# Patient Record
Sex: Female | Born: 1935 | Race: White | Hispanic: No | Smoking: Never smoker
Health system: Southern US, Community
[De-identification: ages and names within clinical notes are randomized; demographics above are authoritative.]

## PROBLEM LIST (undated history)

## (undated) DIAGNOSIS — I517 Cardiomegaly: Secondary | ICD-10-CM

## (undated) DIAGNOSIS — Z8719 Personal history of other diseases of the digestive system: Secondary | ICD-10-CM

## (undated) DIAGNOSIS — K649 Unspecified hemorrhoids: Secondary | ICD-10-CM

## (undated) DIAGNOSIS — C189 Malignant neoplasm of colon, unspecified: Secondary | ICD-10-CM

## (undated) DIAGNOSIS — I5189 Other ill-defined heart diseases: Secondary | ICD-10-CM

## (undated) DIAGNOSIS — Z87442 Personal history of urinary calculi: Secondary | ICD-10-CM

## (undated) DIAGNOSIS — I639 Cerebral infarction, unspecified: Secondary | ICD-10-CM

## (undated) DIAGNOSIS — R112 Nausea with vomiting, unspecified: Secondary | ICD-10-CM

## (undated) DIAGNOSIS — M810 Age-related osteoporosis without current pathological fracture: Secondary | ICD-10-CM

## (undated) DIAGNOSIS — E119 Type 2 diabetes mellitus without complications: Secondary | ICD-10-CM

## (undated) DIAGNOSIS — I2510 Atherosclerotic heart disease of native coronary artery without angina pectoris: Secondary | ICD-10-CM

## (undated) DIAGNOSIS — C801 Malignant (primary) neoplasm, unspecified: Secondary | ICD-10-CM

## (undated) DIAGNOSIS — Z9889 Other specified postprocedural states: Secondary | ICD-10-CM

## (undated) DIAGNOSIS — Z7901 Long term (current) use of anticoagulants: Secondary | ICD-10-CM

## (undated) DIAGNOSIS — T8859XA Other complications of anesthesia, initial encounter: Secondary | ICD-10-CM

## (undated) DIAGNOSIS — G459 Transient cerebral ischemic attack, unspecified: Secondary | ICD-10-CM

## (undated) DIAGNOSIS — I219 Acute myocardial infarction, unspecified: Secondary | ICD-10-CM

## (undated) DIAGNOSIS — I38 Endocarditis, valve unspecified: Secondary | ICD-10-CM

## (undated) DIAGNOSIS — Z8619 Personal history of other infectious and parasitic diseases: Secondary | ICD-10-CM

## (undated) DIAGNOSIS — E039 Hypothyroidism, unspecified: Secondary | ICD-10-CM

## (undated) DIAGNOSIS — E785 Hyperlipidemia, unspecified: Secondary | ICD-10-CM

## (undated) DIAGNOSIS — I10 Essential (primary) hypertension: Secondary | ICD-10-CM

## (undated) DIAGNOSIS — K219 Gastro-esophageal reflux disease without esophagitis: Secondary | ICD-10-CM

## (undated) DIAGNOSIS — T4145XA Adverse effect of unspecified anesthetic, initial encounter: Secondary | ICD-10-CM

## (undated) HISTORY — PX: CATARACT EXTRACTION, BILATERAL: SHX1313

## (undated) HISTORY — PX: BREAST BIOPSY: SHX20

## (undated) HISTORY — PX: CORONARY ANGIOPLASTY WITH STENT PLACEMENT: SHX49

## (undated) HISTORY — PX: APPENDECTOMY: SHX54

## (undated) HISTORY — PX: COLECTOMY: SHX59

---

## 1976-09-15 HISTORY — PX: AUGMENTATION MAMMAPLASTY: SUR837

## 2003-09-16 DIAGNOSIS — C189 Malignant neoplasm of colon, unspecified: Secondary | ICD-10-CM

## 2003-09-16 DIAGNOSIS — C801 Malignant (primary) neoplasm, unspecified: Secondary | ICD-10-CM

## 2003-09-16 HISTORY — DX: Malignant neoplasm of colon, unspecified: C18.9

## 2003-09-16 HISTORY — DX: Malignant (primary) neoplasm, unspecified: C80.1

## 2004-08-05 ENCOUNTER — Ambulatory Visit: Payer: Self-pay | Admitting: Oncology

## 2004-08-15 ENCOUNTER — Ambulatory Visit: Payer: Self-pay | Admitting: Oncology

## 2004-08-20 ENCOUNTER — Ambulatory Visit: Payer: Self-pay | Admitting: Internal Medicine

## 2004-09-15 ENCOUNTER — Ambulatory Visit: Payer: Self-pay | Admitting: Oncology

## 2005-02-03 ENCOUNTER — Ambulatory Visit: Payer: Self-pay | Admitting: Oncology

## 2005-02-13 ENCOUNTER — Ambulatory Visit: Payer: Self-pay | Admitting: Oncology

## 2005-03-15 ENCOUNTER — Ambulatory Visit: Payer: Self-pay | Admitting: Oncology

## 2005-04-09 ENCOUNTER — Ambulatory Visit: Payer: Self-pay | Admitting: Gastroenterology

## 2005-05-07 ENCOUNTER — Ambulatory Visit: Payer: Self-pay | Admitting: Oncology

## 2005-08-13 ENCOUNTER — Ambulatory Visit: Payer: Self-pay | Admitting: Oncology

## 2005-08-15 ENCOUNTER — Ambulatory Visit: Payer: Self-pay | Admitting: Oncology

## 2005-08-25 ENCOUNTER — Ambulatory Visit: Payer: Self-pay | Admitting: Internal Medicine

## 2005-08-26 ENCOUNTER — Emergency Department: Payer: Self-pay | Admitting: Emergency Medicine

## 2005-08-28 ENCOUNTER — Ambulatory Visit: Payer: Self-pay | Admitting: Orthopedic Surgery

## 2006-02-06 ENCOUNTER — Ambulatory Visit: Payer: Self-pay | Admitting: Oncology

## 2006-02-13 ENCOUNTER — Ambulatory Visit: Payer: Self-pay | Admitting: Oncology

## 2006-02-17 ENCOUNTER — Ambulatory Visit: Payer: Self-pay | Admitting: Orthopedic Surgery

## 2006-02-17 ENCOUNTER — Other Ambulatory Visit: Payer: Self-pay

## 2006-02-27 ENCOUNTER — Ambulatory Visit: Payer: Self-pay | Admitting: Orthopedic Surgery

## 2006-06-22 ENCOUNTER — Inpatient Hospital Stay: Payer: Self-pay | Admitting: Internal Medicine

## 2006-07-02 ENCOUNTER — Encounter: Payer: Self-pay | Admitting: Internal Medicine

## 2006-07-16 ENCOUNTER — Encounter: Payer: Self-pay | Admitting: Internal Medicine

## 2006-07-22 ENCOUNTER — Ambulatory Visit: Payer: Self-pay | Admitting: Unknown Physician Specialty

## 2006-08-05 ENCOUNTER — Ambulatory Visit: Payer: Self-pay | Admitting: Oncology

## 2006-08-15 ENCOUNTER — Ambulatory Visit: Payer: Self-pay | Admitting: Oncology

## 2006-08-26 ENCOUNTER — Ambulatory Visit: Payer: Self-pay | Admitting: Internal Medicine

## 2007-01-22 ENCOUNTER — Ambulatory Visit: Payer: Self-pay | Admitting: Internal Medicine

## 2007-02-05 ENCOUNTER — Ambulatory Visit: Payer: Self-pay | Admitting: Unknown Physician Specialty

## 2007-02-05 ENCOUNTER — Other Ambulatory Visit: Payer: Self-pay

## 2007-02-12 ENCOUNTER — Ambulatory Visit: Payer: Self-pay | Admitting: Unknown Physician Specialty

## 2007-02-14 ENCOUNTER — Ambulatory Visit: Payer: Self-pay | Admitting: Oncology

## 2007-02-16 ENCOUNTER — Ambulatory Visit: Payer: Self-pay | Admitting: Oncology

## 2007-02-25 ENCOUNTER — Encounter: Payer: Self-pay | Admitting: Unknown Physician Specialty

## 2007-03-16 ENCOUNTER — Ambulatory Visit: Payer: Self-pay | Admitting: Oncology

## 2007-03-16 ENCOUNTER — Encounter: Payer: Self-pay | Admitting: Unknown Physician Specialty

## 2007-04-16 ENCOUNTER — Encounter: Payer: Self-pay | Admitting: Unknown Physician Specialty

## 2007-04-25 ENCOUNTER — Observation Stay: Payer: Self-pay | Admitting: Internal Medicine

## 2007-04-25 ENCOUNTER — Other Ambulatory Visit: Payer: Self-pay

## 2007-05-17 ENCOUNTER — Encounter: Payer: Self-pay | Admitting: Unknown Physician Specialty

## 2007-08-16 ENCOUNTER — Ambulatory Visit: Payer: Self-pay | Admitting: Oncology

## 2007-08-18 ENCOUNTER — Ambulatory Visit: Payer: Self-pay | Admitting: Oncology

## 2007-09-16 ENCOUNTER — Ambulatory Visit: Payer: Self-pay | Admitting: Oncology

## 2007-09-27 ENCOUNTER — Ambulatory Visit: Payer: Self-pay | Admitting: Internal Medicine

## 2007-11-01 ENCOUNTER — Ambulatory Visit: Payer: Self-pay | Admitting: Internal Medicine

## 2007-11-10 ENCOUNTER — Ambulatory Visit: Payer: Self-pay | Admitting: Unknown Physician Specialty

## 2008-02-14 ENCOUNTER — Ambulatory Visit: Payer: Self-pay | Admitting: Oncology

## 2008-02-16 ENCOUNTER — Ambulatory Visit: Payer: Self-pay | Admitting: Oncology

## 2008-02-24 ENCOUNTER — Ambulatory Visit: Payer: Self-pay | Admitting: Internal Medicine

## 2008-03-15 ENCOUNTER — Ambulatory Visit: Payer: Self-pay | Admitting: Oncology

## 2008-08-15 ENCOUNTER — Ambulatory Visit: Payer: Self-pay | Admitting: Oncology

## 2008-08-23 ENCOUNTER — Ambulatory Visit: Payer: Self-pay | Admitting: Oncology

## 2008-09-15 ENCOUNTER — Ambulatory Visit: Payer: Self-pay | Admitting: Oncology

## 2008-10-02 ENCOUNTER — Ambulatory Visit: Payer: Self-pay | Admitting: Internal Medicine

## 2008-12-26 ENCOUNTER — Ambulatory Visit: Payer: Self-pay | Admitting: Ophthalmology

## 2009-01-09 ENCOUNTER — Ambulatory Visit: Payer: Self-pay | Admitting: Ophthalmology

## 2009-01-23 ENCOUNTER — Ambulatory Visit: Payer: Self-pay | Admitting: Unknown Physician Specialty

## 2009-02-22 ENCOUNTER — Ambulatory Visit: Payer: Self-pay | Admitting: Internal Medicine

## 2009-03-23 ENCOUNTER — Ambulatory Visit: Payer: Self-pay | Admitting: Ophthalmology

## 2009-08-15 ENCOUNTER — Ambulatory Visit: Payer: Self-pay | Admitting: Oncology

## 2009-09-15 ENCOUNTER — Ambulatory Visit: Payer: Self-pay | Admitting: Oncology

## 2009-10-03 ENCOUNTER — Ambulatory Visit: Payer: Self-pay | Admitting: Internal Medicine

## 2010-01-29 ENCOUNTER — Ambulatory Visit: Payer: Self-pay | Admitting: Unknown Physician Specialty

## 2010-08-23 ENCOUNTER — Ambulatory Visit: Payer: Self-pay | Admitting: Oncology

## 2010-08-26 LAB — CEA: CEA: 1.6 ng/mL (ref 0.0–4.7)

## 2010-09-15 ENCOUNTER — Ambulatory Visit: Payer: Self-pay | Admitting: Oncology

## 2010-10-07 ENCOUNTER — Ambulatory Visit: Payer: Self-pay | Admitting: Internal Medicine

## 2011-04-22 ENCOUNTER — Ambulatory Visit: Payer: Self-pay | Admitting: Unknown Physician Specialty

## 2011-08-25 ENCOUNTER — Ambulatory Visit: Payer: Self-pay | Admitting: Oncology

## 2011-08-26 LAB — CEA: CEA: 1.4 ng/mL (ref 0.0–4.7)

## 2011-09-16 ENCOUNTER — Ambulatory Visit: Payer: Self-pay | Admitting: Oncology

## 2011-11-24 ENCOUNTER — Ambulatory Visit: Payer: Self-pay | Admitting: Internal Medicine

## 2012-07-19 ENCOUNTER — Ambulatory Visit: Payer: Self-pay | Admitting: Unknown Physician Specialty

## 2012-08-24 ENCOUNTER — Ambulatory Visit: Payer: Self-pay | Admitting: Oncology

## 2012-08-24 LAB — CBC CANCER CENTER
Basophil #: 0.1 x10 3/mm (ref 0.0–0.1)
Eosinophil #: 0.2 x10 3/mm (ref 0.0–0.7)
HGB: 16.1 g/dL — ABNORMAL HIGH (ref 12.0–16.0)
Lymphocyte #: 2.3 x10 3/mm (ref 1.0–3.6)
Lymphocyte %: 37.7 %
MCHC: 34.9 g/dL (ref 32.0–36.0)
Monocyte #: 0.4 x10 3/mm (ref 0.2–0.9)
Monocyte %: 7.1 %
Neutrophil #: 3.1 x10 3/mm (ref 1.4–6.5)
Neutrophil %: 51.2 %
Platelet: 154 x10 3/mm (ref 150–440)
RBC: 4.92 10*6/uL (ref 3.80–5.20)
WBC: 6.1 x10 3/mm (ref 3.6–11.0)

## 2012-08-24 LAB — COMPREHENSIVE METABOLIC PANEL
Alkaline Phosphatase: 72 U/L (ref 50–136)
BUN: 15 mg/dL (ref 7–18)
Bilirubin,Total: 0.4 mg/dL (ref 0.2–1.0)
Chloride: 105 mmol/L (ref 98–107)
Creatinine: 0.95 mg/dL (ref 0.60–1.30)
EGFR (African American): >60
EGFR (Non-African Amer.): 57 — ABNORMAL LOW
SGOT(AST): 22 U/L (ref 15–37)
SGPT (ALT): 45 U/L (ref 12–78)
Total Protein: 7.3 g/dL (ref 6.4–8.2)

## 2012-08-25 LAB — CEA: CEA: 1.8 ng/mL (ref 0.0–4.7)

## 2012-09-15 ENCOUNTER — Ambulatory Visit: Payer: Self-pay | Admitting: Oncology

## 2012-11-07 ENCOUNTER — Emergency Department: Payer: Self-pay | Admitting: Emergency Medicine

## 2012-11-07 LAB — URINALYSIS, COMPLETE
Bacteria: NONE SEEN
Bilirubin,UR: NEGATIVE
Glucose,UR: >=500 mg/dL (ref 0–75)
Ketone: NEGATIVE
Leukocyte Esterase: NEGATIVE
Nitrite: NEGATIVE
Specific Gravity: 1.008 (ref 1.003–1.030)
Squamous Epithelial: <1
WBC UR: 1 /HPF (ref 0–5)

## 2012-11-07 LAB — COMPREHENSIVE METABOLIC PANEL
Albumin: 4.5 g/dL (ref 3.4–5.0)
Co2: 24 mmol/L (ref 21–32)
EGFR (African American): 51 — ABNORMAL LOW
EGFR (Non-African Amer.): 44 — ABNORMAL LOW
Glucose: 227 mg/dL — ABNORMAL HIGH (ref 65–99)
Osmolality: 283 (ref 275–301)
SGPT (ALT): 50 U/L (ref 12–78)
Sodium: 137 mmol/L (ref 136–145)
Total Protein: 7.8 g/dL (ref 6.4–8.2)

## 2012-11-07 LAB — CBC WITH DIFFERENTIAL/PLATELET
Basophil %: 0.8 %
Eosinophil #: 0.1 10*3/uL (ref 0.0–0.7)
Eosinophil %: 0.8 %
Lymphocyte %: 16.6 %
MCH: 31.5 pg (ref 26.0–34.0)
MCV: 94 fL (ref 80–100)
Monocyte #: 0.9 x10 3/mm (ref 0.2–0.9)
Monocyte %: 9.6 %
Neutrophil #: 6.5 10*3/uL (ref 1.4–6.5)
RDW: 13.5 % (ref 11.5–14.5)
WBC: 9 10*3/uL (ref 3.6–11.0)

## 2012-11-07 LAB — LIPASE, BLOOD: Lipase: 226 U/L (ref 73–393)

## 2012-11-16 ENCOUNTER — Ambulatory Visit: Payer: Self-pay | Admitting: Urology

## 2012-12-10 ENCOUNTER — Ambulatory Visit: Payer: Self-pay | Admitting: Urology

## 2012-12-14 ENCOUNTER — Ambulatory Visit: Payer: Self-pay | Admitting: Internal Medicine

## 2013-08-24 ENCOUNTER — Ambulatory Visit: Payer: Self-pay | Admitting: Urology

## 2014-01-24 ENCOUNTER — Observation Stay: Payer: Self-pay | Admitting: Internal Medicine

## 2014-01-24 LAB — PROTIME-INR
INR: 1
Prothrombin Time: 12.8 secs (ref 11.5–14.7)

## 2014-01-24 LAB — CBC
HCT: 41.9 % (ref 35.0–47.0)
HGB: 14.5 g/dL (ref 12.0–16.0)
MCH: 32.6 pg (ref 26.0–34.0)
MCHC: 34.6 g/dL (ref 32.0–36.0)
MCV: 94 fL (ref 80–100)
Platelet: 138 10*3/uL — ABNORMAL LOW (ref 150–440)
RBC: 4.44 10*6/uL (ref 3.80–5.20)
RDW: 13.2 % (ref 11.5–14.5)
WBC: 7.6 10*3/uL (ref 3.6–11.0)

## 2014-01-24 LAB — MAGNESIUM: MAGNESIUM: 1.8 mg/dL

## 2014-01-24 LAB — BASIC METABOLIC PANEL
Anion Gap: 9 (ref 7–16)
BUN: 13 mg/dL (ref 7–18)
CALCIUM: 9.8 mg/dL (ref 8.5–10.1)
CO2: 25 mmol/L (ref 21–32)
CREATININE: 1.33 mg/dL — AB (ref 0.60–1.30)
Chloride: 113 mmol/L — ABNORMAL HIGH (ref 98–107)
EGFR (African American): 43 — ABNORMAL LOW
EGFR (Non-African Amer.): 37 — ABNORMAL LOW
Glucose: 147 mg/dL — ABNORMAL HIGH (ref 65–99)
Osmolality: 295 (ref 275–301)
Potassium: 4 mmol/L (ref 3.5–5.1)
SODIUM: 147 mmol/L — AB (ref 136–145)

## 2014-01-24 LAB — APTT: ACTIVATED PTT: 23.6 s (ref 23.6–35.9)

## 2014-01-24 LAB — TROPONIN I: Troponin-I: <0.02 ng/mL

## 2014-01-24 LAB — LIPASE, BLOOD: Lipase: 256 U/L (ref 73–393)

## 2014-01-24 LAB — PRO B NATRIURETIC PEPTIDE: B-Type Natriuretic Peptide: 236 pg/mL (ref 0–450)

## 2014-01-25 LAB — CK TOTAL AND CKMB (NOT AT ARMC)
CK, Total: 124 U/L
CK, Total: 116 U/L
CK-MB: 2.1 ng/mL (ref 0.5–3.6)
CK-MB: 1.6 ng/mL (ref 0.5–3.6)

## 2014-01-25 LAB — LIPID PANEL
Cholesterol: 136 mg/dL (ref 0–200)
HDL Cholesterol: 34 mg/dL — ABNORMAL LOW (ref 40–60)
Ldl Cholesterol, Calc: 58 mg/dL (ref 0–100)
TRIGLYCERIDES: 219 mg/dL — AB (ref 0–200)
VLDL CHOLESTEROL, CALC: 44 mg/dL — AB (ref 5–40)

## 2014-01-25 LAB — TROPONIN I
Troponin-I: <0.02 ng/mL
Troponin-I: <0.02 ng/mL

## 2014-02-04 ENCOUNTER — Inpatient Hospital Stay: Payer: Self-pay | Admitting: Internal Medicine

## 2014-02-04 LAB — URINALYSIS, COMPLETE
BACTERIA: NONE SEEN
BILIRUBIN, UR: NEGATIVE
BLOOD: NEGATIVE
Glucose,UR: NEGATIVE mg/dL (ref 0–75)
Ketone: NEGATIVE
Leukocyte Esterase: NEGATIVE
NITRITE: NEGATIVE
PH: 7 (ref 4.5–8.0)
PROTEIN: NEGATIVE
SPECIFIC GRAVITY: 1.032 (ref 1.003–1.030)
Squamous Epithelial: NONE SEEN
WBC UR: 1 /HPF (ref 0–5)

## 2014-02-04 LAB — CBC WITH DIFFERENTIAL/PLATELET
BASOS ABS: 0.1 10*3/uL (ref 0.0–0.1)
BASOS PCT: 0.7 %
EOS ABS: 0.2 10*3/uL (ref 0.0–0.7)
EOS PCT: 1.7 %
HCT: 47.2 % — AB (ref 35.0–47.0)
HGB: 16 g/dL (ref 12.0–16.0)
LYMPHS ABS: 2.4 10*3/uL (ref 1.0–3.6)
Lymphocyte %: 25.5 %
MCH: 32.5 pg (ref 26.0–34.0)
MCHC: 34 g/dL (ref 32.0–36.0)
MCV: 96 fL (ref 80–100)
MONOS PCT: 8.2 %
Monocyte #: 0.8 x10 3/mm (ref 0.2–0.9)
NEUTROS ABS: 5.9 10*3/uL (ref 1.4–6.5)
NEUTROS PCT: 63.9 %
Platelet: 200 10*3/uL (ref 150–440)
RBC: 4.93 10*6/uL (ref 3.80–5.20)
RDW: 13.7 % (ref 11.5–14.5)
WBC: 9.3 10*3/uL (ref 3.6–11.0)

## 2014-02-04 LAB — BASIC METABOLIC PANEL
Anion Gap: 12 (ref 7–16)
BUN: 13 mg/dL (ref 7–18)
CALCIUM: 10.4 mg/dL — AB (ref 8.5–10.1)
CHLORIDE: 103 mmol/L (ref 98–107)
CO2: 20 mmol/L — AB (ref 21–32)
CREATININE: 0.78 mg/dL (ref 0.60–1.30)
EGFR (African American): >60
GLUCOSE: 101 mg/dL — AB (ref 65–99)
OSMOLALITY: 270 (ref 275–301)
POTASSIUM: 5.1 mmol/L (ref 3.5–5.1)
SODIUM: 135 mmol/L — AB (ref 136–145)

## 2014-02-04 LAB — TROPONIN I: Troponin-I: <0.02 ng/mL

## 2014-02-05 ENCOUNTER — Ambulatory Visit: Payer: Self-pay | Admitting: Orthopaedic Surgery

## 2014-02-05 LAB — CBC WITH DIFFERENTIAL/PLATELET
BASOS PCT: 0.7 %
Basophil #: 0 10*3/uL (ref 0.0–0.1)
EOS ABS: 0.2 10*3/uL (ref 0.0–0.7)
Eosinophil %: 2.5 %
HCT: 37.9 % (ref 35.0–47.0)
HGB: 13 g/dL (ref 12.0–16.0)
LYMPHS ABS: 1.6 10*3/uL (ref 1.0–3.6)
Lymphocyte %: 26.6 %
MCH: 32.3 pg (ref 26.0–34.0)
MCHC: 34.1 g/dL (ref 32.0–36.0)
MCV: 95 fL (ref 80–100)
Monocyte #: 0.5 x10 3/mm (ref 0.2–0.9)
Monocyte %: 8.7 %
Neutrophil #: 3.8 10*3/uL (ref 1.4–6.5)
Neutrophil %: 61.5 %
Platelet: 178 10*3/uL (ref 150–440)
RBC: 4.01 10*6/uL (ref 3.80–5.20)
RDW: 13.7 % (ref 11.5–14.5)
WBC: 6.2 10*3/uL (ref 3.6–11.0)

## 2014-02-05 LAB — HEPATIC FUNCTION PANEL A (ARMC)
Albumin: 3.3 g/dL — ABNORMAL LOW (ref 3.4–5.0)
Alkaline Phosphatase: 65 U/L
BILIRUBIN TOTAL: 0.3 mg/dL (ref 0.2–1.0)
Bilirubin, Direct: 0.1 mg/dL (ref 0.00–0.20)
SGOT(AST): 16 U/L (ref 15–37)
SGPT (ALT): 27 U/L (ref 12–78)
Total Protein: 5.8 g/dL — ABNORMAL LOW (ref 6.4–8.2)

## 2014-02-05 LAB — BASIC METABOLIC PANEL
ANION GAP: 5 — AB (ref 7–16)
BUN: 10 mg/dL (ref 7–18)
CALCIUM: 8.5 mg/dL (ref 8.5–10.1)
Chloride: 106 mmol/L (ref 98–107)
Co2: 26 mmol/L (ref 21–32)
Creatinine: 0.85 mg/dL (ref 0.60–1.30)
EGFR (Non-African Amer.): >60
Glucose: 121 mg/dL — ABNORMAL HIGH (ref 65–99)
OSMOLALITY: 274 (ref 275–301)
POTASSIUM: 4.2 mmol/L (ref 3.5–5.1)
Sodium: 137 mmol/L (ref 136–145)

## 2014-02-07 LAB — BASIC METABOLIC PANEL
ANION GAP: 6 — AB (ref 7–16)
BUN: 3 mg/dL — ABNORMAL LOW (ref 7–18)
CO2: 25 mmol/L (ref 21–32)
CREATININE: 0.86 mg/dL (ref 0.60–1.30)
Calcium, Total: 8.7 mg/dL (ref 8.5–10.1)
Chloride: 105 mmol/L (ref 98–107)
Glucose: 119 mg/dL — ABNORMAL HIGH (ref 65–99)
Osmolality: 270 (ref 275–301)
Potassium: 3.3 mmol/L — ABNORMAL LOW (ref 3.5–5.1)
SODIUM: 136 mmol/L (ref 136–145)

## 2014-02-07 LAB — CBC WITH DIFFERENTIAL/PLATELET
Basophil #: 0.1 10*3/uL (ref 0.0–0.1)
Basophil %: 0.8 %
Eosinophil #: 0.3 10*3/uL (ref 0.0–0.7)
Eosinophil %: 4.2 %
HCT: 42.5 % (ref 35.0–47.0)
HGB: 14.7 g/dL (ref 12.0–16.0)
LYMPHS PCT: 40.1 %
Lymphocyte #: 2.9 10*3/uL (ref 1.0–3.6)
MCH: 32.5 pg (ref 26.0–34.0)
MCHC: 34.6 g/dL (ref 32.0–36.0)
MCV: 94 fL (ref 80–100)
MONOS PCT: 8.2 %
Monocyte #: 0.6 x10 3/mm (ref 0.2–0.9)
NEUTROS PCT: 46.7 %
Neutrophil #: 3.4 10*3/uL (ref 1.4–6.5)
Platelet: 208 10*3/uL (ref 150–440)
RBC: 4.52 10*6/uL (ref 3.80–5.20)
RDW: 13.3 % (ref 11.5–14.5)
WBC: 7.3 10*3/uL (ref 3.6–11.0)

## 2014-02-08 ENCOUNTER — Encounter: Payer: Self-pay | Admitting: Internal Medicine

## 2014-02-08 LAB — BASIC METABOLIC PANEL
Anion Gap: 8 (ref 7–16)
BUN: 8 mg/dL (ref 7–18)
CHLORIDE: 108 mmol/L — AB (ref 98–107)
Calcium, Total: 9 mg/dL (ref 8.5–10.1)
Co2: 21 mmol/L (ref 21–32)
Creatinine: 0.75 mg/dL (ref 0.60–1.30)
EGFR (African American): >60
Glucose: 103 mg/dL — ABNORMAL HIGH (ref 65–99)
Osmolality: 272 (ref 275–301)
Potassium: 3.6 mmol/L (ref 3.5–5.1)
SODIUM: 137 mmol/L (ref 136–145)

## 2014-02-08 LAB — CBC WITH DIFFERENTIAL/PLATELET
BASOS ABS: 0 10*3/uL (ref 0.0–0.1)
Basophil %: 0.5 %
EOS PCT: 0.2 %
Eosinophil #: 0 10*3/uL (ref 0.0–0.7)
HCT: 42.1 % (ref 35.0–47.0)
HGB: 14.6 g/dL (ref 12.0–16.0)
LYMPHS ABS: 2.4 10*3/uL (ref 1.0–3.6)
LYMPHS PCT: 30.6 %
MCH: 32.5 pg (ref 26.0–34.0)
MCHC: 34.7 g/dL (ref 32.0–36.0)
MCV: 94 fL (ref 80–100)
MONOS PCT: 9.1 %
Monocyte #: 0.7 x10 3/mm (ref 0.2–0.9)
NEUTROS ABS: 4.7 10*3/uL (ref 1.4–6.5)
Neutrophil %: 59.6 %
Platelet: 201 10*3/uL (ref 150–440)
RBC: 4.5 10*6/uL (ref 3.80–5.20)
RDW: 13.4 % (ref 11.5–14.5)
WBC: 7.9 10*3/uL (ref 3.6–11.0)

## 2014-02-13 ENCOUNTER — Encounter: Payer: Self-pay | Admitting: Internal Medicine

## 2014-02-21 ENCOUNTER — Other Ambulatory Visit: Payer: Self-pay | Admitting: Pain Medicine

## 2014-02-21 LAB — MAGNESIUM: Magnesium: 1.9 mg/dL

## 2014-02-21 LAB — SEDIMENTATION RATE: Erythrocyte Sed Rate: 8 mm/hr (ref 0–30)

## 2014-02-21 LAB — HEPATIC FUNCTION PANEL A (ARMC)
ALT: 32 U/L (ref 12–78)
Albumin: 3.8 g/dL (ref 3.4–5.0)
Alkaline Phosphatase: 72 U/L
BILIRUBIN DIRECT: 0.1 mg/dL (ref 0.00–0.20)
BILIRUBIN TOTAL: 0.6 mg/dL (ref 0.2–1.0)
SGOT(AST): 31 U/L (ref 15–37)
Total Protein: 6.9 g/dL (ref 6.4–8.2)

## 2014-02-21 LAB — CBC WITH DIFFERENTIAL/PLATELET
Basophil #: 0.1 10*3/uL (ref 0.0–0.1)
Basophil %: 0.6 %
Eosinophil #: 0.1 10*3/uL (ref 0.0–0.7)
Eosinophil %: 0.9 %
HCT: 45.6 % (ref 35.0–47.0)
HGB: 15.1 g/dL (ref 12.0–16.0)
LYMPHS ABS: 2.9 10*3/uL (ref 1.0–3.6)
Lymphocyte %: 26.2 %
MCH: 31.1 pg (ref 26.0–34.0)
MCHC: 33.1 g/dL (ref 32.0–36.0)
MCV: 94 fL (ref 80–100)
Monocyte #: 1 x10 3/mm — ABNORMAL HIGH (ref 0.2–0.9)
Monocyte %: 8.8 %
Neutrophil #: 7.1 10*3/uL — ABNORMAL HIGH (ref 1.4–6.5)
Neutrophil %: 63.5 %
Platelet: 177 10*3/uL (ref 150–440)
RBC: 4.85 10*6/uL (ref 3.80–5.20)
RDW: 13.5 % (ref 11.5–14.5)
WBC: 11.2 10*3/uL — ABNORMAL HIGH (ref 3.6–11.0)

## 2014-02-21 LAB — BASIC METABOLIC PANEL
ANION GAP: 9 (ref 7–16)
BUN: 14 mg/dL (ref 7–18)
CO2: 23 mmol/L (ref 21–32)
Calcium, Total: 9.3 mg/dL (ref 8.5–10.1)
Chloride: 107 mmol/L (ref 98–107)
Creatinine: 0.87 mg/dL (ref 0.60–1.30)
EGFR (Non-African Amer.): >60
GLUCOSE: 117 mg/dL — AB (ref 65–99)
Osmolality: 279 (ref 275–301)
Potassium: 3.8 mmol/L (ref 3.5–5.1)
Sodium: 139 mmol/L (ref 136–145)

## 2014-02-21 LAB — PROTIME-INR
INR: 1
Prothrombin Time: 13.5 secs (ref 11.5–14.7)

## 2014-02-22 DIAGNOSIS — S22060A Wedge compression fracture of T7-T8 vertebra, initial encounter for closed fracture: Secondary | ICD-10-CM | POA: Insufficient documentation

## 2014-02-23 ENCOUNTER — Ambulatory Visit: Payer: Self-pay | Admitting: Cardiology

## 2014-02-23 HISTORY — PX: CORONARY ANGIOPLASTY WITH STENT PLACEMENT: SHX49

## 2014-02-23 LAB — CK-MB: CK-MB: 1.6 ng/mL (ref 0.5–3.6)

## 2014-02-24 LAB — BASIC METABOLIC PANEL
Anion Gap: 5 — ABNORMAL LOW (ref 7–16)
BUN: 11 mg/dL (ref 7–18)
CREATININE: 0.76 mg/dL (ref 0.60–1.30)
Calcium, Total: 8.3 mg/dL — ABNORMAL LOW (ref 8.5–10.1)
Chloride: 110 mmol/L — ABNORMAL HIGH (ref 98–107)
Co2: 24 mmol/L (ref 21–32)
EGFR (African American): >60
EGFR (Non-African Amer.): >60
Glucose: 115 mg/dL — ABNORMAL HIGH (ref 65–99)
Osmolality: 278 (ref 275–301)
Potassium: 4.1 mmol/L (ref 3.5–5.1)
SODIUM: 139 mmol/L (ref 136–145)

## 2014-03-03 DIAGNOSIS — I2510 Atherosclerotic heart disease of native coronary artery without angina pectoris: Secondary | ICD-10-CM | POA: Insufficient documentation

## 2014-03-08 DIAGNOSIS — M549 Dorsalgia, unspecified: Secondary | ICD-10-CM

## 2014-03-08 DIAGNOSIS — IMO0002 Reserved for concepts with insufficient information to code with codable children: Secondary | ICD-10-CM | POA: Insufficient documentation

## 2014-03-08 DIAGNOSIS — G8929 Other chronic pain: Secondary | ICD-10-CM | POA: Insufficient documentation

## 2014-04-01 ENCOUNTER — Emergency Department: Payer: Self-pay | Admitting: Emergency Medicine

## 2014-04-01 LAB — URINALYSIS, COMPLETE
BACTERIA: NONE SEEN
BILIRUBIN, UR: NEGATIVE
Glucose,UR: >=500 mg/dL (ref 0–75)
Leukocyte Esterase: NEGATIVE
Nitrite: NEGATIVE
Ph: 8 (ref 4.5–8.0)
RBC,UR: 182 /HPF (ref 0–5)
SPECIFIC GRAVITY: 1.006 (ref 1.003–1.030)
Squamous Epithelial: <1

## 2014-04-01 LAB — CBC WITH DIFFERENTIAL/PLATELET
BASOS ABS: 0.1 10*3/uL (ref 0.0–0.1)
BASOS PCT: 0.7 %
EOS ABS: 0.1 10*3/uL (ref 0.0–0.7)
Eosinophil %: 0.4 %
HCT: 45.7 % (ref 35.0–47.0)
HGB: 15.3 g/dL (ref 12.0–16.0)
Lymphocyte #: 1.2 10*3/uL (ref 1.0–3.6)
Lymphocyte %: 8.8 %
MCH: 32.3 pg (ref 26.0–34.0)
MCHC: 33.4 g/dL (ref 32.0–36.0)
MCV: 97 fL (ref 80–100)
Monocyte #: 0.6 x10 3/mm (ref 0.2–0.9)
Monocyte %: 4.6 %
NEUTROS ABS: 12.1 10*3/uL — AB (ref 1.4–6.5)
Neutrophil %: 85.5 %
PLATELETS: 185 10*3/uL (ref 150–440)
RBC: 4.72 10*6/uL (ref 3.80–5.20)
RDW: 14.9 % — ABNORMAL HIGH (ref 11.5–14.5)
WBC: 14.1 10*3/uL — ABNORMAL HIGH (ref 3.6–11.0)

## 2014-04-01 LAB — COMPREHENSIVE METABOLIC PANEL
ALBUMIN: 4.4 g/dL (ref 3.4–5.0)
ALK PHOS: 70 U/L
ANION GAP: 13 (ref 7–16)
BUN: 10 mg/dL (ref 7–18)
Bilirubin,Total: 0.6 mg/dL (ref 0.2–1.0)
CO2: 20 mmol/L — AB (ref 21–32)
Calcium, Total: 9.4 mg/dL (ref 8.5–10.1)
Chloride: 103 mmol/L (ref 98–107)
Creatinine: 0.85 mg/dL (ref 0.60–1.30)
EGFR (African American): >60
EGFR (Non-African Amer.): >60
Glucose: 214 mg/dL — ABNORMAL HIGH (ref 65–99)
OSMOLALITY: 277 (ref 275–301)
Potassium: 4.1 mmol/L (ref 3.5–5.1)
SGOT(AST): 31 U/L (ref 15–37)
SGPT (ALT): 30 U/L (ref 12–78)
Sodium: 136 mmol/L (ref 136–145)
Total Protein: 8 g/dL (ref 6.4–8.2)

## 2014-04-01 LAB — LIPASE, BLOOD: LIPASE: 261 U/L (ref 73–393)

## 2014-04-03 ENCOUNTER — Ambulatory Visit: Payer: Self-pay | Admitting: Orthopedic Surgery

## 2014-04-10 ENCOUNTER — Other Ambulatory Visit: Payer: Self-pay | Admitting: Urology

## 2014-04-11 ENCOUNTER — Ambulatory Visit: Payer: Self-pay | Admitting: Orthopedic Surgery

## 2014-04-18 DIAGNOSIS — Z8619 Personal history of other infectious and parasitic diseases: Secondary | ICD-10-CM

## 2014-04-18 HISTORY — DX: Personal history of other infectious and parasitic diseases: Z86.19

## 2014-05-03 ENCOUNTER — Encounter (HOSPITAL_COMMUNITY): Payer: Self-pay | Admitting: Pharmacy Technician

## 2014-05-08 ENCOUNTER — Encounter (HOSPITAL_COMMUNITY): Payer: Self-pay

## 2014-05-08 ENCOUNTER — Encounter (HOSPITAL_COMMUNITY)
Admission: RE | Admit: 2014-05-08 | Discharge: 2014-05-08 | Disposition: A | Discharge status: Home / Self Care | Payer: Medicare Other | Source: Ambulatory Visit | Attending: Urology | Admitting: Urology

## 2014-05-08 DIAGNOSIS — N200 Calculus of kidney: Secondary | ICD-10-CM | POA: Insufficient documentation

## 2014-05-08 DIAGNOSIS — N201 Calculus of ureter: Secondary | ICD-10-CM | POA: Insufficient documentation

## 2014-05-08 DIAGNOSIS — Z01818 Encounter for other preprocedural examination: Secondary | ICD-10-CM | POA: Diagnosis present

## 2014-05-08 HISTORY — DX: Acute myocardial infarction, unspecified: I21.9

## 2014-05-08 HISTORY — DX: Personal history of other infectious and parasitic diseases: Z86.19

## 2014-05-08 HISTORY — DX: Personal history of other diseases of the digestive system: Z87.19

## 2014-05-08 HISTORY — DX: Unspecified hemorrhoids: K64.9

## 2014-05-08 HISTORY — DX: Adverse effect of unspecified anesthetic, initial encounter: T41.45XA

## 2014-05-08 HISTORY — DX: Nausea with vomiting, unspecified: Z98.890

## 2014-05-08 HISTORY — DX: Malignant (primary) neoplasm, unspecified: C80.1

## 2014-05-08 HISTORY — DX: Nausea with vomiting, unspecified: R11.2

## 2014-05-08 HISTORY — DX: Personal history of urinary calculi: Z87.442

## 2014-05-08 HISTORY — DX: Essential (primary) hypertension: I10

## 2014-05-08 HISTORY — DX: Gastro-esophageal reflux disease without esophagitis: K21.9

## 2014-05-08 HISTORY — DX: Other complications of anesthesia, initial encounter: T88.59XA

## 2014-05-08 LAB — BASIC METABOLIC PANEL
ANION GAP: 10 (ref 5–15)
BUN: 9 mg/dL (ref 6–23)
CO2: 26 meq/L (ref 19–32)
Calcium: 9.7 mg/dL (ref 8.4–10.5)
Chloride: 105 mEq/L (ref 96–112)
Creatinine, Ser: 0.78 mg/dL (ref 0.50–1.10)
GFR calc Af Amer: 88 mL/min — ABNORMAL LOW (ref >90)
GFR calc non Af Amer: 76 mL/min — ABNORMAL LOW (ref >90)
GLUCOSE: 93 mg/dL (ref 70–99)
Potassium: 5 mEq/L (ref 3.7–5.3)
SODIUM: 141 meq/L (ref 137–147)

## 2014-05-08 LAB — CBC
HEMATOCRIT: 40.8 % (ref 36.0–46.0)
HEMOGLOBIN: 13.6 g/dL (ref 12.0–15.0)
MCH: 31.6 pg (ref 26.0–34.0)
MCHC: 33.3 g/dL (ref 30.0–36.0)
MCV: 94.9 fL (ref 78.0–100.0)
Platelets: 176 10*3/uL (ref 150–400)
RBC: 4.3 MIL/uL (ref 3.87–5.11)
RDW: 14.3 % (ref 11.5–15.5)
WBC: 6.3 10*3/uL (ref 4.0–10.5)

## 2014-05-08 NOTE — Patient Instructions (Addendum)
20 Rachael Humphrey  05/08/2014   Your procedure is scheduled on:  9-2 -2015  Wednesday  Enter through [facility] Entrance and follow signs to [facility]. Arrive at   1100     AM.  Call this number if you have problems the morning of surgery: [phone]  Or Presurgical Testing [phone].   For Living Will and/or Health Care Power Attorney Forms: please provide copy for your medical record,may bring AM of surgery(Forms should be already notarized -we do not provide this service).(05-08-14 Yes information was provided to [facility] previous visit).      Do not eat food/ or drink: After Midnight.  Exception: may have clear liquids:up to 6 Hours before arrival. Nothing after: 0700 AM.  Clear liquids include soda, tea, black coffee, apple or grape juice, broth.  Take these medicines the morning of surgery with A SIP OF WATER: Cetirizine. Hydrocodone(if needed), Isosorbide.Metoprolol. Nifedipine. Pantoprazole. Use Aspirin and Plavix as per Doctor instructions.    Do not wear jewelry, make-up or nail polish.  Do not wear lotions, powders, or perfumes. You may wear deodorant.  Do not shave 48 hours(2 days) prior to first CHG shower(legs and under arms).(Shaving face and neck okay.)  Do not bring valuables to the hospital.(Hospital is not responsible for lost valuables).  Contacts, dentures or removable bridgework, body piercing, hair pins may not be worn into surgery.  Leave suitcase in the car. After surgery it may be brought to your room.  For patients admitted to the hospital, checkout time is 11:00 AM the day of discharge.(Restricted visitors-Any Persons displaying flu-like symptoms or illness).    Patients discharged the day of surgery will not be allowed to drive home. Must have responsible person with you x 24 hours once discharged.  Name and phone number of your driver: Cianna Kasparian, spouse [phone] cell/ home [phone] h   Special Instructions: CHG(Chlorhedine  4%-"Hibiclens","Betasept","Aplicare") Shower Use Special Wash: see special instructions.(avoid face and genitals)        _________________________    [facility] - Preparing for Surgery Before surgery, you can play an important role.  Because skin is not sterile, your skin needs to be as free of germs as possible.  You can reduce the number of germs on your skin by washing with CHG (chlorahexidine gluconate) soap before surgery.  CHG is an antiseptic cleaner which kills germs and bonds with the skin to continue killing germs even after washing. Please DO NOT use if you have an allergy to CHG or antibacterial soaps.  If your skin becomes reddened/irritated stop using the CHG and inform your nurse when you arrive at Short Stay. Do not shave (including legs and underarms) for at least 48 hours prior to the first CHG shower.  You may shave your face/neck. Please follow these instructions carefully:  1.  Shower with CHG Soap the night before surgery and the  morning of Surgery.  2.  If you choose to wash your hair, wash your hair first as usual with your  normal  shampoo.  3.  After you shampoo, rinse your hair and body thoroughly to remove the  shampoo.                           4.  Use CHG as you would any other liquid soap.  You can apply chg directly  to the skin and wash  Gently with a scrungie or clean washcloth.  5.  Apply the CHG Soap to your body ONLY FROM THE NECK DOWN.   Do not use on face/ open                           Wound or open sores. Avoid contact with eyes, ears mouth and genitals (private parts).                       Wash face,  Genitals (private parts) with your normal soap.             6.  Wash thoroughly, paying special attention to the area where your surgery  will be performed.  7.  Thoroughly rinse your body with warm water from the neck down.  8.  DO NOT shower/wash with your normal soap after using and rinsing off  the CHG Soap.                9.   Pat yourself dry with a clean towel.            10.  Wear clean pajamas.            11.  Place clean sheets on your bed the night of your first shower and do not  sleep with pets. Day of Surgery : Do not apply any lotions/deodorants the morning of surgery.  Please wear clean clothes to the hospital/surgery center.  FAILURE TO FOLLOW THESE INSTRUCTIONS MAY RESULT IN THE CANCELLATION OF YOUR SURGERY PATIENT SIGNATURE_________________________________  NURSE SIGNATURE__________________________________  ________________________________________________________________________

## 2014-05-08 NOTE — Pre-Procedure Instructions (Addendum)
05-08-14  EKG 02-23-14 report with chart, Cardiac Cath report with chart 02-21-14. CXR report with chart 4'15. 05-08-14 States scheduled for Stress test 05-09-14, I am  requesting report to be faxed [phone] once done and completed report. 05-10-14 0930 Stress test result of 05-09-14 with chart.

## 2014-05-16 MED ORDER — GENTAMICIN SULFATE 40 MG/ML IJ SOLN
280.0000 mg | INTRAVENOUS | Status: AC
  Administered 2014-05-17: 280 mg via INTRAVENOUS
  Filled 2014-05-16: qty 7

## 2014-05-17 ENCOUNTER — Encounter (HOSPITAL_COMMUNITY): Payer: Self-pay | Admitting: *Deleted

## 2014-05-17 ENCOUNTER — Ambulatory Visit (HOSPITAL_COMMUNITY)
Admission: RE | Admit: 2014-05-17 | Discharge: 2014-05-17 | Disposition: A | Discharge status: Home / Self Care | Payer: Medicare Other | Source: Ambulatory Visit | Attending: Urology | Admitting: Urology

## 2014-05-17 ENCOUNTER — Encounter (HOSPITAL_COMMUNITY): Payer: Medicare Other | Admitting: Anesthesiology

## 2014-05-17 ENCOUNTER — Ambulatory Visit (HOSPITAL_COMMUNITY): Payer: Medicare Other | Admitting: Anesthesiology

## 2014-05-17 ENCOUNTER — Encounter (HOSPITAL_COMMUNITY)
Admission: RE | Disposition: A | Discharge status: Home / Self Care | Payer: Self-pay | Source: Ambulatory Visit | Attending: Urology

## 2014-05-17 DIAGNOSIS — I10 Essential (primary) hypertension: Secondary | ICD-10-CM | POA: Diagnosis not present

## 2014-05-17 DIAGNOSIS — Z85038 Personal history of other malignant neoplasm of large intestine: Secondary | ICD-10-CM | POA: Insufficient documentation

## 2014-05-17 DIAGNOSIS — I2510 Atherosclerotic heart disease of native coronary artery without angina pectoris: Secondary | ICD-10-CM | POA: Diagnosis not present

## 2014-05-17 DIAGNOSIS — K219 Gastro-esophageal reflux disease without esophagitis: Secondary | ICD-10-CM | POA: Insufficient documentation

## 2014-05-17 DIAGNOSIS — Z79899 Other long term (current) drug therapy: Secondary | ICD-10-CM | POA: Diagnosis not present

## 2014-05-17 DIAGNOSIS — K449 Diaphragmatic hernia without obstruction or gangrene: Secondary | ICD-10-CM | POA: Insufficient documentation

## 2014-05-17 DIAGNOSIS — Z9010 Acquired absence of unspecified breast and nipple: Secondary | ICD-10-CM | POA: Diagnosis not present

## 2014-05-17 DIAGNOSIS — N200 Calculus of kidney: Secondary | ICD-10-CM | POA: Diagnosis not present

## 2014-05-17 DIAGNOSIS — Z9861 Coronary angioplasty status: Secondary | ICD-10-CM | POA: Insufficient documentation

## 2014-05-17 DIAGNOSIS — I252 Old myocardial infarction: Secondary | ICD-10-CM | POA: Insufficient documentation

## 2014-05-17 DIAGNOSIS — N3941 Urge incontinence: Secondary | ICD-10-CM | POA: Insufficient documentation

## 2014-05-17 DIAGNOSIS — N201 Calculus of ureter: Secondary | ICD-10-CM | POA: Insufficient documentation

## 2014-05-17 HISTORY — PX: CYSTOSCOPY WITH RETROGRADE PYELOGRAM, URETEROSCOPY AND STENT PLACEMENT: SHX5789

## 2014-05-17 SURGERY — CYSTOURETEROSCOPY, WITH RETROGRADE PYELOGRAM AND STENT INSERTION
Anesthesia: General | Laterality: Bilateral

## 2014-05-17 MED ORDER — OXYCODONE-ACETAMINOPHEN 2.5-325 MG PO TABS: 1.0000 | ORAL_TABLET | Freq: Four times a day (QID) | ORAL | Status: DC | PRN

## 2014-05-17 MED ORDER — SODIUM CHLORIDE 0.9 % IR SOLN
Status: DC | PRN
  Administered 2014-05-17: 5000 mL

## 2014-05-17 MED ORDER — PROPOFOL 10 MG/ML IV BOLUS
INTRAVENOUS | Status: DC | PRN
  Administered 2014-05-17: 110 mg via INTRAVENOUS

## 2014-05-17 MED ORDER — IOHEXOL 300 MG/ML  SOLN
INTRAMUSCULAR | Status: DC | PRN
  Administered 2014-05-17: 10 mL

## 2014-05-17 MED ORDER — SULFAMETHOXAZOLE-TRIMETHOPRIM 400-80 MG PO TABS: 1.0000 | ORAL_TABLET | Freq: Two times a day (BID) | ORAL | Status: DC

## 2014-05-17 MED ORDER — PROMETHAZINE HCL 25 MG/ML IJ SOLN
6.2500 mg | INTRAMUSCULAR | Status: DC | PRN
Start: 2014-05-17 — End: 2014-05-17

## 2014-05-17 MED ORDER — LACTATED RINGERS IV SOLN
INTRAVENOUS | Status: DC
  Administered 2014-05-17: 1000 mL via INTRAVENOUS

## 2014-05-17 MED ORDER — ONDANSETRON HCL 4 MG/2ML IJ SOLN
INTRAMUSCULAR | Status: AC
  Filled 2014-05-17: qty 2

## 2014-05-17 MED ORDER — FENTANYL CITRATE 0.05 MG/ML IJ SOLN
INTRAMUSCULAR | Status: DC | PRN
  Administered 2014-05-17: 50 ug via INTRAVENOUS
  Administered 2014-05-17 (×2): 25 ug via INTRAVENOUS
  Administered 2014-05-17: 50 ug via INTRAVENOUS
  Administered 2014-05-17: 25 ug via INTRAVENOUS

## 2014-05-17 MED ORDER — EPHEDRINE SULFATE 50 MG/ML IJ SOLN
INTRAMUSCULAR | Status: AC
  Filled 2014-05-17: qty 1

## 2014-05-17 MED ORDER — LIDOCAINE HCL (CARDIAC) 20 MG/ML IV SOLN
INTRAVENOUS | Status: AC
  Filled 2014-05-17: qty 5

## 2014-05-17 MED ORDER — EPHEDRINE SULFATE 50 MG/ML IJ SOLN
INTRAMUSCULAR | Status: DC | PRN
  Administered 2014-05-17 (×3): 5 mg via INTRAVENOUS

## 2014-05-17 MED ORDER — PROPOFOL 10 MG/ML IV BOLUS
INTRAVENOUS | Status: AC
Start: 2014-05-17 — End: 2014-05-17
  Filled 2014-05-17: qty 20

## 2014-05-17 MED ORDER — ONDANSETRON HCL 4 MG/2ML IJ SOLN
INTRAMUSCULAR | Status: DC | PRN
  Administered 2014-05-17: 4 mg via INTRAVENOUS

## 2014-05-17 MED ORDER — HYDROMORPHONE HCL PF 1 MG/ML IJ SOLN: 0.2500 mg | INTRAMUSCULAR | Status: DC | PRN

## 2014-05-17 MED ORDER — LIDOCAINE HCL 1 % IJ SOLN
INTRAMUSCULAR | Status: DC | PRN
  Administered 2014-05-17: 50 mg via INTRADERMAL

## 2014-05-17 MED ORDER — 0.9 % SODIUM CHLORIDE (POUR BTL) OPTIME
TOPICAL | Status: DC | PRN
  Administered 2014-05-17: 1000 mL

## 2014-05-17 MED ORDER — FENTANYL CITRATE 0.05 MG/ML IJ SOLN
INTRAMUSCULAR | Status: AC
  Filled 2014-05-17: qty 5

## 2014-05-17 MED ORDER — DEXAMETHASONE SODIUM PHOSPHATE 4 MG/ML IJ SOLN
INTRAMUSCULAR | Status: DC | PRN
  Administered 2014-05-17: 4 mg via INTRAVENOUS

## 2014-05-17 MED ORDER — LACTATED RINGERS IV SOLN
INTRAVENOUS | Status: DC | PRN
  Administered 2014-05-17: 13:00:00 via INTRAVENOUS

## 2014-05-17 MED ORDER — SENNOSIDES-DOCUSATE SODIUM 8.6-50 MG PO TABS: 1.0000 | ORAL_TABLET | Freq: Two times a day (BID) | ORAL | Status: DC

## 2014-05-17 SURGICAL SUPPLY — 23 items
BAG URINE DRAINAGE (UROLOGICAL SUPPLIES) IMPLANT
BASKET LASER NITINOL 1.9FR (BASKET) IMPLANT
BASKET STNLS GEMINI 4WIRE 3FR (BASKET) IMPLANT
BASKET ZERO TIP NITINOL 2.4FR (BASKET) IMPLANT
CATH INTERMIT  6FR 70CM (CATHETERS) ×3 IMPLANT
CLOTH BEACON ORANGE TIMEOUT ST (SAFETY) ×3 IMPLANT
DRAPE CAMERA CLOSED 9X96 (DRAPES) ×3 IMPLANT
ELECT REM PT RETURN 9FT ADLT (ELECTROSURGICAL)
ELECTRODE REM PT RTRN 9FT ADLT (ELECTROSURGICAL) IMPLANT
EXTRACTOR STONE NITINOL NGAGE (UROLOGICAL SUPPLIES) ×3 IMPLANT
FIBER LASER FLEXIVA 200 (UROLOGICAL SUPPLIES) IMPLANT
FIBER LASER FLEXIVA 365 (UROLOGICAL SUPPLIES) IMPLANT
GLOVE BIOGEL M STRL SZ7.5 (GLOVE) ×3 IMPLANT
GOWN STRL REUS W/TWL LRG LVL3 (GOWN DISPOSABLE) ×3 IMPLANT
GUIDEWIRE ANG ZIPWIRE 038X150 (WIRE) ×6 IMPLANT
GUIDEWIRE STR DUAL SENSOR (WIRE) ×3 IMPLANT
IV NS IRRIG 3000ML ARTHROMATIC (IV SOLUTION) ×3 IMPLANT
PACK CYSTO (CUSTOM PROCEDURE TRAY) ×3 IMPLANT
SHEATH ACCESS URETERAL 24CM (SHEATH) ×3 IMPLANT
STENT POLARIS 5FRX24 (STENTS) ×6 IMPLANT
SYRINGE 10CC LL (SYRINGE) IMPLANT
SYRINGE IRR TOOMEY STRL 70CC (SYRINGE) IMPLANT
TUBE FEEDING 8FR 16IN STR KANG (MISCELLANEOUS) ×3 IMPLANT

## 2014-05-17 NOTE — Brief Op Note (Signed)
05/17/2014  2:15 PM  PATIENT:  Rachael Humphrey  80 y.o. female  PRE-OPERATIVE DIAGNOSIS:  RIGHT URETERAL AND BILATERAL RENAL STONES  POST-OPERATIVE DIAGNOSIS:  RIGHT URETERAL AND BILATERAL RENAL STONES  PROCEDURE:  Procedure(s): CYSTOSCOPY WITH RETROGRADE PYELOGRAM, URETEROSCOPY AND STENT PLACEMENT, BASKET STONE REMOVAL (Bilateral)  SURGEON:  Surgeon(s) and Role:    * Alexis Frock, MD - Primary  PHYSICIAN ASSISTANT:   ASSISTANTS: none   ANESTHESIA:   general  EBL:     BLOOD ADMINISTERED:none  DRAINS: none   LOCAL MEDICATIONS USED:  NONE  SPECIMEN:  Source of Specimen:  bilateral renal stone fragments  DISPOSITION OF SPECIMEN:  [facility] for compositional analysis  COUNTS:  YES  TOURNIQUET:  * No tourniquets in log *  DICTATION: .Other Dictation: Dictation Number [id number]  PLAN OF CARE: Discharge to home after PACU  PATIENT DISPOSITION:  PACU - hemodynamically stable.   Delay start of Pharmacological VTE agent (>24hrs) due to surgical blood loss or risk of bleeding: no

## 2014-05-17 NOTE — Transfer of Care (Signed)
Immediate Anesthesia Transfer of Care Note  Patient: Rachael Humphrey  Procedure(s) Performed: Procedure(s): CYSTOSCOPY WITH RETROGRADE PYELOGRAM, URETEROSCOPY AND STENT PLACEMENT, BASKET STONE REMOVAL (Bilateral)  Patient Location: PACU  Anesthesia Type:General  Level of Consciousness: awake, alert , oriented and patient cooperative  Airway & Oxygen Therapy: Patient Spontanous Breathing and Patient connected to face mask oxygen  Post-op Assessment: Report given to PACU RN, Post -op Vital signs reviewed and stable and Patient moving all extremities  Post vital signs: Reviewed and stable  Complications: No apparent anesthesia complications

## 2014-05-17 NOTE — Anesthesia Preprocedure Evaluation (Addendum)
Anesthesia Evaluation  Patient identified by MRN, date of birth, ID band Patient awake    Reviewed: Allergy & Precautions, H&P , NPO status , Patient's Chart, lab work & pertinent test results, reviewed documented beta blocker date and time   History of Anesthesia Complications (+) PONV and history of anesthetic complications  Airway Mallampati: II TM Distance: >3 FB Neck ROM: Full    Dental no notable dental hx.    Pulmonary neg pulmonary ROS,  breath sounds clear to auscultation  Pulmonary exam normal       Cardiovascular hypertension, Pt. on medications and Pt. on home beta blockers + Past MI Rhythm:Regular Rate:Normal     Neuro/Psych negative neurological ROS  negative psych ROS   GI/Hepatic Neg liver ROS, hiatal hernia, GERD-  ,  Endo/Other  negative endocrine ROS  Renal/GU negative Renal ROS     Musculoskeletal negative musculoskeletal ROS (+)   Abdominal   Peds  Hematology negative hematology ROS (+)   Anesthesia Other Findings   Reproductive/Obstetrics negative OB ROS                          Anesthesia Physical Anesthesia Plan  ASA: III  Anesthesia Plan: General   Post-op Pain Management:    Induction: Intravenous  Airway Management Planned: LMA  Additional Equipment:   Intra-op Plan:   Post-operative Plan: Extubation in OR  Informed Consent: I have reviewed the patients History and Physical, chart, labs and discussed the procedure including the risks, benefits and alternatives for the proposed anesthesia with the patient or authorized representative who has indicated his/her understanding and acceptance.   Dental advisory given  Plan Discussed with: CRNA  Anesthesia Plan Comments:         Anesthesia Quick Evaluation

## 2014-05-17 NOTE — Discharge Instructions (Signed)
1 - You may have urinary urgency (bladder spasms) and bloody urine on / off with stent in place. This is normal.  2 - Call MD or go to ER for fever >102, severe pain / nausea / vomiting not relieved by medications, or acute change in medical status  3 - Remove tethered stents (there are two) on Monday morning at home by pulling on strings, then blue/white plastic tubing, and discarding. Dr. Tresa Moore is in the office on Monday if any issues arise.

## 2014-05-17 NOTE — H&P (Signed)
Rachael Humphrey is an 80 y.o. female.    Chief Complaint: Pre-Op Bilateral Ureteroscopic Stone Manipulation  HPI:   1 - Nephrolithiasis -  Pre 2015 - SWL for stone around 2004 03/2014 - 1mm Rt UVJ stone x 2 with mod hydro and bilateral scattered intrarenal stones (<75mm) by ER CT in [place] on eval colickly right flank pain.  2 - Urge Urinary Incontinence - pt with urinary urgency with occsional leakagehas been given anticholinergics in past.   3 - Left Small Adrenal Adenoma - 20mm left adrenal nodule stable per report by CT 03/2014. No refractory HTN or electrolyte abnormalities.   PMH sig for colon cancer (partial colectomy), bilateral mastectomy, CAD/Stent Plavix (stent placed within 58mos).  Today Rachael Humphrey is seen to proceed with bilateral ureteroscopic stone manipulation. No interval fevers. She has cardiac clearance.   Past Medical History  Diagnosis Date  . Complication of anesthesia   . PONV (postoperative nausea and vomiting)     no recent problems  . Hypertension   . Myocardial infarction     2 months ago, stent placed [facility]  . History of kidney stones     multiple  . GERD (gastroesophageal reflux disease)   . Hemorrhoids     internal-not a problem now.  . H/O hiatal hernia   . Cancer     colon cancer  . History of shingles     04-18-14 3 weeks now -pain only left posterior back , no outbreaks    Past Surgical History  Procedure Laterality Date  . Coronary angioplasty with stent placement      02-23-14 coranary stent placed.  Marland Kitchen Appendectomy    . Colectomy      partial small and large instestine removed  . Cataract extraction, bilateral      No family history on file. Social History:  reports that she has never smoked. She has never used smokeless tobacco. She reports that she does not drink alcohol or use illicit drugs.  Allergies:  Allergies  Allergen Reactions  . Codeine Nausea And Vomiting  . Darvon [Propoxyphene]     Altered mental status   . Demerol  [Meperidine]     Altered mental status   . Tramadol     "makes me crazy"   . Voltaren [Diclofenac Sodium]     "Makes me crazy"  . Zinc     Hurting in lower stomach     No prescriptions prior to admission    No results found for this or any previous visit (from the past 44 hour(s)). No results found.  Review of Systems  Constitutional: Negative.  Negative for fever and chills.  HENT: Negative.   Eyes: Negative.   Respiratory: Negative.   Cardiovascular: Negative.   Gastrointestinal: Positive for nausea and vomiting.  Genitourinary: Positive for flank pain.  Musculoskeletal: Negative.   Skin: Negative.   Neurological: Negative.   Endo/Heme/Allergies: Negative.   Psychiatric/Behavioral: Negative.     There were no vitals taken for this visit. Physical Exam  Constitutional: She appears well-developed and well-nourished.  HENT:  Head: Normocephalic and atraumatic.  Eyes: Pupils are equal, round, and reactive to light.  Neck: Normal range of motion.  Cardiovascular: Normal rate.   Respiratory: Effort normal.  GI: Soft. Bowel sounds are normal.  Genitourinary:  Mild Rt CVAT  Musculoskeletal: Normal range of motion.  Skin: Skin is warm and dry.  Psychiatric: She has a normal mood and affect. Her behavior is normal. Judgment and thought content normal.  Assessment/Plan  1 - Nephrolithiasis - agree ureteroscopy most definitive to resolve her rt ureteral stones. Also discussed pros / cons of concomitant bilateral "clean out" for goal of stone free and she opts for bilateral ureteroscopic stone manipulation. Her kidney funciton is normal and her hydro and stones appear chronic.  We rediscussed ureteroscopic stone manipulation with basketing and laser-lithotripsy in detail.  We rediscussed risks including bleeding, infection, damage to kidney / ureter  bladder, rarely loss of kidney. We rediscussed anesthetic risks and rare but serious surgical complications including DVT,  PE, MI, and mortality. We specifically readdressed that in 5-10% of cases a staged approach is required with stenting followed by re-attempt ureteroscopy if anatomy unfavorable. The patient voiced understanding and wises to proceed.   I can do her stone surgery safely ON plavix if needed as she is <50mo from stent. I informed her of the risks of being off antiplatelt agents and did discuss possiblity of delaying back surgeyr until 6 mos post-stent, but she adamantly refuses. She has aparantly talked to her cardiologist about this.   2 - Urge Urinary Incontinence - improved some on vesicare, low ureteral stone likely also contributing. At her age would consider change to myrbetriq to avoid congnitive / physiologic anticholinergic effects should they become problematic.   3 - Left Small Adrenal Adenoma - small and stable. No h/o refractory HTN, no further work-up indicated.    ,  05/17/2014, 6:14 AM

## 2014-05-17 NOTE — Anesthesia Postprocedure Evaluation (Signed)
Anesthesia Post Note  Patient: Rachael Humphrey  Procedure(s) Performed: Procedure(s) (LRB): CYSTOSCOPY WITH RETROGRADE PYELOGRAM, URETEROSCOPY AND STENT PLACEMENT, BASKET STONE REMOVAL (Bilateral)  Anesthesia type: General  Patient location: PACU  Post pain: Pain level controlled  Post assessment: Post-op Vital signs reviewed  Last Vitals: BP 138/79  Pulse 92  Temp(Src) 36.3 C (Oral)  Resp 14  SpO2 98%  Post vital signs: Reviewed  Level of consciousness: sedated  Complications: No apparent anesthesia complications

## 2014-05-18 ENCOUNTER — Encounter (HOSPITAL_COMMUNITY): Payer: Self-pay | Admitting: Urology

## 2014-05-18 NOTE — Op Note (Signed)
NAMEALLISYN, KUNZ NO.:  [id number]  MEDICAL RECORD NO.:  [id number]  LOCATION:  [facility]                         FACILITY:  [facility]  PHYSICIAN:  Alexis Frock, MD     DATE OF BIRTH:  1929/10/10  DATE OF PROCEDURE: 05/17/2014 DATE OF DISCHARGE:  05/17/2014                              OPERATIVE REPORT   DIAGNOSIS:  Bilateral renal, right ureteral stones.  PROCEDURES: 1. Cystoscopy with bilateral retrograde pyelogram with interpretation. 2. Bilateral ureteroscopy basketing of stones. 3. Insertion of bilateral ureteral stents, 5 x 22, Polaris with tether     to the thigh.  FINDINGS: 1. Unremarkable bilateral retrograde pyelograms. 2. Unremarkable urinary bladder. 3. Bilateral renal stones in all quadrants of the kidneys. 4. Suspect likely interval passage of right ureteral stone. 5. Unremarkable urinary bladder.  SPECIMEN:  Bilateral renal stone fragment for compositional analysis.  INDICATIONS:  Ms. Haefner is a pleasant 80 year old lady with recent history of ischemic heart disease, status post cardiac stent approximately 6 months ago.  Concomitantly, she was found on workup of right flank pain to have two right ureteral stones as well as bilateral renal stones.  Options were discussed with the patient including coming off anticoagulation for shockwave lithotripsy versus continued medical therapy only versus bilateral ureteroscopic stone manipulation with and without anticoagulation.  We both agreed that the safest way to proceed would be bilateral ureteroscopic stone manipulation without discontinuation of her anticoagulation.  Her cardiologist was also consulted in this matter and agreed.  Informed consent was obtained and placed in the medical record.  PROCEDURE IN DETAIL:  The patient being Moon Budde, was verified. Procedure being bilateral ureteroscopic stone manipulation was confirmed.  Procedure was carried out.  Time-out was  performed. Intravenous antibiotics were administered.  General LMA anesthesia was introduced.  The patient was placed into a low lithotomy position, and sterile field was created by prepping and draping the patient's vagina, introitus, and proximal thighs using iodine x3.  Next, cystourethroscopy was performed using a 22-French rigid cystoscope with 12-degree offset lens.  Inspection of the urinary bladder revealed no diverticula, calcifications, or papular lesions.  Ureteral orifices were Singleton in appearance bilaterally.  Attention was next directed to the right side. The right ureteral orifice was cannulated with a 6-French end-hole catheter and right retrograde pyelogram was obtained.  Right retrograde pyelogram demonstrated a single right ureter with single-system right kidney.  No filling defects or narrowing noted.  A 0.038 Glidewire was advanced at the level of the upper pole and set aside as a safety wire.  Next, semi-rigid ureteroscopy was performed the entire length of the right ureter alongside a separate Sensor working wire.  There were no mucosal abnormalities, calcifications seen whatsoever within the right ureter.  The right proximal ureter was a somewhat larger caliber, and this did raise suspicion of probable recently passed ureteral stone.  As the goal of the procedure today was stone free, the semi-rigid ureteroscope was exchanged for the 24-cm 12/14 ureteral access sheath at the level of the proximal ureter using continuous fluoroscopic guidance.  An 8-French feeding tube was placed in urinary bladder for pressure release and flexible digital ureteroscopy was performed of  the right kidney, this revealed multifocal small volume renal stent, mostly papillary tip, that were amenable to simple basketing.  As such, an NGage-type basket was used to grasp these stones sequentially and removed in their entirety, set aside for compositional analysis.  Following these  maneuvers, all stones larger than 1/3-mm had been removed.  There was excellent hemostasis.  There was no evidence of perforation.  The stent was removed under continuous ureteroscopic vision.  No mucosal abnormalities were seen in the right ureter and the safety wire was set aside on the right side.  Attention was then directed to the left.  Then, using cystoscopic approach, the left ureteral orifice was cannulated with a 6-French end-hole catheter and left retrograde pyelogram was obtained.  Left retrograde pyelogram demonstrated a single left ureter with single- system left kidney.  No filling defects or narrowing noted.  A 0.038 Glidewire was advanced at the level of the upper pole and set aside as a safety wire.  Next, semi-rigid ureteroscopy was performed at the entire length of left ureter alongside a separate Sensor working wire.  No mucosal abnormalities or calcifications were seen.  The semi-rigid ureteroscope was then exchanged for the 12/14 24-cm ureteral access sheath at the level of proximal ureter using fluoroscopic guidance. Next, flexible digital ureteroscopy was performed using 8-French digital ureteroscope, which allowed panendoscopic examination of the left kidney.  Multifocal stones were seen similarly to the right side.  These also appeared to be amenable to simple basketing.  As such, an NGage basket was used to grasp these fragments and removed sequentially in their entirety.  Following these maneuvers, all stones larger than 1/3- mm had been removed.  There was excellent hemostasis.  There was no evidence of perforation.  The access sheath was removed under continuous uteroscopic vision and no mucosal abnormalities were found.  Finally, a new 5 x 22 Polaris-type stent was placed over the bilateral remaining safety wires using fluoroscopic guidance.  Good proximal curl in the renal pelvis bilaterally and distal deployment in the urinary bladder. Tether was left in  place to these and fashioned to the inner thigh. Procedure was then terminated.  The patient tolerated the procedure well.  There were no immediate periprocedural complications.  The patient was taken to the postanesthesia care unit in stable condition.          ______________________________ Alexis Frock, MD     TM/MEDQ  D:  05/17/2014  T:  05/18/2014  Job:  [id number]

## 2014-07-12 ENCOUNTER — Ambulatory Visit: Payer: Self-pay | Admitting: Internal Medicine

## 2014-07-25 ENCOUNTER — Ambulatory Visit: Payer: Self-pay | Admitting: Internal Medicine

## 2015-01-06 NOTE — Discharge Summary (Signed)
PATIENT NAME:  Rachael Humphrey, SCEARCE MR#:  [id number] DATE OF BIRTH:  02/29/1930  DATE OF ADMISSION:  02/04/2014 DATE OF DISCHARGE:  02/08/2014  Type Of Discharge: The patient is transferred to a skilled nursing facility.   REASON FOR ADMISSION: Intractable back pain.   HISTORY OF PRESENT ILLNESS: The patient is an 81 year old female with a history of known degenerative disk disease and osteoporosis with previous compression fractures, who presented to the Emergency Room with worsening pain and difficulty ambulating. In the Emergency Room, the patient was given 3 doses of IV morphine with continued pain and was admitted for further evaluation.   PAST MEDICAL HISTORY:  1.  Osteoporosis.  2.  Degenerative disk disease with compression fractures.  3.  Chronic back pain.  4.  History of colon cancer. 5.  Benign hypertension.  6.  Hyperlipidemia.  7.  Osteoarthritis.  8.  Status post kyphoplasty.  9.  Status post appendectomy.   MEDICATIONS ON ADMISSION: Please see admission note.   ALLERGIES: DEMEROL, VOLTAREN AND CODEINE.   SOCIAL HISTORY: Negative for alcohol or tobacco abuse.   FAMILY HISTORY: Positive for coronary artery disease, colon cancer and hypertension.   REVIEW OF SYSTEMS: As per admission note.   PHYSICAL EXAM:  GENERAL: The patient was in no acute distress.  VITAL SIGNS: Stable and she was afebrile.  HEENT: Unremarkable.  NECK: Supple without JVD.  LUNGS: Clear.  CARDIAC: Regular rate and rhythm. Normal S1, S2. No significant murmurs.  ABDOMEN: Soft, nontender, with normoactive bowel sounds. No organomegaly or masses were appreciated.  EXTREMITIES: Without edema.  NEUROLOGIC: Grossly nonfocal.   HOSPITAL COURSE: The patient was admitted with intractable pain due to her back and compression fractures with known degenerative disk disease. She was seen in consultation by orthopedics and physical therapy. MRI of the back revealed an acute T7 compression fracture. She was seen  by the pain clinic for possible kyphoplasty, but this was deferred due to her outstanding cardia work-up for presumed heart disease. She was managed medically with pain medication, physical therapy, and heat. She was ambulating some. Physical therapy recommended skilled nursing placement for rehab. The patient denied chest pain throughout her hospitalization. Her narcotic-induced constipation improved with therapy in the hospital. She is now transferred to the skilled nursing facility for further care and rehabilitation.   DISCHARGE DIAGNOSES:  1.  Acute T7 compression fracture.  2.  Intractable back pain.  3.  Degenerative disk disease.  4.  Constipation.  5.  Hyponatremia.  6.  Osteoporosis.  7.  Benign hypertension. 8.  History of colon cancer.  9.  Hyperlipidemia.   DISCHARGE MEDICATIONS:  1.  Percocet 10/325, 1 p.o. q.4 hours p.r.n. pain.  2.  Lipitor 20 mg p.o. at bedtime.  3.  Tessalon 200 mg p.o. t.i.d.  4.  Zyrtec 10 mg p.o. at bedtime. 5.  Plavix 75 mg p.o. daily. 6.  Flexeril 10 mg p.o. q.8 hours p.r.n. muscle spasm. 7.  Colace 200 mg p.o. b.i.d. 8.  Duragesic 50 mcg topically q. 3 days. 9.  Flonase 2 puffs in each nostril daily. 10. Imdur 30 mg p.o. daily. 11. Ativan 1 mg p.o. q.4 hours p.r.n. anxiety. 12. Lidoderm patch q.12 hours off at bedtime. 13. Losartan 50 mg p.o. daily. 14. Milk of magnesia 30 mL p.o. q.12 hours p.r.n. constipation. 15. Procardia XL 30 mg p.o. daily. 16. Protonix 40 mg p.o. b.i.d. 17. MiraLax 17 grams p.o. b.i.d. 18. Zofran 4 mg p.o. q.4 hours p.r.n. nausea and  vomiting. 19. Prednisone 60 mg p.o. daily, tapering by 5 mg every day until off.  FOLLOWUP PLANS AND APPOINTMENTS: The patient will be followed by the skilled nursing facility resident physician. She is on a 2 gram sodium diet. She will be seen in consultation by physical therapy. She will have a followup appointment with me in the [facility] in 1 weeks' time, sooner if  needed.  ____________________________ Leonie Douglas Doy Hutching, MD jds:aw D: 02/08/2014 07:36:26 ET T: 02/08/2014 07:51:26 ET JOB#: [id number]  cc: Leonie Douglas. Doy Hutching, MD, <Dictator> JEFFREY Lennice Sites MD ELECTRONICALLY SIGNED 02/08/2014 17:52

## 2015-01-06 NOTE — Consult Note (Signed)
PATIENT NAME:  Rachael Humphrey, Rachael Humphrey MR#:  [id number] DATE OF BIRTH:  December 24, 1929  DATE OF CONSULTATION:  01/25/2014  REFERRING PHYSICIAN:   CONSULTING PHYSICIAN:  Isaias Cowman, MD  PRIMARY CARE PHYSICIAN: Dr. Doy Hutching.   CHIEF COMPLAINT: Chest pain.   REASON FOR CONSULTATION: Consultation requested for evaluation of chest pain.   HISTORY OF PRESENT ILLNESS: The patient is an 81 year old female with history of peripheral vascular disease, colon cancer and history of compression fractures. The patient reports that on 01/23/2014, she was working in the yard and later that evening experienced right-sided chest discomfort, which radiated to right shoulder and back. The patient awoke the next day, resumed yard work and then re-experienced discomfort primarily in her mid scapular region. The patient drove herself to [facility] Emergency Room where EKG was nondiagnostic. The patient's initial troponin was negative. The patient was admitted to telemetry where follow-up troponins were negative. The patient reports midsternal discomfort, which radiates to her back, which appears to be positional in nature. There is mild shortness of breath.   PAST MEDICAL HISTORY:  1.  Peripheral vascular disease.  2.  History of compression fracture.  3.  Colon cancer.   SOCIAL HISTORY: The patient is married and resides with her husband, who recently had a stroke.   FAMILY HISTORY: Positive for coronary artery disease.   REVIEW OF SYSTEMS: CONSTITUTIONAL: No fever or chills. EYES: No blurry vision.  EARS: No hearing loss. RESPIRATORY: No shortness of breath. CARDIOVASCULAR: Chest pain as described above. GASTROINTESTINAL: The patient has some mild nausea this morning. GENITOURINARY: No dysuria or hematuria. ENDOCRINE: No polyuria or polydipsia. MUSCULOSKELETAL: No arthralgias or myalgias. INTEGUMENTARY: No rash.  NEUROLOGICAL: No focal muscle weakness or numbness. PSYCHOLOGICAL: No depression or anxiety.   PHYSICAL  EXAMINATION:  VITAL SIGNS: Blood pressure 103/62, pulse 72, respirations 18, temperature 98.2, pulse oximetry 93%.  HEENT: Pupils equal and reactive to light and accommodation.  NECK: Supple without thyromegaly.  LUNGS: Clear.  HEART: Normal JVP. Normal PMI. Regular rate and rhythm. Normal S1, S2. No appreciable gallop, murmur, or rub.  ABDOMEN: Soft and nontender. Pulses were intact bilaterally.  MUSCULOSKELETAL: Normal muscle tone.  NEUROLOGIC: The patient is alert and oriented x 3. Motor and sensory both grossly intact.   IMPRESSION: An 81 year old female, who presents with chest pain with atypical features, nondiagnostic EKG and negative troponin.   RECOMMENDATIONS:  1.  I agree with overall current therapy.  2.  Defer full dose anticoagulation. 3.  I agree with ETT sestamibi study today.  ____________________________ Isaias Cowman, MD ap:aw D: 01/25/2014 07:26:11 ET T: 01/25/2014 07:46:31 ET JOB#: [id number]  cc: Isaias Cowman, MD, <Dictator> Isaias Cowman MD ELECTRONICALLY SIGNED 02/13/2014 15:03

## 2015-01-06 NOTE — Consult Note (Signed)
REASON FOR ADMISSION: Back pain OF PRESENT ILLNESS: Rachael Humphrey is an 81 year old woman with previous compression fractures with chronic back pain.Admitted last night for intractable back pain.  She states that the pain is located midback worse with stanind and forward flexion better with rest and pain medications. She states that she has not had any falls and occisionally uses a back brace to help with the pain.  She states that she is unable to function at home. Her husband cannot take care of her. Pain rated as moderate to severe. no bowel or bladder issues no neurologic issues at this time. no saddle anesthesia MEDICAL HISTORY: Osteoporosis.  Degenerative disk disease with compression fractures.  Chronic back pain.  History of colon cancer. Osteoarthritis.  Benign hypertension.  Status post kyphoplasty.  Status post appendectomy.  ON ADMISSION:  Vitamin D2, 50,000 units p.o. q. week.  Pantoprazole 40 mg p.o. daily.  Zofran 4 mg p.o. q.4 hours p.r.n. nausea, vomiting.  Procardia XL 30 mg p.o. daily.  Losartan 50 mg p.o. daily.  Imdur 30 mg p.o. daily.  Flonase 2 puffs in each nostril daily.  Flexeril 10 mg every 8 hours as needed.  Plavix 75 mg p.o. daily.  Zyrtec 10 mg p.o. daily.  Lipitor 20 mg p.o. at bedtime.  Norco 10/325, 1 p.o. q.4 hours p.r.n.   ALLERGIES: DEMEROL, VOLTAREN, AND CODEINE.  HISTORY: The patient is married. No history of alcohol or tobacco abuse.  HISTORY: Positive for hypertension, coronary artery disease, and colon cancer.  OF SYSTEMS: No fever or change in weight. No blurred or double vision. No glaucoma. NOSE, THROAT: No tinnitus or hearing loss. No nasal discharge or bleeding. No difficulty swallowing. The patient has chronic cough but no wheezing or hemoptysis. No painful respiration. No chest pain or orthopnea. No palpitations. Nausea but no vomiting or diarrhea. No dysuria or hematuria. No incontinence. No polyuria or polydipsia. No heat or cold intolerance. The patient denies  anemia, easy bruising, or bleeding. No swollen glands. The patient denies pain in her neck, shoulders, knees, or hips. No gout. No numbness or migraines. Denies stroke or seizures. The patient denies anxiety, insomnia, or depression.  EXAMINATION:A and Ox3 NADSIGNS: AFVSSNormocephalic, atraumatic. Pupils equal, round, reactive to light and accommodation. Extraocular movements are intact. Sclerae are anicteric. Conjunctivae are clear. Oropharynx is clear. no cervical neck pain with palpationnormal inspirationRegular rate and rythym5/5 C5-T1 with SILT C5-T1; no pain on palation of all upper extremity joints.  5/5 L2-S1 motor and SILT L2-S1 with no pain on palpation of all lower extremity joints. Pt has +2 radial pulse and +2 PT pulseno lesionsShe was cooperative and used good judgment.  AND RADIOLOGICAL DATA:  abdomen:previous T12 compression fx with kyphoplastyacute vs subacute compression fraction T9   T9 acute vs subacute compression fx  Degenerative disk disease.  Constipation.  Chronic compression fractures.  Hyponatremia.  Osteoporosis.  History of colon cancer.   recommend of MRI of thoracic spine upright XR in bracerecommend TLSO brace which Pt has at homePT/OTCan consider Kyphoplasty which would happen no sooner than TuesdayCan dischange and follow up in 1 week if ableCall with questions  Electronic Signatures for Addendum Section: Mary Sella (MD)  (Signed Addendum [phone] 17:40) MRI still not completed at this time; Upright XR demonstrate a T7 old and T9 acute vs sub acute compression fracture with subsquent kyphosis. Will still need MRI of thoracic spine to complete investigation.  Can follow up with ORTHO who would be able to preform a kyphoplasty.  Electronic Signatures: Mary Sella (MD) (Signed on 24-May-15 08:44)  Authored   Last Updated: 24-May-15 17:40 by Mary Sella (MD)

## 2015-01-06 NOTE — Discharge Summary (Signed)
Dates of Admission and Diagnosis:  Date of Admission 23-Feb-2014   Date of Discharge 24-Feb-2014   Admitting Diagnosis coronary artery disease   Final Diagnosis coronary artery disease   Discharge Diagnosis 1 back pain   2 hypertension   3 hyperlipidemia    Chief Complaint/History of Present Illness Pt presented to our office for risk stratific ation prior to back kyphoplasty. Functional study revealed evidence of anterior ischemia. She presented for left cardiac cath which revealed 99% stenosis of proximal lad witih TIMI 2 flow. She underwent pci of lad with bms and was placed in observation post pci.   Allergies:  Demerol: Alt Ment Status  Voltaren: N/V, Other  Codeine: GI Distress  Pertinent Past History:  Pertinent Past History spinal stenosis and compression fx of t7   Hospital Course:  Hospital Course Has done well post pci. No cath site abnormality. Hemodynaically stable. No chest pain. No evidence of ishcmeia.   Condition on Discharge Good   DISCHARGE INSTRUCTIONS HOME MEDS:  Medication Reconciliation: Patient's Home Medications at Discharge:     Medication Instructions  caltrate 600 + d  1 tab(s) orally once a day   centrum silver therapeutic multiple vitamins with minerals oral tablet  1 tab(s) orally once a day (at bedtime)   fluticasone nasal 50 mcg/inh nasal spray  2 spray(s) in each nostril once a day (at bedtime)   fentanyl  50 microgram(s) topically every 3 days   losartan 50 mg oral tablet  1 tab(s) orally once a day   atorvastatin 20 mg oral tablet  1 tab(s) orally once a day (at bedtime)   clopidogrel 75 mg oral tablet  1 tab(s) orally once a day   nifedipine 30 mg oral tablet, extended release  1 tab(s) orally once a day   lidoderm 5% topical film  1 patch topically once a day   pantoprazole 40 mg oral delayed release tablet  1 tab(s) orally 2 times a day   lisinopril 10 mg oral tablet  1 tab(s) orally once a day   actonel 35 mg oral tablet  1  tab(s) orally once a week   aspirin 325 mg oral delayed release tablet  1 tab(s) orally once a day    STOP TAKING THE FOLLOWING MEDICATION(S):    cyclobenzaprine 10 mg oral tablet: 1 tab(s) orally every 8 hours, As needed, muscle spasm benicar 20 mg oral tablet: 1 tab(s) orally once a day  Physician's Instructions:  Home Health? No   Dressing Care Staples are in place to close your incision.  Keep this area clean and dry.  Keep the bandage in place unless it becomes loose or soiled.   Home Oxygen? No   Diet Low Fat, Low Cholesterol   Activity Limitations No exertional activity  No heavy lifting   Return to Work Not Applicable   Time frame for Follow Up Appointment 1-2 weeks     Bartholome Bill A(Admitting Physician): [facility] [address], [place], [place] [address], [place]   [place]ulton Reek D(Family Physician): [facility], [address], [place], [place] [address], [place] [phone]  Electronic Signatures: Teodoro Spray (MD)  (Signed 12-Jun-15 07:53)  Authored: ADMISSION DATE AND DIAGNOSIS, CHIEF COMPLAINT/HPI, Allergies, PERTINENT PAST HISTORY, HOSPITAL COURSE, DISCHARGE INSTRUCTIONS HOME MEDS, PATIENT INSTRUCTIONS, Follow Up Physician   Last Updated: 12-Jun-15 07:53 by Teodoro Spray (MD)

## 2015-01-06 NOTE — H&P (Signed)
PATIENT NAME:  Rachael Humphrey, Rachael Humphrey MR#:  [id number] DATE OF BIRTH:  [birth year]  DATE OF ADMISSION:  02/04/2014  REFERRING PHYSICIAN: Dr. Jacqualine Code.  FAMILY PHYSICIAN: Dr. Doy Hutching.   REASON FOR ADMISSION: Intractable back pain.   HISTORY OF PRESENT ILLNESS: The patient is an 81 year old female with a history of known degenerative disk disease and previous compression fractures with chronic back pain. Presented to the Emergency Room with severe back pain and constipation. Has been using Norco 10/325 every 4 hours with no improvement of her pain. In the Emergency Room, the patient was given 3 doses of IV morphine with continued pain. She is unable to function at home. Her husband cannot take care of her. She is now admitted for further evaluation.   PAST MEDICAL HISTORY: 1.  Osteoporosis.  2.  Degenerative disk disease with compression fractures.  3.  Chronic back pain.  4.  History of colon cancer. 5.  Osteoarthritis.  6.  Benign hypertension.  7.  Status post kyphoplasty.  8.  Status post appendectomy.   MEDICATIONS ON ADMISSION:  1.  Vitamin D2, 50,000 units p.o. q. week.  2.  Pantoprazole 40 mg p.o. daily.  3.  Zofran 4 mg p.o. q.4 hours p.r.n. nausea, vomiting.  4.  Procardia XL 30 mg p.o. daily.  5.  Losartan 50 mg p.o. daily.  6.  Imdur 30 mg p.o. daily.  7.  Flonase 2 puffs in each nostril daily.  8.  Flexeril 10 mg every 8 hours as needed.  9.  Plavix 75 mg p.o. daily.  10.  Zyrtec 10 mg p.o. daily.  11.  Lipitor 20 mg p.o. at bedtime.  12.  Norco 10/325, 1 p.o. q.4 hours p.r.n.   ALLERGIES: DEMEROL, VOLTAREN, AND CODEINE.   SOCIAL HISTORY: The patient is married. No history of alcohol or tobacco abuse.   FAMILY HISTORY: Positive for hypertension, coronary artery disease, and colon cancer.   REVIEW OF SYSTEMS:  CONSTITUTIONAL: No fever or change in weight.  EYES: No blurred or double vision. No glaucoma.  EARS, NOSE, THROAT: No tinnitus or hearing loss. No nasal discharge or  bleeding. No difficulty swallowing.  RESPIRATORY: The patient has chronic cough but no wheezing or hemoptysis. No painful respiration.  CARDIOVASCULAR: No chest pain or orthopnea. No palpitations.  GASTROINTESTINAL: Nausea but no vomiting or diarrhea.  GENITOURINARY: No dysuria or hematuria. No incontinence.  ENDOCRINE: No polyuria or polydipsia. No heat or cold intolerance.  HEMATOLOGIC: The patient denies anemia, easy bruising, or bleeding.  LYMPHATIC: No swollen glands.  MUSCULOSKELETAL: The patient denies pain in her neck, shoulders, knees, or hips. No gout.  NEUROLOGIC: No numbness or migraines. Denies stroke or seizures.  PSYCHOLOGICAL: The patient denies anxiety, insomnia, or depression.   PHYSICAL EXAMINATION: GENERAL: The patient is in no acute distress.  VITAL SIGNS: Remarkable for a blood pressure of 119/80, heart rate 100, respiratory rate 20. She is afebrile. Sats 97% on room air.  HEENT: Normocephalic, atraumatic. Pupils equal, round, reactive to light and accommodation. Extraocular movements are intact. Sclerae are anicteric. Conjunctivae are clear. Oropharynx is clear.  NECK: Supple without JVD. No adenopathy or thyromegaly is noted.  LUNGS: Clear to auscultation and percussion without wheezes or rales. No dullness. Respiratory effort is normal.  CARDIAC: Regular rate and rhythm with a normal S1, S2. No significant rubs, murmurs, or gallops. PMI was nondisplaced. Chest wall was nontender.  ABDOMEN: Soft, nontender, with normoactive bowel sounds. No organomegaly or masses were appreciated. No hernias or bruits  were noted.  EXTREMITIES: Without clubbing, cyanosis, edema. Pulses were 2+ bilaterally.  SKIN: Warm and dry without rash or lesions.  NEUROLOGIC: Cranial nerves II through XII grossly intact. Deep tendon reflexes were symmetric. Motor and sensory exams nonfocal.  PSYCHIATRIC: Revealed a patient who was alert and oriented to person, place, and time. She was cooperative and  used good judgment.   LABORATORY AND RADIOLOGICAL DATA: Troponin was less than 0.02. Glucose 101 with a BUN of 13, creatinine of 0.78, and a sodium of 135. GFR of greater than 60. White count was 9.3 with a hemoglobin of 16.0. Urinalysis was unremarkable. CT of the abdomen was essentially unremarkable, except for constipation and previous kyphoplasty.   ASSESSMENT: 1.  Intractable back pain.  2.  Degenerative disk disease.  3.  Constipation.  4.  Chronic compression fractures.  5.  Hyponatremia.  6.  Osteoporosis.  7.  History of colon cancer.   PLAN: The patient will be admitted to the floor with IV Dilaudid and p.r.n. Zofran. Will begin a Duragesic patch as well as a Lidoderm patch. Will use Relistor to see if this helps with her constipation. Will also begin MiraLax, Colace, and Dulcolax suppositories. Will consult physical therapy and orthopedics. Will also consult the social worker, as the patient will probably require rehab placement. Clear liquid diet for now. Continue her outpatient medications. Further treatment and evaluation will depend upon the patient's progress.   TOTAL TIME SPENT ON THIS PATIENT: 50 minutes.    ____________________________ Leonie Douglas Doy Hutching, MD jds:jcm D: 02/04/2014 16:04:49 ET T: 02/04/2014 17:50:07 ET JOB#: [id number]  cc: Leonie Douglas. Doy Hutching, MD, <Dictator>  Lennice Sites MD ELECTRONICALLY SIGNED 02/04/2014 18:58

## 2015-01-06 NOTE — H&P (Signed)
PATIENT NAME:  Rachael Humphrey, Rachael Humphrey MR#:  [id number] DATE OF BIRTH:  05/28/1930  DATE OF ADMISSION:  01/25/2014  REFERRING PHYSICIAN ASSISTANT:  Sam Bullard.   PRIMARY CARE PHYSICIAN:  Dr. Fulton Reek at [facility].   CHIEF COMPLAINT:  Chest pain.   HISTORY OF PRESENT ILLNESS:  An 81 year old Caucasian female with past medical history of colon cancer as well as osteoporosis, peripheral vascular disease, presenting with chest pain.  She describes acute onset of right-sided chest pain with radiation to the back, dull in quality, 6 to 7 out of 10 in intensity.  No worsening or relieving factors.  Complaining of associated dyspnea on exertion.  Dyspnea on exertional actually lasting for about three days in total.  She also denotes having a cough which has been nonproductive for approximately one month in total duration, though slowly improving.  She currently has no complaints; however EKG on initial work-up in the Emergency Department revealed trigeminy.  She denies any palpitations or further chest pain.   REVIEW OF SYSTEMS:  CONSTITUTIONAL:  Denies fevers, chills, fatigue, weakness.  EYES:  Denies blurred vision, double vision, eye pain.  EARS, NOSE, THROAT:  Denies tinnitus, ear pain, hearing loss.  RESPIRATORY:  Denies cough, wheeze, shortness of breath, other than described above with dyspnea on exertion.  CARDIOVASCULAR:  Positive for chest pain as described above.  Denies any palpitations or edema.  GASTROINTESTINAL:  Denies nausea, vomiting, diarrhea, abdominal pain.  GENITOURINARY:  Denies dysuria, hematuria.  ENDOCRINE:  Denies nocturia or thyroid problems.  HEMATOLOGIC AND LYMPHATIC:  Denies easy bruising, bleeding.  SKIN:  Denies rashes or lesions.  MUSCULOSKELETAL:  Denies pain in neck, back, shoulders, knees, hips or arthritic symptoms.  NEUROLOGIC:  Denies paralysis, paresthesias.  PSYCHIATRIC:  Denies anxiety or depressive symptoms.  Otherwise, full review of systems performed  by me is negative.   PAST MEDICAL HISTORY:  Colon cancer, osteoporosis, history of compression fracture as well as peripheral vascular disease.   SOCIAL HISTORY:  Denies any alcohol, tobacco or drug usage.  Fully independent for activities of daily living.   FAMILY HISTORY:  Positive for coronary artery disease.   ALLERGIES:  CODEINE, DEMEROL AND VOLTAREN.   HOME MEDICATIONS:  Include losartan 50 mg by mouth daily, sertraline 10 mg by mouth daily, atorvastatin 20 mg by mouth at bedtime, Plavix 75 mg by mouth daily, Vancenase 100 mg 2 capsules by mouth 3 times daily, Nifediac 30 mg by mouth daily, Flonase 0.05 mg nasal spray one spray to each nares daily, pantoprazole 40 mg by mouth daily, calcium 600 plus D 1 tablet twice daily, Centrum Silver multivitamin 1 tab by mouth at bedtime, vitamin D2 50,000 international units by mouth daily.   PHYSICAL EXAMINATION: VITAL SIGNS:  Temperature 98.3, heart rate 78, respirations 20, blood pressure 111/78, saturating 96% on room air.  Weight 61.2 kg, BMI 25.5.  GENERAL:  Well-nourished, well-developed, Caucasian female, currently in no acute distress.  HEAD:  Normocephalic, atraumatic.  EYES:  Pupils equal, round, reactive to light.  Extraocular muscles intact.  No scleral icterus.  MOUTH:  Moist mucous membranes.  Dentition intact.  No abscess noted.  EAR, NOSE, THROAT:  Throat clear without exudates.  No external lesions.  NECK:  Supple.  No thyromegaly.  No nodules.  No JVD.  PULMONARY:  Clear to auscultation bilaterally without wheezes, rales or rhonchi.  No use of accessory muscles.  Good respiratory effort.  CHEST:  Nontender to palpation.  CARDIOVASCULAR:  S1, S2, regular rate  and rhythm.  No murmurs, rubs, or gallops.  No edema.  Pedal pulse 2+ bilaterally.  GASTROINTESTINAL:  Soft, nontender, nondistended.  No masses.  Positive bowel sounds.  No hepatosplenomegaly.  MUSCULOSKELETAL:  No swelling, clubbing, or edema.  Range of motion full in all  extremities.  NEUROLOGIC:  Cranial nerves II through XII intact.  No gross focal neurological deficits.  Sensation intact.  Reflexes intact.  SKIN:  No ulcerations, lesions, rash, cyanosis.  Skin warm, dry.  Turgor intact.  PSYCHIATRIC:  Mood and affect within normal limits.  The patient awake, alert, oriented x 3.  Insight and judgment intact.   LABORATORY DATA:  EKG performed.  Sinus tachycardia, heart rate of 100 with PVCs in trigeminy pattern.  No ST or T wave abnormalities.  Chest x-ray was performed revealing no active cardiopulmonary process.  CT chest performed revealing no evidence of PE or bony abnormalities. Sodium 147, potassium 4, chloride 113, bicarb 25, BUN 13, creatinine 1.33, glucose 147, magnesium of 1.8, troponin less than 0.02.  WBC 7.6, hemoglobin 14.5, platelets of 138.   ASSESSMENT AND PLAN:  An 81 year old Caucasian female with history of colon cancer, osteoporosis, peripheral vascular disease, presenting with chest pain.  1.  Chest pain.  Place on telemetry under observational status.  She has been given aspirin.  Continue aspirin and statin therapy.  Trend cardiac enzymes x 3.  Possible musculoskeletal etiology versus atypical pain.  She is currently nontender to palpation making musculoskeletal somewhat less likely given her long history of cough, it does seem as possible source.  We will provide pain control as required.  2.  Acute kidney injury.  Gentle intravenous fluid hydration.  3.  Hypomagnesemia in an patient with PVCs and trigeminy.  We will replace magnesium to goal of 2.  4.  Peripheral vascular disease.  Continue Plavix and statin therapy.  5.  Venous thromboembolism prophylaxis with heparin subQ.  6.  CODE STATUS:  THE PATIENT IS FULL CODE.   TIME SPENT:  45 minutes.    ____________________________ Aaron Mose. Hower, MD dkh:ea D: 01/24/2014 23:49:05 ET T: 01/25/2014 00:21:52 ET JOB#: [id number]  cc: Aaron Mose. Hower, MD, <Dictator> DAVID Woodfin Ganja  MD ELECTRONICALLY SIGNED 02/01/2014 11:32

## 2015-01-06 NOTE — Consult Note (Signed)
PATIENT NAME:  Rachael Humphrey, CUEN MR#:  [id number] DATE OF BIRTH:  06-16-1930  DATE OF CONSULTATION:  02/07/2014  REFERRING PHYSICIAN:  Leonie Douglas. Doy Hutching, MD CONSULTING PHYSICIAN:   A. Dossie Arbour, MD  NOTE: This is the case of an 81 year old white female patient with a history significant for taking Plavix, which she stopped approximately 5 days ago. She was admitted to the hospital because of thoracic pain. X-rays and an MRI of the thoracic spine have confirmed that the patient has multiple compression fractures of the thoracic spine. However, only the T7 seems to be new. The patient has a prior history of having had vertebral compression fractures treated by Dr. Mauri Pole with kyphoplasty. The T7 compression fracture seems to have progressed when compared to a CT scan done on 01/24/2014. The T9 and T12 compression fractures are thought to be chronic. The T12 has evidence of a prior kyphoplasty. We will document the patient's acute fracture and consider doing a kyphoplasty. However, of concern is the patient's remark that she was apparently found to have some coronary artery disease, and she is pending to be evaluated by Dr. Saralyn Pilar. However, upon speaking to Dr. Doy Hutching,  it would seem that a cardiac cath has been put on hold due to the patient's inability to stay still. During today's evaluation in the clinic, she seems to be stable and doing rather well. She has not really moved significantly. She indicates that she is able to tolerate oxycodone, except for the fact that it does give her some constipation. This can been managed by giving the patient MiraLax on a daily basis and also Dulcolax at bedtime. We can follow up with the patient in the outpatient department and plan on doing the kyphoplasty as an outpatient as well. However, we would prefer to have the patient off the blood thinners for the surgical procedure and hopefully cleared by cardiology. For now, an alternative would be to put the patient  on steroids and some opioid medications until the above can be accomplished.   ____________________________ Kathlen Brunswick. Dossie Arbour, MD fan:jcm D: 02/07/2014 13:04:17 ET T: 02/07/2014 13:26:37 ET JOB#: [id number]  cc:  A. Dossie Arbour, MD, <Dictator> Gaspar Cola MD ELECTRONICALLY SIGNED 02/08/2014 20:12

## 2015-01-06 NOTE — Op Note (Signed)
PATIENT NAME:  Rachael Humphrey, Rachael Humphrey MR#:  [id number] DATE OF BIRTH:  11/19/1929  DATE OF PROCEDURE:  04/11/2014  PREOPERATIVE DIAGNOSIS: T7 compression fracture, severe.  POSTOPERATIVE DIAGNOSIS:  T7 compression fracture, severe.   PROCEDURE: T7 kyphoplasty.   ANESTHESIA: Monitored anesthesia care.   SURGEON: Hessie Knows, M.D.   DESCRIPTION OF PROCEDURE: The patient was brought to the operating room and after adequate sedation was obtained, the patient was placed prone. C-arm was brought in and adequate visualization of the T7 vertebral body was obtained with significant compression present. After patient identification and timeout procedures were completed, 10 mL of 1% Xylocaine were infiltrated in the subcutaneous tissue on the right side after prepping with alcohol. The back was then prepped and draped in the usual sterile manner and repeat timeout procedure completed.   A spinal needle was used to give local anesthetic down to lateral aspect of the pedicle over the rib, with this being done under fluoroscopic guidance with a mixture of 1% Xylocaine and Sensorcaine 0.5% with epinephrine. A small stab incision was made and a trocar used to advance down to the pedicle and advanced in the vertebral body, care being taken on both AP and lateral projections to make sure there was no penetration or violation of the spinal canal. A biopsy was attempted to be obtained, but because of the very narrow space no biopsy was obtained. Drilling was then carried out and a balloon inserted. It did have high pressure and it appeared that the fracture had partially healed with this high pressure. Cement was then injected through this right-sided stick with partial fill of the vertebral body. There is also some extravasation to the T7-T8 intervertebral disk. After adequate fill had been obtained and no posterior extravasation into the spinal canal, the trocar was removed with permanent C-arm views obtained.   Skin  closure with Dermabond was then carried out. A Band-Aid applied.   The patient was sent to the recovery room in stable condition.   ESTIMATED BLOOD LOSS: Minimal.   COMPLICATIONS: None.   SPECIMEN: None.   ____________________________ Laurene Footman, MD mjm:jh D: 04/11/2014 18:27:00 ET T: 04/11/2014 19:19:32 ET JOB#: [id number]  cc: Laurene Footman, MD, <Dictator> Laurene Footman MD ELECTRONICALLY SIGNED 04/12/2014 8:20

## 2015-02-27 ENCOUNTER — Other Ambulatory Visit: Payer: Self-pay | Admitting: Internal Medicine

## 2015-02-27 DIAGNOSIS — Z8673 Personal history of transient ischemic attack (TIA), and cerebral infarction without residual deficits: Secondary | ICD-10-CM

## 2015-02-27 DIAGNOSIS — R928 Other abnormal and inconclusive findings on diagnostic imaging of breast: Secondary | ICD-10-CM

## 2015-03-02 ENCOUNTER — Ambulatory Visit: Payer: Medicare Other

## 2015-03-09 ENCOUNTER — Ambulatory Visit: Payer: Self-pay

## 2015-03-09 ENCOUNTER — Ambulatory Visit
Admission: RE | Admit: 2015-03-09 | Discharge: 2015-03-09 | Disposition: A | Discharge status: Home / Self Care | Payer: Medicare Other | Source: Ambulatory Visit | Attending: Internal Medicine | Admitting: Internal Medicine

## 2015-03-09 ENCOUNTER — Other Ambulatory Visit: Payer: Self-pay | Admitting: Internal Medicine

## 2015-03-09 DIAGNOSIS — N63 Unspecified lump in breast: Secondary | ICD-10-CM | POA: Insufficient documentation

## 2015-03-09 DIAGNOSIS — R928 Other abnormal and inconclusive findings on diagnostic imaging of breast: Secondary | ICD-10-CM

## 2015-03-09 DIAGNOSIS — Z9882 Breast implant status: Secondary | ICD-10-CM | POA: Diagnosis not present

## 2015-03-09 DIAGNOSIS — N6001 Solitary cyst of right breast: Secondary | ICD-10-CM | POA: Diagnosis not present

## 2015-07-13 ENCOUNTER — Other Ambulatory Visit: Payer: Self-pay | Admitting: Internal Medicine

## 2015-07-13 DIAGNOSIS — Z1231 Encounter for screening mammogram for malignant neoplasm of breast: Secondary | ICD-10-CM

## 2015-07-20 ENCOUNTER — Ambulatory Visit
Admission: RE | Admit: 2015-07-20 | Discharge: 2015-07-20 | Disposition: A | Discharge status: Home / Self Care | Payer: Medicare Other | Source: Ambulatory Visit | Attending: Internal Medicine | Admitting: Internal Medicine

## 2015-07-20 ENCOUNTER — Other Ambulatory Visit: Payer: Self-pay | Admitting: Internal Medicine

## 2015-07-20 DIAGNOSIS — Z1231 Encounter for screening mammogram for malignant neoplasm of breast: Secondary | ICD-10-CM | POA: Insufficient documentation

## 2015-08-29 ENCOUNTER — Encounter: Payer: Self-pay | Admitting: *Deleted

## 2015-09-18 ENCOUNTER — Ambulatory Visit (INDEPENDENT_AMBULATORY_CARE_PROVIDER_SITE_OTHER): Payer: Medicare Other | Admitting: General Surgery

## 2015-09-18 ENCOUNTER — Encounter: Payer: Self-pay | Admitting: General Surgery

## 2015-09-18 VITALS — BP 148/90 | HR 60 | Resp 12 | Ht 61.0 in | Wt 133.0 lb

## 2015-09-18 DIAGNOSIS — T8543XA Leakage of breast prosthesis and implant, initial encounter: Secondary | ICD-10-CM | POA: Insufficient documentation

## 2015-09-18 DIAGNOSIS — T8549XA Other mechanical complication of breast prosthesis and implant, initial encounter: Secondary | ICD-10-CM | POA: Diagnosis not present

## 2015-09-18 NOTE — Progress Notes (Signed)
Patient ID: Rachael Humphrey, female   DOB: [birth year], 82 y.o.   MRN: [id number]  Chief Complaint  Patient presents with  . Other    breast evaluation    HPI Rachael Humphrey is a 82 y.o. female.  who presents for a breast evaluation. The most recent mammogram was done on 07-20-15.  Patient does perform regular self breast checks and gets regular mammograms done.  She states her breast has always been sensitive. She denies any breast issues, she states the mammogram really hurt. She states her breast look the same to her and "can't tell that the right breast is ruptured". Denies breast injury or trauma. She is found mammograms to be increasing more discomforting in recent years.   The patient underwent bilateral subcutaneous mastectomies in late 1970s with placement of silicone implants. She had multiple biopsies in both breasts over the years as well as significant breast discomfort. She initially been reluctant to have the surgery, but since then she's been very pleased and except for some mild breast discomfort especially with manipulation she's been entirely asymptomatic.  She is here today with her husband of 7 years.  I personally reviewed the patient's history.   HPI  Past Medical History  Diagnosis Date  . Complication of anesthesia   . PONV (postoperative nausea and vomiting)     no recent problems  . Hypertension   . Myocardial infarction ([place])     2 months ago, stent placed [facility]  . History of kidney stones     multiple  . GERD (gastroesophageal reflux disease)   . Hemorrhoids     internal-not a problem now.  . H/O hiatal hernia   . History of shingles     04-18-14 3 weeks now -pain only left posterior back , no outbreaks  . Cancer [facility]) 2005    colon cancer    Past Surgical History  Procedure Laterality Date  . Coronary angioplasty with stent placement      02-23-14 coranary stent placed.  Marland Kitchen Appendectomy    . Colectomy      partial small and large instestine removed   . Cataract extraction, bilateral    . Cystoscopy with retrograde pyelogram, ureteroscopy and stent placement Bilateral 05/17/2014    Procedure: CYSTOSCOPY WITH RETROGRADE PYELOGRAM, URETEROSCOPY AND STENT PLACEMENT, BASKET STONE REMOVAL;  Surgeon: Alexis Frock, MD;  Location: [facility];  Service: Urology;  Laterality: Bilateral;  . Breast biopsy Right     over 30 years ago negative  . Breast biopsy Left     negative over 30 years ago  . Augmentation mammaplasty Bilateral 1978    Family History  Problem Relation Age of Onset  . Breast cancer Sister 2  . Stroke Mother     Social History Social History  Substance Use Topics  . Smoking status: Never Smoker   . Smokeless tobacco: Never Used  . Alcohol Use: No    Allergies  Allergen Reactions  . Codeine Nausea And Vomiting  . Darvon [Propoxyphene]     Altered mental status   . Demerol [Meperidine]     Altered mental status   . Tramadol     "makes me crazy"   . Voltaren [Diclofenac Sodium]     "Makes me crazy"  . Zinc     Hurting in lower stomach     Current Outpatient Prescriptions  Medication Sig Dispense Refill  . aspirin 325 MG tablet Take 325 mg by mouth daily.    . Calcium  Carb-Cholecalciferol (CALCIUM 600 + D PO) Take 1 tablet by mouth daily.    . cetirizine (ZYRTEC) 10 MG tablet Take 10 mg by mouth daily.    . clopidogrel (PLAVIX) 75 MG tablet Take 75 mg by mouth every morning.    . docusate sodium (COLACE) 100 MG capsule Take 100 mg by mouth daily.    . fluticasone (FLONASE) 50 MCG/ACT nasal spray Place 1 spray into both nostrils at bedtime as needed for allergies or rhinitis.    Marland Kitchen lidocaine (LIDODERM) 5 % Place 1 patch onto the skin daily as needed (Pain). Remove & Discard patch within 12 hours or as directed by MD    . losartan (COZAAR) 50 MG tablet Take 50 mg by mouth every morning.    . metoprolol succinate (TOPROL-XL) 25 MG 24 hr tablet Take 25 mg by mouth every morning.    . Multiple Vitamin (MULTIVITAMIN  WITH MINERALS) TABS tablet Take 1 tablet by mouth daily.    . naphazoline-pheniramine (EYE ALLERGY RELIEF) 0.025-0.3 % ophthalmic solution Place 1 drop into both eyes 4 (four) times daily as needed for irritation.    Marland Kitchen NIFEdipine (PROCARDIA-XL/ADALAT-CC/NIFEDICAL-XL) 30 MG 24 hr tablet Take 30 mg by mouth every morning.    . pantoprazole (PROTONIX) 40 MG tablet Take 40 mg by mouth daily.    Marland Kitchen atorvastatin (LIPITOR) 20 MG tablet Take 20 mg by mouth daily. Reported on 09/18/2015     No current facility-administered medications for this visit.    Review of Systems Review of Systems  Constitutional: Negative.   Respiratory: Negative.   Cardiovascular: Negative.     Blood pressure 148/90, pulse 60, resp. rate 12, height 5\' 1"  (1.549 m), weight 133 lb (60.328 kg).  Physical Exam Physical Exam  Constitutional: She is oriented to person, place, and time. She appears well-developed and well-nourished.  HENT:  Mouth/Throat: Oropharynx is clear and moist.  Eyes: Conjunctivae are normal. No scleral icterus.  Neck: Neck supple.  Cardiovascular: Normal rate, regular rhythm and normal heart sounds.   Pulmonary/Chest: Effort normal and breath sounds normal. Right breast exhibits no inverted nipple, no mass, no nipple discharge, no skin change and no tenderness. Left breast exhibits no inverted nipple, no mass, no nipple discharge, no skin change and no tenderness.  bilateral implants present.  Lymphadenopathy:    She has no cervical adenopathy.    She has no axillary adenopathy.  Neurological: She is alert and oriented to person, place, and time.  Skin: Skin is warm and dry.  Psychiatric: Her behavior is normal.    Data Reviewed 2015 and 2016 mammogram studies were reviewed. New area of leak identified in the upper-outer quadrant of the right breast noted on her 07/20/2015 screening exam. Entirely fatty replaced residual breast parenchyma. BIRAD-1.   Assessment    Radiologic evidence of  silicon leak without clinical symptoms.    Plan    At this time, I see no absolute indication for implant removal. Possibility of silicon migrations the lymph nodes is present but will likely be asymptomatic. If she were to develop discomfort or appreciate a significant loss in volume implant removal with or without immediate reimplantation could be considered.  Pros and cons of annual mammographic screening in light of her age, past medical history and family history was discussed. Likely little benefit at this time but increased risk for ongoing or worsening rupture of the implant capsule on the right.  My vote would be against further mammographic imaging, but she will discuss  this with Dr. Doy Hutching at her yearly physical.    Discussed options. Follow up as needed. The patient is aware to call back for any questions or concerns.   PCP:  Fulton Reek D This information has been scribed by Karie Fetch [place].   Robert Bellow 09/18/2015, 9:56 PM

## 2015-09-18 NOTE — Patient Instructions (Signed)
The patient is aware to call back for any questions or concerns.  

## 2016-06-25 ENCOUNTER — Other Ambulatory Visit: Payer: Self-pay | Admitting: Internal Medicine

## 2016-06-25 DIAGNOSIS — Z1231 Encounter for screening mammogram for malignant neoplasm of breast: Secondary | ICD-10-CM

## 2016-07-17 ENCOUNTER — Ambulatory Visit
Admission: RE | Admit: 2016-07-17 | Discharge: 2016-07-17 | Disposition: A | Discharge status: Home / Self Care | Payer: Medicare Other | Source: Ambulatory Visit | Attending: Internal Medicine | Admitting: Internal Medicine

## 2016-07-17 DIAGNOSIS — Y838 Other surgical procedures as the cause of abnormal reaction of the patient, or of later complication, without mention of misadventure at the time of the procedure: Secondary | ICD-10-CM | POA: Diagnosis not present

## 2016-07-17 DIAGNOSIS — Z1231 Encounter for screening mammogram for malignant neoplasm of breast: Secondary | ICD-10-CM | POA: Diagnosis present

## 2016-07-17 DIAGNOSIS — T85898A Other specified complication of other internal prosthetic devices, implants and grafts, initial encounter: Secondary | ICD-10-CM | POA: Diagnosis not present

## 2016-07-22 ENCOUNTER — Other Ambulatory Visit: Payer: Self-pay | Admitting: Internal Medicine

## 2016-07-22 DIAGNOSIS — N6489 Other specified disorders of breast: Secondary | ICD-10-CM

## 2016-08-14 ENCOUNTER — Ambulatory Visit
Admission: RE | Admit: 2016-08-14 | Discharge: 2016-08-14 | Disposition: A | Discharge status: Home / Self Care | Payer: Medicare Other | Source: Ambulatory Visit | Attending: Internal Medicine | Admitting: Internal Medicine

## 2016-08-14 DIAGNOSIS — N6489 Other specified disorders of breast: Secondary | ICD-10-CM

## 2016-08-14 DIAGNOSIS — R928 Other abnormal and inconclusive findings on diagnostic imaging of breast: Secondary | ICD-10-CM | POA: Diagnosis not present

## 2016-08-18 ENCOUNTER — Other Ambulatory Visit: Payer: Self-pay | Admitting: Internal Medicine

## 2016-08-18 DIAGNOSIS — N63 Unspecified lump in unspecified breast: Secondary | ICD-10-CM

## 2016-10-06 ENCOUNTER — Ambulatory Visit (INDEPENDENT_AMBULATORY_CARE_PROVIDER_SITE_OTHER): Payer: Medicare Other | Admitting: Podiatry

## 2016-10-06 ENCOUNTER — Ambulatory Visit (INDEPENDENT_AMBULATORY_CARE_PROVIDER_SITE_OTHER): Payer: Medicare Other

## 2016-10-06 ENCOUNTER — Encounter: Payer: Self-pay | Admitting: Podiatry

## 2016-10-06 DIAGNOSIS — M129 Arthropathy, unspecified: Secondary | ICD-10-CM | POA: Diagnosis not present

## 2016-10-06 DIAGNOSIS — M792 Neuralgia and neuritis, unspecified: Secondary | ICD-10-CM | POA: Diagnosis not present

## 2016-10-06 DIAGNOSIS — Z8673 Personal history of transient ischemic attack (TIA), and cerebral infarction without residual deficits: Secondary | ICD-10-CM | POA: Insufficient documentation

## 2016-10-06 DIAGNOSIS — K449 Diaphragmatic hernia without obstruction or gangrene: Secondary | ICD-10-CM | POA: Insufficient documentation

## 2016-10-06 DIAGNOSIS — I10 Essential (primary) hypertension: Secondary | ICD-10-CM | POA: Insufficient documentation

## 2016-10-06 DIAGNOSIS — E119 Type 2 diabetes mellitus without complications: Secondary | ICD-10-CM | POA: Insufficient documentation

## 2016-10-06 DIAGNOSIS — E039 Hypothyroidism, unspecified: Secondary | ICD-10-CM | POA: Insufficient documentation

## 2016-10-06 DIAGNOSIS — N200 Calculus of kidney: Secondary | ICD-10-CM | POA: Insufficient documentation

## 2016-10-06 DIAGNOSIS — S42309A Unspecified fracture of shaft of humerus, unspecified arm, initial encounter for closed fracture: Secondary | ICD-10-CM | POA: Insufficient documentation

## 2016-10-06 DIAGNOSIS — M810 Age-related osteoporosis without current pathological fracture: Secondary | ICD-10-CM | POA: Insufficient documentation

## 2016-10-06 DIAGNOSIS — R590 Localized enlarged lymph nodes: Secondary | ICD-10-CM | POA: Insufficient documentation

## 2016-10-06 DIAGNOSIS — E785 Hyperlipidemia, unspecified: Secondary | ICD-10-CM | POA: Insufficient documentation

## 2016-10-06 DIAGNOSIS — Z8742 Personal history of other diseases of the female genital tract: Secondary | ICD-10-CM | POA: Insufficient documentation

## 2016-10-06 DIAGNOSIS — M79671 Pain in right foot: Secondary | ICD-10-CM | POA: Diagnosis not present

## 2016-10-06 DIAGNOSIS — D126 Benign neoplasm of colon, unspecified: Secondary | ICD-10-CM | POA: Insufficient documentation

## 2016-10-06 DIAGNOSIS — K219 Gastro-esophageal reflux disease without esophagitis: Secondary | ICD-10-CM | POA: Insufficient documentation

## 2016-10-06 MED ORDER — MELOXICAM 7.5 MG PO TABS: 7.5000 mg | ORAL_TABLET | Freq: Every day | ORAL | 0 refills | Status: DC

## 2016-10-06 NOTE — Progress Notes (Signed)
   Subjective:    Patient ID: Rachael Humphrey, female    DOB: 23-Aug-1930, 83 y.o.   MRN: [id number]  HPI this patient presents to the office with chief complaint of a painful right foot. She states the pain has been present for approximately 9 months. Patient states that she has been seen by Dr. Elvina Mattes who has provided with injection therapy on 2 different occasions. She says the pain continues. She says the pain occurs more when she is walking and from direct pressure. She says she also experiences pain through the whole foot as she walks on her right foot. She presents the office today for an evaluation of her right foot to determine the cause of her whole foot pain    Review of Systems  All other systems reviewed and are negative.      Objective:   Physical Exam GENERAL APPEARANCE: Alert, conversant. Appropriately groomed. No acute distress.  VASCULAR: Pedal pulses are  palpable at  [facility] and PT bilateral.  Capillary refill time is immediate to all digits,  Normal temperature gradient.   NEUROLOGIC: sensation is normal to 5.07 monofilament at 5/5 sites bilateral.  Light touch is intact bilateral, Muscle strength normal.  MUSCULOSKELETAL: acceptable muscle strength, tone and stability bilateral.  Intrinsic muscluature intact bilateral.  Rectus appearance of foot and digits noted bilateral. Bony exostosis TNJ right foot.  Radiating pain on palpabion of exostosis.  Palpable pain exostosis secondary arthritis right foot.  DERMATOLOGIC: skin color, texture, and turgor are within normal limits.  No preulcerative lesions or ulcers  are seen, no interdigital maceration noted.  No open lesions present.  Digital nails are asymptomatic. No drainage noted.         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  Changed her laces.  Prescribed Mobic 7.5 mg with instructions take one bid. If she has reaction immediately stop taking  medicine. RTC 1 week.   Gardiner Barefoot DPM

## 2016-10-13 ENCOUNTER — Encounter: Payer: Self-pay | Admitting: Podiatry

## 2016-10-13 ENCOUNTER — Ambulatory Visit (INDEPENDENT_AMBULATORY_CARE_PROVIDER_SITE_OTHER): Payer: Medicare Other | Admitting: Podiatry

## 2016-10-13 DIAGNOSIS — M129 Arthropathy, unspecified: Secondary | ICD-10-CM

## 2016-10-13 DIAGNOSIS — M792 Neuralgia and neuritis, unspecified: Secondary | ICD-10-CM | POA: Diagnosis not present

## 2016-10-13 NOTE — Progress Notes (Addendum)
   Subjective:    Patient ID: Rachael Humphrey, female    DOB: 06/22/1930, 83 y.o.   MRN: [id number]  HPI this patient presents to the office follow up for neuralgia right foot due to midfoot arthritis.  She says she has improved about 35% since her last visit.  She says she was doing well until yesterday when the pain returned.  She remarked she believes it could be due to weather or her shoes.  She says she has laced her shoes so that there is no pressure on the bone right midfoot.  She has taken Mobic which has also helped reduce her pain.  She returns for continued evaluation and treatment.    Review of Systems  All other systems reviewed and are negative.      Objective:   Physical Exam GENERAL APPEARANCE: Alert, conversant. Appropriately groomed. No acute distress.  VASCULAR: Pedal pulses are  palpable at  [facility] and PT bilateral.  Capillary refill time is immediate to all digits,  Normal temperature gradient.   NEUROLOGIC: sensation is normal to 5.07 monofilament at 5/5 sites bilateral.  Light touch is intact bilateral, Muscle strength normal.  MUSCULOSKELETAL: acceptable muscle strength, tone and stability bilateral.  Intrinsic muscluature intact bilateral.  Rectus appearance of foot and digits noted bilateral. Bony exostosis TNJ right foot.  Reduction of pain compared to last week over her right foot.  DERMATOLOGIC: skin color, texture, and turgor are within normal limits.  No preulcerative lesions or ulcers  are seen, no interdigital maceration noted.  No open lesions present.  Digital nails are asymptomatic. No drainage noted.         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  RTC prn.  Patient was also told to use capascain medicine topically.   Gardiner Barefoot DPM

## 2016-11-02 ENCOUNTER — Other Ambulatory Visit: Payer: Self-pay | Admitting: Podiatry

## 2016-12-01 ENCOUNTER — Other Ambulatory Visit: Payer: Self-pay | Admitting: Podiatry

## 2016-12-01 NOTE — Telephone Encounter (Signed)
Pt needs an appt prior to future refills. 

## 2017-02-12 ENCOUNTER — Observation Stay
Admission: EM | Admit: 2017-02-12 | Discharge: 2017-02-14 | Disposition: A | Discharge status: Home / Self Care | Payer: Medicare Other | Attending: Internal Medicine | Admitting: Internal Medicine

## 2017-02-12 ENCOUNTER — Emergency Department: Payer: Medicare Other

## 2017-02-12 ENCOUNTER — Encounter: Payer: Self-pay | Admitting: Emergency Medicine

## 2017-02-12 DIAGNOSIS — Z9181 History of falling: Secondary | ICD-10-CM | POA: Insufficient documentation

## 2017-02-12 DIAGNOSIS — E039 Hypothyroidism, unspecified: Secondary | ICD-10-CM | POA: Diagnosis not present

## 2017-02-12 DIAGNOSIS — Z87442 Personal history of urinary calculi: Secondary | ICD-10-CM | POA: Insufficient documentation

## 2017-02-12 DIAGNOSIS — W1839XA Other fall on same level, initial encounter: Secondary | ICD-10-CM | POA: Diagnosis not present

## 2017-02-12 DIAGNOSIS — K449 Diaphragmatic hernia without obstruction or gangrene: Secondary | ICD-10-CM | POA: Insufficient documentation

## 2017-02-12 DIAGNOSIS — E785 Hyperlipidemia, unspecified: Secondary | ICD-10-CM | POA: Diagnosis not present

## 2017-02-12 DIAGNOSIS — Z7902 Long term (current) use of antithrombotics/antiplatelets: Secondary | ICD-10-CM | POA: Insufficient documentation

## 2017-02-12 DIAGNOSIS — I700 Atherosclerosis of aorta: Secondary | ICD-10-CM | POA: Diagnosis not present

## 2017-02-12 DIAGNOSIS — S0990XA Unspecified injury of head, initial encounter: Secondary | ICD-10-CM

## 2017-02-12 DIAGNOSIS — I2510 Atherosclerotic heart disease of native coronary artery without angina pectoris: Secondary | ICD-10-CM | POA: Insufficient documentation

## 2017-02-12 DIAGNOSIS — Z8673 Personal history of transient ischemic attack (TIA), and cerebral infarction without residual deficits: Secondary | ICD-10-CM | POA: Diagnosis not present

## 2017-02-12 DIAGNOSIS — N390 Urinary tract infection, site not specified: Secondary | ICD-10-CM | POA: Diagnosis not present

## 2017-02-12 DIAGNOSIS — G8929 Other chronic pain: Secondary | ICD-10-CM | POA: Diagnosis not present

## 2017-02-12 DIAGNOSIS — Z888 Allergy status to other drugs, medicaments and biological substances status: Secondary | ICD-10-CM | POA: Insufficient documentation

## 2017-02-12 DIAGNOSIS — S22060A Wedge compression fracture of T7-T8 vertebra, initial encounter for closed fracture: Secondary | ICD-10-CM | POA: Diagnosis not present

## 2017-02-12 DIAGNOSIS — Z85038 Personal history of other malignant neoplasm of large intestine: Secondary | ICD-10-CM | POA: Insufficient documentation

## 2017-02-12 DIAGNOSIS — Z803 Family history of malignant neoplasm of breast: Secondary | ICD-10-CM | POA: Insufficient documentation

## 2017-02-12 DIAGNOSIS — Z882 Allergy status to sulfonamides status: Secondary | ICD-10-CM | POA: Insufficient documentation

## 2017-02-12 DIAGNOSIS — Z9842 Cataract extraction status, left eye: Secondary | ICD-10-CM | POA: Insufficient documentation

## 2017-02-12 DIAGNOSIS — T797XXA Traumatic subcutaneous emphysema, initial encounter: Secondary | ICD-10-CM | POA: Insufficient documentation

## 2017-02-12 DIAGNOSIS — Z9882 Breast implant status: Secondary | ICD-10-CM | POA: Insufficient documentation

## 2017-02-12 DIAGNOSIS — S0083XA Contusion of other part of head, initial encounter: Secondary | ICD-10-CM | POA: Diagnosis present

## 2017-02-12 DIAGNOSIS — I252 Old myocardial infarction: Secondary | ICD-10-CM | POA: Insufficient documentation

## 2017-02-12 DIAGNOSIS — J320 Chronic maxillary sinusitis: Secondary | ICD-10-CM | POA: Diagnosis not present

## 2017-02-12 DIAGNOSIS — N200 Calculus of kidney: Secondary | ICD-10-CM | POA: Insufficient documentation

## 2017-02-12 DIAGNOSIS — I119 Hypertensive heart disease without heart failure: Secondary | ICD-10-CM | POA: Insufficient documentation

## 2017-02-12 DIAGNOSIS — K219 Gastro-esophageal reflux disease without esophagitis: Secondary | ICD-10-CM | POA: Diagnosis not present

## 2017-02-12 DIAGNOSIS — E1165 Type 2 diabetes mellitus with hyperglycemia: Secondary | ICD-10-CM | POA: Insufficient documentation

## 2017-02-12 DIAGNOSIS — Z9049 Acquired absence of other specified parts of digestive tract: Secondary | ICD-10-CM | POA: Insufficient documentation

## 2017-02-12 DIAGNOSIS — Z9841 Cataract extraction status, right eye: Secondary | ICD-10-CM | POA: Insufficient documentation

## 2017-02-12 DIAGNOSIS — Z8619 Personal history of other infectious and parasitic diseases: Secondary | ICD-10-CM | POA: Diagnosis not present

## 2017-02-12 DIAGNOSIS — W19XXXA Unspecified fall, initial encounter: Secondary | ICD-10-CM | POA: Diagnosis present

## 2017-02-12 DIAGNOSIS — S0012XA Contusion of left eyelid and periocular area, initial encounter: Principal | ICD-10-CM | POA: Insufficient documentation

## 2017-02-12 DIAGNOSIS — Y9389 Activity, other specified: Secondary | ICD-10-CM | POA: Diagnosis not present

## 2017-02-12 DIAGNOSIS — Z885 Allergy status to narcotic agent status: Secondary | ICD-10-CM | POA: Insufficient documentation

## 2017-02-12 DIAGNOSIS — Z7982 Long term (current) use of aspirin: Secondary | ICD-10-CM | POA: Diagnosis not present

## 2017-02-12 DIAGNOSIS — S01512A Laceration without foreign body of oral cavity, initial encounter: Secondary | ICD-10-CM

## 2017-02-12 DIAGNOSIS — Z823 Family history of stroke: Secondary | ICD-10-CM | POA: Insufficient documentation

## 2017-02-12 DIAGNOSIS — Z955 Presence of coronary angioplasty implant and graft: Secondary | ICD-10-CM | POA: Insufficient documentation

## 2017-02-12 LAB — HEPATIC FUNCTION PANEL
ALT: 27 U/L (ref 14–54)
AST: 35 U/L (ref 15–41)
Albumin: 4.3 g/dL (ref 3.5–5.0)
Alkaline Phosphatase: 80 U/L (ref 38–126)
Bilirubin, Direct: 0.1 mg/dL (ref 0.1–0.5)
Indirect Bilirubin: 0.8 mg/dL (ref 0.3–0.9)
Total Bilirubin: 0.9 mg/dL (ref 0.3–1.2)
Total Protein: 7 g/dL (ref 6.5–8.1)

## 2017-02-12 LAB — BASIC METABOLIC PANEL
Anion gap: 12 (ref 5–15)
BUN: 11 mg/dL (ref 6–20)
CO2: 22 mmol/L (ref 22–32)
Calcium: 9.5 mg/dL (ref 8.9–10.3)
Chloride: 107 mmol/L (ref 101–111)
Creatinine, Ser: 0.67 mg/dL (ref 0.44–1.00)
GFR calc Af Amer: >60 mL/min (ref >60)
GFR calc non Af Amer: >60 mL/min (ref >60)
Glucose, Bld: 171 mg/dL — ABNORMAL HIGH (ref 65–99)
Potassium: 4 mmol/L (ref 3.5–5.1)
Sodium: 141 mmol/L (ref 135–145)

## 2017-02-12 LAB — TYPE AND SCREEN
ABO/RH(D): A POS
ANTIBODY SCREEN: NEGATIVE

## 2017-02-12 LAB — CBC WITH DIFFERENTIAL/PLATELET
Basophils Absolute: 0 10*3/uL (ref 0–0.1)
Basophils Relative: 0 %
Eosinophils Absolute: 0.1 10*3/uL (ref 0–0.7)
Eosinophils Relative: 2 %
HCT: 44.5 % (ref 35.0–47.0)
Hemoglobin: 15.3 g/dL (ref 12.0–16.0)
Lymphocytes Relative: 18 %
Lymphs Abs: 1.6 10*3/uL (ref 1.0–3.6)
MCH: 32.6 pg (ref 26.0–34.0)
MCHC: 34.4 g/dL (ref 32.0–36.0)
MCV: 94.7 fL (ref 80.0–100.0)
Monocytes Absolute: 0.6 10*3/uL (ref 0.2–0.9)
Monocytes Relative: 7 %
Neutro Abs: 6.7 10*3/uL — ABNORMAL HIGH (ref 1.4–6.5)
Neutrophils Relative %: 73 %
Platelets: 157 10*3/uL (ref 150–440)
RBC: 4.7 MIL/uL (ref 3.80–5.20)
RDW: 13.6 % (ref 11.5–14.5)
WBC: 9.1 10*3/uL (ref 3.6–11.0)

## 2017-02-12 LAB — PROTIME-INR
INR: 0.94
Prothrombin Time: 12.6 seconds (ref 11.4–15.2)

## 2017-02-12 LAB — TROPONIN I: Troponin I: <0.03 ng/mL (ref <0.03)

## 2017-02-12 MED ORDER — LIDOCAINE HCL (PF) 1 % IJ SOLN
INTRAMUSCULAR | Status: AC
  Filled 2017-02-12: qty 10

## 2017-02-12 MED ORDER — ADULT MULTIVITAMIN W/MINERALS CH
1.0000 | ORAL_TABLET | Freq: Every day | ORAL | Status: DC
  Administered 2017-02-13 – 2017-02-14 (×2): 1 via ORAL
  Filled 2017-02-12 (×2): qty 1

## 2017-02-12 MED ORDER — ONDANSETRON HCL 4 MG PO TABS: 4.0000 mg | ORAL_TABLET | Freq: Four times a day (QID) | ORAL | Status: DC | PRN

## 2017-02-12 MED ORDER — SODIUM CHLORIDE 0.9 % IV SOLN
INTRAVENOUS | Status: DC
  Administered 2017-02-12 – 2017-02-14 (×3): via INTRAVENOUS

## 2017-02-12 MED ORDER — LOSARTAN POTASSIUM 50 MG PO TABS
50.0000 mg | ORAL_TABLET | Freq: Every day | ORAL | Status: DC
  Administered 2017-02-12 – 2017-02-14 (×3): 50 mg via ORAL
  Filled 2017-02-12 (×3): qty 1

## 2017-02-12 MED ORDER — METOPROLOL SUCCINATE ER 25 MG PO TB24
25.0000 mg | ORAL_TABLET | Freq: Every morning | ORAL | Status: DC
  Administered 2017-02-12 – 2017-02-14 (×3): 25 mg via ORAL
  Filled 2017-02-12 (×3): qty 1

## 2017-02-12 MED ORDER — CLINDAMYCIN PHOSPHATE 300 MG/50ML IV SOLN
300.0000 mg | Freq: Three times a day (TID) | INTRAVENOUS | Status: DC
  Administered 2017-02-12 – 2017-02-14 (×6): 300 mg via INTRAVENOUS
  Filled 2017-02-12 (×9): qty 50

## 2017-02-12 MED ORDER — LORATADINE 10 MG PO TABS
10.0000 mg | ORAL_TABLET | Freq: Every day | ORAL | Status: DC
  Administered 2017-02-13 – 2017-02-14 (×2): 10 mg via ORAL
  Filled 2017-02-12 (×2): qty 1

## 2017-02-12 MED ORDER — ONDANSETRON HCL 4 MG/2ML IJ SOLN
4.0000 mg | Freq: Once | INTRAMUSCULAR | Status: AC
  Administered 2017-02-12: 4 mg via INTRAVENOUS
  Filled 2017-02-12: qty 2

## 2017-02-12 MED ORDER — MORPHINE SULFATE (PF) 4 MG/ML IV SOLN
4.0000 mg | Freq: Once | INTRAVENOUS | Status: AC
  Administered 2017-02-12: 4 mg via INTRAVENOUS
  Filled 2017-02-12: qty 1

## 2017-02-12 MED ORDER — ENOXAPARIN SODIUM 40 MG/0.4ML ~~LOC~~ SOLN
40.0000 mg | SUBCUTANEOUS | Status: DC
  Administered 2017-02-12 – 2017-02-13 (×2): 40 mg via SUBCUTANEOUS
  Filled 2017-02-12 (×2): qty 0.4

## 2017-02-12 MED ORDER — ACETAMINOPHEN 650 MG RE SUPP: 650.0000 mg | Freq: Four times a day (QID) | RECTAL | Status: DC | PRN

## 2017-02-12 MED ORDER — ACETAMINOPHEN 325 MG PO TABS: 650.0000 mg | ORAL_TABLET | Freq: Four times a day (QID) | ORAL | Status: DC | PRN

## 2017-02-12 MED ORDER — KETOROLAC TROMETHAMINE 15 MG/ML IJ SOLN
15.0000 mg | Freq: Four times a day (QID) | INTRAMUSCULAR | Status: DC | PRN
  Administered 2017-02-12 – 2017-02-13 (×2): 15 mg via INTRAVENOUS
  Filled 2017-02-12 (×2): qty 1

## 2017-02-12 MED ORDER — PANTOPRAZOLE SODIUM 40 MG PO TBEC
40.0000 mg | DELAYED_RELEASE_TABLET | Freq: Every day | ORAL | Status: DC
  Administered 2017-02-12 – 2017-02-14 (×3): 40 mg via ORAL
  Filled 2017-02-12 (×3): qty 1

## 2017-02-12 MED ORDER — TRAMADOL HCL 50 MG PO TABS
50.0000 mg | ORAL_TABLET | Freq: Four times a day (QID) | ORAL | Status: DC | PRN
  Administered 2017-02-12 – 2017-02-14 (×4): 50 mg via ORAL
  Filled 2017-02-12 (×4): qty 1

## 2017-02-12 MED ORDER — CLINDAMYCIN PHOSPHATE 300 MG/50ML IV SOLN
300.0000 mg | Freq: Once | INTRAVENOUS | Status: AC
  Administered 2017-02-12: 300 mg via INTRAVENOUS
  Filled 2017-02-12: qty 50

## 2017-02-12 MED ORDER — NAPHAZOLINE-PHENIRAMINE 0.025-0.3 % OP SOLN
1.0000 [drp] | Freq: Four times a day (QID) | OPHTHALMIC | Status: DC | PRN
  Filled 2017-02-12: qty 5

## 2017-02-12 MED ORDER — LIDOCAINE-EPINEPHRINE 1 %-1:100000 IJ SOLN
10.0000 mL | Freq: Once | INTRAMUSCULAR | Status: DC
  Filled 2017-02-12: qty 10

## 2017-02-12 MED ORDER — MORPHINE SULFATE (PF) 4 MG/ML IV SOLN
4.0000 mg | Freq: Once | INTRAVENOUS | Status: AC
Start: 2017-02-12 — End: 2017-02-12
  Administered 2017-02-12: 4 mg via INTRAVENOUS
  Filled 2017-02-12: qty 1

## 2017-02-12 MED ORDER — DEXAMETHASONE SODIUM PHOSPHATE 10 MG/ML IJ SOLN
10.0000 mg | Freq: Once | INTRAMUSCULAR | Status: AC
  Administered 2017-02-12: 10 mg via INTRAVENOUS
  Filled 2017-02-12: qty 1

## 2017-02-12 MED ORDER — NIFEDIPINE ER 30 MG PO TB24
30.0000 mg | ORAL_TABLET | Freq: Every day | ORAL | Status: DC
  Administered 2017-02-12 – 2017-02-14 (×3): 30 mg via ORAL
  Filled 2017-02-12 (×6): qty 1

## 2017-02-12 MED ORDER — ATORVASTATIN CALCIUM 20 MG PO TABS
20.0000 mg | ORAL_TABLET | Freq: Every day | ORAL | Status: DC
  Administered 2017-02-12 – 2017-02-14 (×3): 20 mg via ORAL
  Filled 2017-02-12 (×3): qty 1

## 2017-02-12 MED ORDER — ONDANSETRON HCL 4 MG/2ML IJ SOLN
4.0000 mg | Freq: Four times a day (QID) | INTRAMUSCULAR | Status: DC | PRN
  Administered 2017-02-12: 4 mg via INTRAVENOUS
  Filled 2017-02-12: qty 2

## 2017-02-12 NOTE — Progress Notes (Signed)
Family Meeting Note  Advance Directive:yes  Today a meeting took place with the spouse.and patient and family The following clinical team members were present during this meeting:MD  The following were discussed:Patient's diagnosis: facila trauma [place] CAD  Patient's progosis: > 12 months and Goals for treatment: DNR  Additional follow-up to be provided: nonne  Time spent during discussion:16 minutes  , , MD

## 2017-02-12 NOTE — H&P (Signed)
[facility] NAME: Rachael Humphrey    MR#:  [id number]  DATE OF BIRTH:  1930-01-02  DATE OF ADMISSION:  02/12/2017  PRIMARY CARE PHYSICIAN: Idelle Crouch, MD   REQUESTING/REFERRING PHYSICIAN: dr Clarene Reamer  CHIEF COMPLAINT:   fall HISTORY OF PRESENT ILLNESS:  Rachael Humphrey  is a 83 y.o. female with a known history of Essential hypertension and CAD/PCI who presents after a fall. Patient reports she was on her way into her house getting the paper when she stumbled on her feet and landed her left face right on a bunch of rocks. She presents to the ER with large hematoma on the left face and swelling. CT scan reported below. She is on aspirin and Plavix. She also fell last week when she was planting flowers on her wheelbarrow. Prior to any these falls she denies chest pain, shortness of breath, dyspnea or neurological deficits. She denies loss of consciousness. Hospitalist was consulted due to intractable pain. She has received several doses of IV pain medications. I have asked the ER physician to call ENT for their expert opinion regarding CT scan and facial trauma.     PAST MEDICAL HISTORY:   Past Medical History:  Diagnosis Date  . Cancer [facility]) 2005   colon cancer  . Complication of anesthesia   . GERD (gastroesophageal reflux disease)   . H/O hiatal hernia   . Hemorrhoids    internal-not a problem now.  . History of kidney stones    multiple  . History of shingles    04-18-14 3 weeks now -pain only left posterior back , no outbreaks  . Hypertension   . Myocardial infarction ([place])    2 months ago, stent placed [facility]  . PONV (postoperative nausea and vomiting)    no recent problems    PAST SURGICAL HISTORY:   Past Surgical History:  Procedure Laterality Date  . APPENDECTOMY    . AUGMENTATION MAMMAPLASTY Bilateral 1978  . BREAST BIOPSY Right    over 30 years ago negative  . BREAST BIOPSY Left    negative over 30 years  ago  . CATARACT EXTRACTION, BILATERAL    . COLECTOMY     partial small and large instestine removed  . CORONARY ANGIOPLASTY WITH STENT PLACEMENT     02-23-14 coranary stent placed.  . CYSTOSCOPY WITH RETROGRADE PYELOGRAM, URETEROSCOPY AND STENT PLACEMENT Bilateral 05/17/2014   Procedure: CYSTOSCOPY WITH RETROGRADE PYELOGRAM, URETEROSCOPY AND STENT PLACEMENT, BASKET STONE REMOVAL;  Surgeon: Alexis Frock, MD;  Location: [facility];  Service: Urology;  Laterality: Bilateral;    SOCIAL HISTORY:   Social History  Substance Use Topics  . Smoking status: Never Smoker  . Smokeless tobacco: Never Used  . Alcohol use No    FAMILY HISTORY:   Family History  Problem Relation Age of Onset  . Stroke Mother   . Breast cancer Sister 64    DRUG ALLERGIES:   Allergies  Allergen Reactions  . Codeine Nausea And Vomiting  . Darvon [Propoxyphene] Other (See Comments)    Altered mental status  altered mental status Altered mental status   . Demerol [Meperidine] Other (See Comments)    Altered mental status  altered mental status Altered mental status   . Gabapentin Other (See Comments)  . Sulfa Antibiotics Other (See Comments)  . Tramadol Other (See Comments)    "makes me crazy"  "makes me crazy"   . Voltaren [Diclofenac Sodium] Other (See Comments)    "  Makes me crazy" "Makes me crazy"  . Zinc     Hurting in lower stomach     REVIEW OF SYSTEMS:   Review of Systems  Constitutional: Negative.  Negative for chills, fever and malaise/fatigue.  HENT: Negative.  Negative for ear discharge, ear pain, hearing loss, nosebleeds and sore throat.   Eyes: Negative.  Negative for blurred vision and pain.  Respiratory: Negative.  Negative for cough, hemoptysis, shortness of breath and wheezing.   Cardiovascular: Negative.  Negative for chest pain, palpitations and leg swelling.  Gastrointestinal: Negative.  Negative for abdominal pain, blood in stool, diarrhea, nausea and vomiting.  Genitourinary:  Negative.  Negative for dysuria.  Musculoskeletal: Positive for falls. Negative for back pain.  Skin:       Facial pain and swelling  Neurological: Negative for dizziness, tremors, speech change, focal weakness, seizures and headaches.  Endo/Heme/Allergies: Negative.  Does not bruise/bleed easily.  Psychiatric/Behavioral: Negative.  Negative for depression, hallucinations and suicidal ideas.    MEDICATIONS AT HOME:   Prior to Admission medications   Medication Sig Start Date End Date Taking? Authorizing Provider  acetaminophen (TYLENOL) 500 MG tablet Take by mouth.    [provider]  aspirin 325 MG tablet Take 325 mg by mouth daily.    [provider]  atorvastatin (LIPITOR) 20 MG tablet Take 20 mg by mouth daily. Reported on 09/18/2015    [provider]  Calcium Carb-Cholecalciferol (CALCIUM 600 + D PO) Take 1 tablet by mouth daily.    [provider]  cetirizine (ZYRTEC) 10 MG tablet Take 10 mg by mouth daily. 07/18/16   [provider]  clopidogrel (PLAVIX) 75 MG tablet TAKE 1 TABLET BY MOUTH EVERY DAY 11/12/15   [provider]  docusate sodium (COLACE) 100 MG capsule TAKE 2 CAPSULES BY MOUTH TWICE A DAY AT 8AM AND AT 5PM 10/02/14   [provider]  fluticasone (FLONASE) 50 MCG/ACT nasal spray PLACE 1 SPRAY INTO BOTH NOSTRILS NIGHTLY AS NEEDED. 11/12/15   [provider]  lidocaine (LIDODERM) 5 % Place 1 patch onto the skin daily as needed (Pain). Remove & Discard patch within 12 hours or as directed by MD    [provider]  losartan (COZAAR) 50 MG tablet TAKE 1 TABLET BY MOUTH EVERY DAY 08/13/16   [provider]  meloxicam (MOBIC) 7.5 MG tablet TAKE 1 TABLET (7.5 MG TOTAL) BY MOUTH DAILY. 12/01/16   Gardiner Barefoot, DPM  metoprolol succinate (TOPROL-XL) 25 MG 24 hr tablet Take 25 mg by mouth every morning.    [provider]  montelukast (SINGULAIR) 10 MG tablet Take by mouth. 12/03/15 12/02/16   [provider]  montelukast (SINGULAIR) 10 MG tablet TAKE 1 TABLET (10 MG TOTAL) BY MOUTH NIGHTLY. 07/18/16   [provider]  Multiple Vitamin (MULTIVITAMIN WITH MINERALS) TABS tablet Take 1 tablet by mouth daily.    [provider]  Multiple Vitamins-Minerals (PRESERVISION AREDS 2 PO) Take by mouth.    [provider]  naphazoline-pheniramine (EYE ALLERGY RELIEF) 0.025-0.3 % ophthalmic solution Place 1 drop into both eyes 4 (four) times daily as needed for irritation.    [provider]  NIFEdipine (PROCARDIA-XL/ADALAT-CC/NIFEDICAL-XL) 30 MG 24 hr tablet Take by mouth. 07/14/16   [provider]  pantoprazole (PROTONIX) 40 MG tablet TAKE 1 TABLET BY MOUTH ONCE A DAY 1 HOUR BEFORE A MEAL 08/13/16   [provider]      VITAL SIGNS:  Blood pressure (!) 158/107,  pulse (!) 50, temperature 98.1 F (36.7 C), temperature source Oral, resp. rate 20, height 5\' 1"  (1.549 m), weight 61.2 kg (135 lb), SpO2 97 %.  PHYSICAL EXAMINATION:   Physical Exam  Constitutional: She is oriented to person, place, and time and well-developed, well-nourished, and in no distress. No distress.  HENT:  Head: Normocephalic.  Eyes: No scleral icterus.  Neck: Normal range of motion. Neck supple. No JVD present. No tracheal deviation present.  Cardiovascular: Normal rate, regular rhythm and normal heart sounds.  Exam reveals no gallop and no friction rub.   No murmur heard. Pulmonary/Chest: Effort normal and breath sounds normal. No respiratory distress. She has no wheezes. She has no rales. She exhibits no tenderness.  Abdominal: Soft. Bowel sounds are normal. She exhibits no distension and no mass. There is no tenderness. There is no rebound and no guarding.  Musculoskeletal: Normal range of motion. She exhibits no edema.  Neurological: She is alert and oriented to person, place, and time.  Skin: No rash noted. There is erythema.  Patient has significant  swelling and erythema with hematoma around her left eye and face. Has dental lacerations.  Psychiatric: Affect and judgment normal.      LABORATORY PANEL:   CBC  Recent Labs Lab 02/12/17 0804  WBC 9.1  HGB 15.3  HCT 44.5  PLT 157   ------------------------------------------------------------------------------------------------------------------  Chemistries   Recent Labs Lab 02/12/17 0804  NA 141  K 4.0  CL 107  CO2 22  GLUCOSE 171*  BUN 11  CREATININE 0.67  CALCIUM 9.5  AST 35  ALT 27  ALKPHOS 80  BILITOT 0.9   ------------------------------------------------------------------------------------------------------------------  Cardiac Enzymes  Recent Labs Lab 02/12/17 0804  TROPONINI <0.03   ------------------------------------------------------------------------------------------------------------------  RADIOLOGY:  Dg Chest 2 View  Result Date: 02/12/2017 CLINICAL DATA:  [place] outside, striking the concrete. Any coagulated. EXAM: CHEST  2 VIEW COMPARISON:  01/24/2014 FINDINGS: Cardiomegaly with left ventricular prominence. Aortic atherosclerosis. Hiatal hernia. Lungs are clear. The vascularity is normal. No acute bone finding. Previously augmented thoracic spinal fractures. Bilateral breast implant densities. IMPRESSION: No active disease. Cardiomegaly with left ventricular prominence. Aortic atherosclerosis. Electronically Signed   By: Nelson Chimes M.D.   On: 02/12/2017 08:06   Ct Head Wo Contrast  Result Date: 02/12/2017 CLINICAL DATA:  Left facial trauma.  On aspirin and Plavix.  Fall. EXAM: CT HEAD WITHOUT CONTRAST CT MAXILLOFACIAL WITHOUT CONTRAST CT CERVICAL SPINE WITHOUT CONTRAST TECHNIQUE: Multidetector CT imaging of the head, cervical spine, and maxillofacial structures were performed using the standard protocol without intravenous contrast. Multiplanar CT image reconstructions of the cervical spine and maxillofacial structures were also generated.  COMPARISON:  04/26/2007 FINDINGS: CT HEAD FINDINGS Brain: There is patchy low attenuation within the subcortical and periventricular white matter compatible with chronic small vessel ischemic disease. Prominence of the sulci and ventricles are identified compatible with brain atrophy. No evidence for acute brain infarct, intracranial hemorrhage or mass. Vascular: No hyperdense vessel or unexpected calcification. Skull: Normal. Negative for fracture or focal lesion. Other: None CT MAXILLOFACIAL FINDINGS Osseous: No fracture or mandibular dislocation. No destructive process. Orbits: Large left-sided periorbital hematoma is identified, image number 27 of series 12. Sinuses: Chronic mucoperiosteal thickening involves the right maxillary sinus. Soft tissues: There is extensive subcutaneous gas identified within the soft tissues of the left side of face. Large left facial hematoma is identified measuring 4.8 by 2.6 by 6.5 cm. CT CERVICAL SPINE FINDINGS Alignment: Normal. Skull base and vertebrae: No acute fracture. No  primary bone lesion or focal pathologic process. Soft tissues and spinal canal: No prevertebral fluid or swelling. No visible canal hematoma. Disc levels: Mild multi level disc space narrowing and ventral endplate spurring is noted extending from C2-3 through C6-7. Upper chest: Negative. Other: None IMPRESSION: 1. No acute intracranial abnormality. Small vessel ischemic disease and brain atrophy noted. 2. No evidence for cervical spine fracture. 3. Large left periorbital and left facial hematomas with extensive subcutaneous emphysema. 4. Chronic right maxillary sinus disease 5. Chronic multi level degenerative disc disease Electronically Signed   By: Kerby Moors M.D.   On: 02/12/2017 08:25   Ct Cervical Spine Wo Contrast  Result Date: 02/12/2017 CLINICAL DATA:  Left facial trauma.  On aspirin and Plavix.  Fall. EXAM: CT HEAD WITHOUT CONTRAST CT MAXILLOFACIAL WITHOUT CONTRAST CT CERVICAL SPINE WITHOUT  CONTRAST TECHNIQUE: Multidetector CT imaging of the head, cervical spine, and maxillofacial structures were performed using the standard protocol without intravenous contrast. Multiplanar CT image reconstructions of the cervical spine and maxillofacial structures were also generated. COMPARISON:  04/26/2007 FINDINGS: CT HEAD FINDINGS Brain: There is patchy low attenuation within the subcortical and periventricular white matter compatible with chronic small vessel ischemic disease. Prominence of the sulci and ventricles are identified compatible with brain atrophy. No evidence for acute brain infarct, intracranial hemorrhage or mass. Vascular: No hyperdense vessel or unexpected calcification. Skull: Normal. Negative for fracture or focal lesion. Other: None CT MAXILLOFACIAL FINDINGS Osseous: No fracture or mandibular dislocation. No destructive process. Orbits: Large left-sided periorbital hematoma is identified, image number 27 of series 12. Sinuses: Chronic mucoperiosteal thickening involves the right maxillary sinus. Soft tissues: There is extensive subcutaneous gas identified within the soft tissues of the left side of face. Large left facial hematoma is identified measuring 4.8 by 2.6 by 6.5 cm. CT CERVICAL SPINE FINDINGS Alignment: Normal. Skull base and vertebrae: No acute fracture. No primary bone lesion or focal pathologic process. Soft tissues and spinal canal: No prevertebral fluid or swelling. No visible canal hematoma. Disc levels: Mild multi level disc space narrowing and ventral endplate spurring is noted extending from C2-3 through C6-7. Upper chest: Negative. Other: None IMPRESSION: 1. No acute intracranial abnormality. Small vessel ischemic disease and brain atrophy noted. 2. No evidence for cervical spine fracture. 3. Large left periorbital and left facial hematomas with extensive subcutaneous emphysema. 4. Chronic right maxillary sinus disease 5. Chronic multi level degenerative disc disease  Electronically Signed   By: Kerby Moors M.D.   On: 02/12/2017 08:25   Ct Maxillofacial Wo Cm  Result Date: 02/12/2017 CLINICAL DATA:  Left facial trauma.  On aspirin and Plavix.  Fall. EXAM: CT HEAD WITHOUT CONTRAST CT MAXILLOFACIAL WITHOUT CONTRAST CT CERVICAL SPINE WITHOUT CONTRAST TECHNIQUE: Multidetector CT imaging of the head, cervical spine, and maxillofacial structures were performed using the standard protocol without intravenous contrast. Multiplanar CT image reconstructions of the cervical spine and maxillofacial structures were also generated. COMPARISON:  04/26/2007 FINDINGS: CT HEAD FINDINGS Brain: There is patchy low attenuation within the subcortical and periventricular white matter compatible with chronic small vessel ischemic disease. Prominence of the sulci and ventricles are identified compatible with brain atrophy. No evidence for acute brain infarct, intracranial hemorrhage or mass. Vascular: No hyperdense vessel or unexpected calcification. Skull: Normal. Negative for fracture or focal lesion. Other: None CT MAXILLOFACIAL FINDINGS Osseous: No fracture or mandibular dislocation. No destructive process. Orbits: Large left-sided periorbital hematoma is identified, image number 27 of series 12. Sinuses: Chronic mucoperiosteal thickening involves the right  maxillary sinus. Soft tissues: There is extensive subcutaneous gas identified within the soft tissues of the left side of face. Large left facial hematoma is identified measuring 4.8 by 2.6 by 6.5 cm. CT CERVICAL SPINE FINDINGS Alignment: Normal. Skull base and vertebrae: No acute fracture. No primary bone lesion or focal pathologic process. Soft tissues and spinal canal: No prevertebral fluid or swelling. No visible canal hematoma. Disc levels: Mild multi level disc space narrowing and ventral endplate spurring is noted extending from C2-3 through C6-7. Upper chest: Negative. Other: None IMPRESSION: 1. No acute intracranial abnormality.  Small vessel ischemic disease and brain atrophy noted. 2. No evidence for cervical spine fracture. 3. Large left periorbital and left facial hematomas with extensive subcutaneous emphysema. 4. Chronic right maxillary sinus disease 5. Chronic multi level degenerative disc disease Electronically Signed   By: Kerby Moors M.D.   On: 02/12/2017 08:25    EKG:  Sinus rhythm with PACs and Q waves in inferior leads which are old  IMPRESSION AND PLAN:   A 17-year-old female with a history of CAD/PCI and essential hypertension who presents after a fall and a suffered large left periorbital and facial hematoma with extensive subcutaneous emphysema  1. Intractable pain due to Extensive left periorbital/facial hematoma with subcutaneous emphysema surprisingly no fractures noted on CT scan I have asked ER physician to call ENT due to the severity of the trauma and the patient is also on aspirin/Plavix. ENT will evaluate patient in the ER. Patient will require IV pain medications as well as oral pain medications. PT consult for Falls  2. CAD/PCI: Due to large hematomas I will hold aspirin and Plavix for now. Continue atorvastatin and metoprolol  3. Essential hypertension: Continue metoprolol and losartan  4. Hyperglycemia: Repeat BMP in a.m.  All the records are reviewed and case discussed with ED provider. Management plans discussed with the patient and she in agreement  CODE STATUS: DNR  TOTAL TIME TAKING CARE OF THIS PATIENT: 49 minutes.    ,  M.D on 02/12/2017 at 10:16 AM  Between 7am to 6pm - Pager - [phone]  After 6pm go to [email or web address] - password [email or web address]  Office  [phone]  CC: Primary care physician; Idelle Crouch, MD

## 2017-02-12 NOTE — Progress Notes (Signed)
Signed by Raechel Chute

## 2017-02-12 NOTE — ED Notes (Signed)
Pt reports falling approximately one week ago and injuring right side of torso. Pt states she did not seek medical attention. Pt reports soreness to the area. Some bruising noted to right side abdomen.

## 2017-02-12 NOTE — ED Provider Notes (Signed)
[facility] Emergency Department Provider Note  ____________________________________________   First MD Initiated Contact with Patient 02/12/17 [phone]     (approximate)  I have reviewed the triage vital signs and the nursing notes.   HISTORY  Chief Complaint Fall and Facial Injury    HPI Rachael Humphrey is a 83 y.o. female who comes to the emergency department via EMS with severe left-sided facial pain after a fall this morning. She says she thinks she tripped on something more going outside although she is not entirely sure. She denies loss of consciousness. She denies preceding chest pain or palpitations. She does report decreased vision in her left eye although she thinks is because is swollen shut. Her pain is severe nonradiating. Worsened with movement and improved with rest. She has a past medical history of coronary artery disease and is on dual antiplatelet therapy with both Plavix and aspirin. She also had a mechanical fall roughly 1 week ago landing on a wheelbarrow on her right chest although she did not seek medical care at that time. She has had sharp pleuritic chest pain ever since. She currently reports no abdominal pain no nausea no vomiting. She denies numbness or weakness.   Past Medical History:  Diagnosis Date  . Cancer [facility]) 2005   colon cancer  . Complication of anesthesia   . GERD (gastroesophageal reflux disease)   . H/O hiatal hernia   . Hemorrhoids    internal-not a problem now.  . History of kidney stones    multiple  . History of shingles    04-18-14 3 weeks now -pain only left posterior back , no outbreaks  . Hypertension   . Myocardial infarction ([place])    2 months ago, stent placed [facility]  . PONV (postoperative nausea and vomiting)    no recent problems    Patient Active Problem List   Diagnosis Date Noted  . Fall 02/12/2017  . Acquired hypothyroidism 10/06/2016  . Arm fracture 10/06/2016  . Benign neoplasm of colon  10/06/2016  . Cervical adenopathy 10/06/2016  . Benign essential hypertension 10/06/2016  . GERD (gastroesophageal reflux disease) 10/06/2016  . Hiatal hernia 10/06/2016  . History of abnormal cervical Pap smear 10/06/2016  . History of TIA (transient ischemic attack) 10/06/2016  . Hyperlipidemia, unspecified 10/06/2016  . Nephrolithiasis 10/06/2016  . Senile osteoporosis 10/06/2016  . Type 2 diabetes mellitus without complication ([place]) 85/63/1497  . Rupture of implant of right breast 09/18/2015  . Chronic back pain 03/08/2014  . Compression fracture 03/08/2014  . Coronary artery disease 03/03/2014  . Wedge compression fracture of T7 vertebra ([place]) 02/22/2014    Past Surgical History:  Procedure Laterality Date  . APPENDECTOMY    . AUGMENTATION MAMMAPLASTY Bilateral 1978  . BREAST BIOPSY Right    over 30 years ago negative  . BREAST BIOPSY Left    negative over 30 years ago  . CATARACT EXTRACTION, BILATERAL    . COLECTOMY     partial small and large instestine removed  . CORONARY ANGIOPLASTY WITH STENT PLACEMENT     02-23-14 coranary stent placed.  . CYSTOSCOPY WITH RETROGRADE PYELOGRAM, URETEROSCOPY AND STENT PLACEMENT Bilateral 05/17/2014   Procedure: CYSTOSCOPY WITH RETROGRADE PYELOGRAM, URETEROSCOPY AND STENT PLACEMENT, BASKET STONE REMOVAL;  Surgeon: Alexis Frock, MD;  Location: [facility];  Service: Urology;  Laterality: Bilateral;    Prior to Admission medications   Medication Sig Start Date End Date Taking? Authorizing Provider  aspirin 325 MG tablet Take 325 mg  by mouth daily.   Yes [provider]  atorvastatin (LIPITOR) 20 MG tablet Take 20 mg by mouth daily. Reported on 09/18/2015   Yes [provider]  Calcium Carb-Cholecalciferol (CALCIUM 600 + D PO) Take 1 tablet by mouth daily.   Yes [provider]  cetirizine (ZYRTEC) 10 MG tablet Take 10 mg by mouth daily. 07/18/16  Yes [provider]  clopidogrel (PLAVIX) 75 MG tablet TAKE 1  TABLET BY MOUTH EVERY DAY 11/12/15  Yes [provider]  levothyroxine (SYNTHROID, LEVOTHROID) 25 MCG tablet TAKE 1 TABLET ONCE DAILY ON EMPTY STOMACH WITH GLASS OF WATER 30-60 MIN BEFORE BREAKFAST 12/10/16  Yes [provider]  losartan (COZAAR) 50 MG tablet TAKE 1 TABLET BY MOUTH EVERY DAY 08/13/16  Yes [provider]  metoprolol succinate (TOPROL-XL) 25 MG 24 hr tablet Take 25 mg by mouth every morning.   Yes [provider]  montelukast (SINGULAIR) 10 MG tablet TAKE 1 TABLET (10 MG TOTAL) BY MOUTH NIGHTLY. 07/18/16  Yes [provider]  Multiple Vitamin (MULTIVITAMIN WITH MINERALS) TABS tablet Take 1 tablet by mouth daily.   Yes [provider]  Multiple Vitamins-Minerals (PRESERVISION AREDS 2 PO) Take by mouth.   Yes [provider]  pantoprazole (PROTONIX) 40 MG tablet TAKE 1 TABLET BY MOUTH ONCE A DAY 1 HOUR BEFORE A MEAL 08/13/16  Yes [provider]  acetaminophen (TYLENOL) 500 MG tablet Take by mouth.    [provider]  docusate sodium (COLACE) 100 MG capsule TAKE 2 CAPSULES BY MOUTH TWICE A DAY AT 8AM AND AT 5PM 10/02/14   [provider]  fluticasone (FLONASE) 50 MCG/ACT nasal spray PLACE 1 SPRAY INTO BOTH NOSTRILS NIGHTLY AS NEEDED. 11/12/15   [provider]  meloxicam (MOBIC) 7.5 MG tablet TAKE 1 TABLET (7.5 MG TOTAL) BY MOUTH DAILY. Patient not taking: Reported on 02/12/2017 12/01/16   Gardiner Barefoot, DPM  montelukast (SINGULAIR) 10 MG tablet Take by mouth. 12/03/15 12/02/16  [provider]  naphazoline-pheniramine (EYE ALLERGY RELIEF) 0.025-0.3 % ophthalmic solution Place 1 drop into both eyes 4 (four) times daily as needed for irritation.    [provider]    Allergies Codeine; Darvon [propoxyphene]; Demerol [meperidine]; Gabapentin; Sulfa antibiotics; Tramadol; Voltaren [diclofenac sodium]; and Zinc  Family History  Problem Relation Age of Onset  . Stroke Mother     . Breast cancer Sister 59    Social History Social History  Substance Use Topics  . Smoking status: Never Smoker  . Smokeless tobacco: Never Used  . Alcohol use No    Review of Systems Constitutional: No fever/chills Eyes: Positive visual changes. ENT: No sore throat. Cardiovascular: Positive chest pain. Respiratory: Denies shortness of breath. Gastrointestinal: No abdominal pain.  No nausea, no vomiting.  No diarrhea.  No constipation. Genitourinary: Negative for dysuria. Musculoskeletal: Negative for back pain. Skin: Negative for rash. Neurological: Negative for headaches, focal weakness or numbness.   ____________________________________________   PHYSICAL EXAM:  VITAL SIGNS: ED Triage Vitals  Enc Vitals Group     BP      Pulse      Resp      Temp      Temp src      SpO2      Weight      Height      Head Circumference      Peak Flow      Pain Score      Pain Loc  Pain Edu?      Excl. in [place]?     Constitutional: Alert and oriented 4 pleasant cooperative speaks and slurred sentences secondary to facial trauma Eyes: PERRL EOMI. left eye swollen nearly shut. Normal visual acuity bilaterally Head: Significant left-sided facial trauma although low the left eye with left eye swollen nearly shut she is tender over her zygoma and maxilla difficult to fully evaluate inside the mouth secondary to tense hematoma. Nose: No congestion/rhinnorhea. Mouth/Throat: No trismus Neck: No stridor.   Cardiovascular: Normal rate, regular rhythm. Grossly normal heart sounds.  Good peripheral circulation. Respiratory: Normal respiratory effort.  No retractions. Lungs CTAB and moving good air Gastrointestinal: Soft nontender Musculoskeletal: No lower extremity edema   Neurologic:  Normal speech and language. No gross focal neurologic deficits are appreciated. Skin:  Skin is warm, dry and intact. No rash noted. Psychiatric: Mood and affect are normal. Speech and behavior are  normal.    ____________________________________________   DIFFERENTIAL  Intracerebral hemorrhage, facial fracture, cervical spine fracture, dental infection   LABS (all labs ordered are listed, but only abnormal results are displayed)  Labs Reviewed  CBC WITH DIFFERENTIAL/PLATELET - Abnormal; Notable for the following:       Result Value   Neutro Abs 6.7 (*)    All other components within normal limits  BASIC METABOLIC PANEL - Abnormal; Notable for the following:    Glucose, Bld 171 (*)    All other components within normal limits  HEPATIC FUNCTION PANEL  TROPONIN I  PROTIME-INR  URINALYSIS, COMPLETE (UACMP) WITH MICROSCOPIC  TYPE AND SCREEN    No signs of acute ischemia  EKG   ____________________________________________  RADIOLOGY  CT scan of the head with no bleed, face with no fracture, cervical spine with no fracture dislocation Chest x-ray with no acute disease ____________________________________________   PROCEDURES  Procedure(s) performed: no  Procedures  Critical Care performed: no  Observation: no ____________________________________________   INITIAL IMPRESSION / ASSESSMENT AND PLAN / ED COURSE  Pertinent labs & imaging results that were available during my care of the patient were reviewed by me and considered in my medical decision making (see chart for details).  Fortunately the patient is neurologically intact although she has clear significant left-sided facial trauma concerning for intracerebral hemorrhage, cervical spine fracture, or most likely facial fractures.     ----------------------------------------- 9:35 AM on 02/12/2017 -----------------------------------------  Fortunately the patient's CT scans are negative for acute fracture or bleeding. She is requiring multiple doses of intravenous opioid pain medication which is to be expected with the severity of the soft tissue swelling and discomfort. At this point she cannot  swallow oral pills and her pain is too intense for her to go home.  ----------------------------------------- 10:12 AM on 02/12/2017 -----------------------------------------  I discussed the case with Dr. Pryor Ochoa of ENT regarding the patient's facial hematoma and he will kindly come consult on the patient now. ____________________________________________   FINAL CLINICAL IMPRESSION(S) / ED DIAGNOSES  Final diagnoses:  Fall, initial encounter  Contusion of face, initial encounter  Injury of head, initial encounter  Laceration of floor of mouth, initial encounter      NEW MEDICATIONS STARTED DURING THIS VISIT:  Current Discharge Medication List       Note:  This document was prepared using Dragon voice recognition software and may include unintentional dictation errors.     Darel Hong, MD 02/12/17 [phone]

## 2017-02-12 NOTE — Consult Note (Signed)
Rachael Humphrey, Rachael Humphrey [id number] 04/20/1930 Bettey Costa, MD  Reason for Consult: Facial hematoma and laceration  HPI: 83 y.o. Female s/p fall on rocks/concrete with large facial hematoma and small laceration.  Underwent CT scan that showed large collection of blood and periorbital hematoma.  Patient reports left sided facial pain and difficulty with movement of left face due to significant swelling.  Allergies:  Allergies  Allergen Reactions  . Codeine Nausea And Vomiting  . Darvon [Propoxyphene] Other (See Comments)    Altered mental status  altered mental status Altered mental status   . Demerol [Meperidine] Other (See Comments)    Altered mental status  altered mental status Altered mental status   . Gabapentin Other (See Comments)  . Sulfa Antibiotics Other (See Comments)  . Tramadol Other (See Comments)    "makes me crazy"  "makes me crazy"   . Voltaren [Diclofenac Sodium] Other (See Comments)    "Makes me crazy" "Makes me crazy"  . Zinc     Hurting in lower stomach     ROS: Review of systems normal other than 12 systems except per HPI.  PMH:  Past Medical History:  Diagnosis Date  . Cancer [facility]) 2005   colon cancer  . Complication of anesthesia   . GERD (gastroesophageal reflux disease)   . H/O hiatal hernia   . Hemorrhoids    internal-not a problem now.  . History of kidney stones    multiple  . History of shingles    04-18-14 3 weeks now -pain only left posterior back , no outbreaks  . Hypertension   . Myocardial infarction ([place])    2 months ago, stent placed [facility]  . PONV (postoperative nausea and vomiting)    no recent problems    FH:  Family History  Problem Relation Age of Onset  . Stroke Mother   . Breast cancer Sister 69    SH:  Social History   Social History  . Marital status: Married    Spouse name: N/A  . Number of children: N/A  . Years of education: N/A   Occupational History  . Not on file.   Social History Main Topics  . Smoking  status: Never Smoker  . Smokeless tobacco: Never Used  . Alcohol use No  . Drug use: No  . Sexual activity: Yes   Other Topics Concern  . Not on file   Social History Narrative  . No narrative on file    PSH:  Past Surgical History:  Procedure Laterality Date  . APPENDECTOMY    . AUGMENTATION MAMMAPLASTY Bilateral 1978  . BREAST BIOPSY Right    over 30 years ago negative  . BREAST BIOPSY Left    negative over 30 years ago  . CATARACT EXTRACTION, BILATERAL    . COLECTOMY     partial small and large instestine removed  . CORONARY ANGIOPLASTY WITH STENT PLACEMENT     02-23-14 coranary stent placed.  . CYSTOSCOPY WITH RETROGRADE PYELOGRAM, URETEROSCOPY AND STENT PLACEMENT Bilateral 05/17/2014   Procedure: CYSTOSCOPY WITH RETROGRADE PYELOGRAM, URETEROSCOPY AND STENT PLACEMENT, BASKET STONE REMOVAL;  Surgeon: Alexis Frock, MD;  Location: [facility];  Service: Urology;  Laterality: Bilateral;    Physical  Exam:  GEN-  NAD, supine in bed FACE-  Significant left sided facial swelling and edema with tense hematoma and bruising extending from supraorbital region down across maxilla to midline and down past left mandbile.  1cm left upper lip laceration. EARS-  External ears clear NOSE-  clear anteriorly OC/OP-  Significant tense edema of buccal mucosa and superficial laceration but no deep lacerations or loose teeth noted.  CT scan reviewed that showed large left sided hematoma of 6cm and chronic right maxillary sinusitis  A/P: Large facial hematoma and small facial laceration  Plan:  Hematoma partially drained.  Anticipate further drainage of blood from I&D site for next day or two.  Agree with Ice for 48 hours and then heat.  Will re-evaluate later today.  Suture removal in 1 week.  Agree with clindamycin.   , 02/12/2017 11:50 AM

## 2017-02-12 NOTE — Procedures (Signed)
..  02/12/2017  11:32 AM    Quentin Mulling  [id number]   Pre-Op Dx:  Fall, Left facial laceration 1cm, Left facial hematoma 6cm  Post-op Dx: same  Proc: 1)  Trans-oral incision and drainage of left facial hematoma  2)  Simple closure of left facial laceration 1cm  Surg:  ,  Anes:  Local  EBL:  51ml  Comp:  none  Findings:  Large left sided periorbital and facial hematoma involving most of patient's left face.  Large hematoma pocket entered transorally with about 71ml of sanguinous drainage.  Small gaping laceration loosely closed on left upper lip adjacent to the vermillion border.  Procedure: After the patient was identified in the ER and verbal consent was obtained.  The patient's left upper lip and buccal mucosa was anesthetized with 42ml of 1% lidocaine and 1:100,000 epinephrine.  This was allowed several minutes for effect.  The lacerations were thoroughly washed with sterile saline.  Once fresh wound edges were ensured, attention was directed to closure of the patient's 1cm lip laceration.  Interupted ethilon 5.0 suture was used to close loosely to allow for continued drainage from hematoma underneath.  At this time, attention was directed to the patient's hematoma.  Intra-orally an 11 blade scalpel was used to incise the bucca mucosa just above the mandibular teeth.  Blunt dissection with hemastat was used to enter the hematoma site and approximately 47ml of drainage was removed.  Slow trickle of bleeding was present as well and was removed with suction.  At this time with all lacerations closed and the hematoma slowly decreasing in size, care of the patient was transferred to the ER.  Dispo:   Admit to medicine for observation.  Plan:  To follow up in one week for suture removal.  Ice for 48 hours.  ,  02/12/2017 11:32 AM

## 2017-02-12 NOTE — Care Management Obs Status (Signed)
[facility] NOTIFICATION   Patient Details  Name: KIMBER FRITTS MRN: [id number] Date of Birth: [birth year]   Medicare Observation Status Notification Given:  Yes    Beau Fanny, RN 02/12/2017, 10:17 AM

## 2017-02-12 NOTE — ED Triage Notes (Signed)
Pt arrived via EMS from home. Pt stumbled this morning while getting the paper and hit face on concrete. No LOC.  Pt arrived with a large amount of swelling noted to left side of face and bruising. Pt on plavix. Pt alert and oriented on arrival. EMS reports VSS.

## 2017-02-12 NOTE — Progress Notes (Signed)
Admission: Patient alert and oriented. Facial bruising on the right ans swelling. Laceration on the left lip that has stitches from ED. Patient has no other skin issues. Some bloody drainage from mouth that has decreased. Patient stated she fell outside getting the newspaper. Patient complaining of moderate pain that is relieved by pain medication and ice. Patient oriented to room and call bell system.  Deri Fuelling, RN

## 2017-02-12 NOTE — Progress Notes (Signed)
PT Cancellation Note  Patient Details Name: Rachael Humphrey MRN: [id number] DOB: [birth year]   Cancelled Treatment:    Reason Eval/Treat Not Completed: Patient not medically ready (Consult received and chart reviewed. Per discussion with primary RN, recommends hold this date due to extensive swelling, discomfort post-injury. Will re-attempt next date as medically appropriate.)    H. Owens Shark, PT, DPT, NCS 02/12/17, 4:13 PM [phone]

## 2017-02-13 LAB — URINALYSIS, COMPLETE (UACMP) WITH MICROSCOPIC
BACTERIA UA: NONE SEEN
BILIRUBIN URINE: NEGATIVE
Glucose, UA: NEGATIVE mg/dL
HGB URINE DIPSTICK: NEGATIVE
KETONES UR: NEGATIVE mg/dL
NITRITE: NEGATIVE
PH: 6 (ref 5.0–8.0)
Protein, ur: NEGATIVE mg/dL
SPECIFIC GRAVITY, URINE: 1.02 (ref 1.005–1.030)

## 2017-02-13 LAB — BASIC METABOLIC PANEL
Anion gap: 8 (ref 5–15)
BUN: 19 mg/dL (ref 6–20)
CALCIUM: 8.5 mg/dL — AB (ref 8.9–10.3)
CO2: 24 mmol/L (ref 22–32)
CREATININE: 0.71 mg/dL (ref 0.44–1.00)
Chloride: 105 mmol/L (ref 101–111)
GFR calc Af Amer: >60 mL/min (ref >60)
Glucose, Bld: 109 mg/dL — ABNORMAL HIGH (ref 65–99)
Potassium: 4.3 mmol/L (ref 3.5–5.1)
Sodium: 137 mmol/L (ref 135–145)

## 2017-02-13 MED ORDER — DEXTROSE 5 % IV SOLN
1.0000 g | INTRAVENOUS | Status: DC
  Administered 2017-02-13: 1 g via INTRAVENOUS
  Filled 2017-02-13 (×2): qty 10

## 2017-02-13 NOTE — Progress Notes (Signed)
..   02/13/2017 12:44 PM  Rachael Humphrey [id number]  Post-Op Day 1    Temp:  [97.6 F (36.4 C)-98.1 F (36.7 C)] 98.1 F (36.7 C) (06/01 0734) Pulse Rate:  [77-96] 77 (06/01 0734) Resp:  [16-18] 18 (06/01 0734) BP: (125-140)/(71-88) 125/71 (06/01 0734) SpO2:  [92 %-94 %] 92 % (06/01 0734),     Intake/Output Summary (Last 24 hours) at 02/13/17 1244 Last data filed at 02/12/17 1600  Gross per 24 hour  Intake              155 ml  Output                0 ml  Net              155 ml    Results for orders placed or performed during the hospital encounter of 02/12/17 (from the past 24 hour(s))  Urinalysis, Complete w Microscopic     Status: Abnormal   Collection Time: 02/13/17  7:27 AM  Result Value Ref Range   Color, Urine YELLOW (A) YELLOW   APPearance CLEAR (A) CLEAR   Specific Gravity, Urine 1.020 1.005 - 1.030   pH 6.0 5.0 - 8.0   Glucose, UA NEGATIVE NEGATIVE mg/dL   Hgb urine dipstick NEGATIVE NEGATIVE   Bilirubin Urine NEGATIVE NEGATIVE   Ketones, ur NEGATIVE NEGATIVE mg/dL   Protein, ur NEGATIVE NEGATIVE mg/dL   Nitrite NEGATIVE NEGATIVE   Leukocytes, UA TRACE (A) NEGATIVE   RBC / HPF 0-5 0 - 5 RBC/hpf   WBC, UA 6-30 0 - 5 WBC/hpf   Bacteria, UA NONE SEEN NONE SEEN   Squamous Epithelial / LPF 0-5 (A) NONE SEEN   Mucous PRESENT   Basic metabolic panel     Status: Abnormal   Collection Time: 02/13/17  7:45 AM  Result Value Ref Range   Sodium 137 135 - 145 mmol/L   Potassium 4.3 3.5 - 5.1 mmol/L   Chloride 105 101 - 111 mmol/L   CO2 24 22 - 32 mmol/L   Glucose, Bld 109 (H) 65 - 99 mg/dL   BUN 19 6 - 20 mg/dL   Creatinine, Ser 0.71 0.44 - 1.00 mg/dL   Calcium 8.5 (L) 8.9 - 10.3 mg/dL   GFR calc non Af Amer >60 >60 mL/min   GFR calc Af Amer >60 >60 mL/min   Anion gap 8 5 - 15    SUBJECTIVE:  Feels much better today.  Can see out of left eye now due to decrease in swelling.  Pain improved and continuing to use ice.  Tolerating liquids and can drink well from  straw.  OBJECTIVE:   GEN-  Continued significant ecchymosis and edema of left face but significantly improved from yesterday.   OC/OP- improved intraoral and buccal edema and bruising but continued  IMPRESSION:  S/p fall with extensive left facial hematoma/laceration s/p repair and I&D  PLAN:  Given interval improvement and improved PO intake.  OK to discharge per ENT perspective with follow up in 1 week.  Continue ice for another 24 hours and then heat at that time.  Recommend soft mechanical diet due to limited ability to chew on left side.  Discussed options with patient.  , 02/13/2017, 12:44 PM

## 2017-02-13 NOTE — Progress Notes (Signed)
Urine sample obtained and sent to the lab.  Rachael Humphrey

## 2017-02-13 NOTE — Progress Notes (Signed)
[facility] [place] NAME: Rachael Humphrey    MR#:  [id number]  DATE OF BIRTH:  12-30-1929  SUBJECTIVE:  CHIEF COMPLAINT:   Chief Complaint  Patient presents with  . Fall  . Facial Injury     Came after a fall on driveway- facial injury and hematoma, was on ASA and Plavix.   Hematoma drained by ENT, feels better today.  REVIEW OF SYSTEMS:  CONSTITUTIONAL: No fever, fatigue or weakness.  EYES: No blurred or double vision.  EARS, NOSE, AND THROAT: No tinnitus or ear pain.  RESPIRATORY: No cough, shortness of breath, wheezing or hemoptysis.  CARDIOVASCULAR: No chest pain, orthopnea, edema.  GASTROINTESTINAL: No nausea, vomiting, diarrhea or abdominal pain.  GENITOURINARY: No dysuria, hematuria.  ENDOCRINE: No polyuria, nocturia,  HEMATOLOGY: No anemia, easy bruising or bleeding SKIN: No rash or lesion. MUSCULOSKELETAL: No joint pain or arthritis.   NEUROLOGIC: No tingling, numbness, weakness.  PSYCHIATRY: No anxiety or depression.   ROS  DRUG ALLERGIES:   Allergies  Allergen Reactions  . Codeine Nausea And Vomiting  . Darvon [Propoxyphene] Other (See Comments)    Altered mental status  altered mental status Altered mental status   . Demerol [Meperidine] Other (See Comments)    Altered mental status  altered mental status Altered mental status   . Gabapentin Other (See Comments)  . Sulfa Antibiotics Other (See Comments)  . Tramadol Other (See Comments)    "makes me crazy"  "makes me crazy"   . Voltaren [Diclofenac Sodium] Other (See Comments)    "Makes me crazy" "Makes me crazy"  . Zinc     Hurting in lower stomach     VITALS:  Blood pressure 110/65, pulse 66, temperature 98.1 F (36.7 C), temperature source Oral, resp. rate 18, height 5\' 1"  (1.549 m), weight 61.2 kg (135 lb), SpO2 96 %.  PHYSICAL EXAMINATION:  GENERAL:  83 y.o.-year-old patient lying in the bed with no acute distress.  EYES: Pupils equal, round,  reactive to light and accommodation. No scleral icterus. Extraocular muscles intact.  HEENT: left side face and peri orbital hematoma and echymosis, normocephalic. Oropharynx and nasopharynx clear.  NECK:  Supple, no jugular venous distention. No thyroid enlargement, no tenderness.  LUNGS: Normal breath sounds bilaterally, no wheezing, rales,rhonchi or crepitation. No use of accessory muscles of respiration.  CARDIOVASCULAR: S1, S2 normal. No murmurs, rubs, or gallops.  ABDOMEN: Soft, nontender, nondistended. Bowel sounds present. No organomegaly or mass.  EXTREMITIES: No pedal edema, cyanosis, or clubbing.  NEUROLOGIC: Cranial nerves II through XII are intact. Muscle strength 5/5 in all extremities. Sensation intact. Gait not checked.  PSYCHIATRIC: The patient is alert and oriented x 3.  SKIN: No obvious rash, lesion, or ulcer.   Physical Exam LABORATORY PANEL:   CBC  Recent Labs Lab 02/12/17 0804  WBC 9.1  HGB 15.3  HCT 44.5  PLT 157   ------------------------------------------------------------------------------------------------------------------  Chemistries   Recent Labs Lab 02/12/17 0804 02/13/17 0745  NA 141 137  K 4.0 4.3  CL 107 105  CO2 22 24  GLUCOSE 171* 109*  BUN 11 19  CREATININE 0.67 0.71  CALCIUM 9.5 8.5*  AST 35  --   ALT 27  --   ALKPHOS 80  --   BILITOT 0.9  --    ------------------------------------------------------------------------------------------------------------------  Cardiac Enzymes  Recent Labs Lab 02/12/17 0804  TROPONINI <0.03   ------------------------------------------------------------------------------------------------------------------  RADIOLOGY:  Dg Chest 2 View  Result Date: 02/12/2017 CLINICAL DATA:  [place] outside, striking the concrete. Any coagulated. EXAM: CHEST  2 VIEW COMPARISON:  01/24/2014 FINDINGS: Cardiomegaly with left ventricular prominence. Aortic atherosclerosis. Hiatal hernia. Lungs are clear. The  vascularity is normal. No acute bone finding. Previously augmented thoracic spinal fractures. Bilateral breast implant densities. IMPRESSION: No active disease. Cardiomegaly with left ventricular prominence. Aortic atherosclerosis. Electronically Signed   By: Nelson Chimes M.D.   On: 02/12/2017 08:06   Ct Head Wo Contrast  Result Date: 02/12/2017 CLINICAL DATA:  Left facial trauma.  On aspirin and Plavix.  Fall. EXAM: CT HEAD WITHOUT CONTRAST CT MAXILLOFACIAL WITHOUT CONTRAST CT CERVICAL SPINE WITHOUT CONTRAST TECHNIQUE: Multidetector CT imaging of the head, cervical spine, and maxillofacial structures were performed using the standard protocol without intravenous contrast. Multiplanar CT image reconstructions of the cervical spine and maxillofacial structures were also generated. COMPARISON:  04/26/2007 FINDINGS: CT HEAD FINDINGS Brain: There is patchy low attenuation within the subcortical and periventricular white matter compatible with chronic small vessel ischemic disease. Prominence of the sulci and ventricles are identified compatible with brain atrophy. No evidence for acute brain infarct, intracranial hemorrhage or mass. Vascular: No hyperdense vessel or unexpected calcification. Skull: Normal. Negative for fracture or focal lesion. Other: None CT MAXILLOFACIAL FINDINGS Osseous: No fracture or mandibular dislocation. No destructive process. Orbits: Large left-sided periorbital hematoma is identified, image number 27 of series 12. Sinuses: Chronic mucoperiosteal thickening involves the right maxillary sinus. Soft tissues: There is extensive subcutaneous gas identified within the soft tissues of the left side of face. Large left facial hematoma is identified measuring 4.8 by 2.6 by 6.5 cm. CT CERVICAL SPINE FINDINGS Alignment: Normal. Skull base and vertebrae: No acute fracture. No primary bone lesion or focal pathologic process. Soft tissues and spinal canal: No prevertebral fluid or swelling. No visible  canal hematoma. Disc levels: Mild multi level disc space narrowing and ventral endplate spurring is noted extending from C2-3 through C6-7. Upper chest: Negative. Other: None IMPRESSION: 1. No acute intracranial abnormality. Small vessel ischemic disease and brain atrophy noted. 2. No evidence for cervical spine fracture. 3. Large left periorbital and left facial hematomas with extensive subcutaneous emphysema. 4. Chronic right maxillary sinus disease 5. Chronic multi level degenerative disc disease Electronically Signed   By: Kerby Moors M.D.   On: 02/12/2017 08:25   Ct Cervical Spine Wo Contrast  Result Date: 02/12/2017 CLINICAL DATA:  Left facial trauma.  On aspirin and Plavix.  Fall. EXAM: CT HEAD WITHOUT CONTRAST CT MAXILLOFACIAL WITHOUT CONTRAST CT CERVICAL SPINE WITHOUT CONTRAST TECHNIQUE: Multidetector CT imaging of the head, cervical spine, and maxillofacial structures were performed using the standard protocol without intravenous contrast. Multiplanar CT image reconstructions of the cervical spine and maxillofacial structures were also generated. COMPARISON:  04/26/2007 FINDINGS: CT HEAD FINDINGS Brain: There is patchy low attenuation within the subcortical and periventricular white matter compatible with chronic small vessel ischemic disease. Prominence of the sulci and ventricles are identified compatible with brain atrophy. No evidence for acute brain infarct, intracranial hemorrhage or mass. Vascular: No hyperdense vessel or unexpected calcification. Skull: Normal. Negative for fracture or focal lesion. Other: None CT MAXILLOFACIAL FINDINGS Osseous: No fracture or mandibular dislocation. No destructive process. Orbits: Large left-sided periorbital hematoma is identified, image number 27 of series 12. Sinuses: Chronic mucoperiosteal thickening involves the right maxillary sinus. Soft tissues: There is extensive subcutaneous gas identified within the soft tissues of the left side of face. Large  left facial hematoma is identified measuring 4.8 by 2.6 by 6.5 cm. CT  CERVICAL SPINE FINDINGS Alignment: Normal. Skull base and vertebrae: No acute fracture. No primary bone lesion or focal pathologic process. Soft tissues and spinal canal: No prevertebral fluid or swelling. No visible canal hematoma. Disc levels: Mild multi level disc space narrowing and ventral endplate spurring is noted extending from C2-3 through C6-7. Upper chest: Negative. Other: None IMPRESSION: 1. No acute intracranial abnormality. Small vessel ischemic disease and brain atrophy noted. 2. No evidence for cervical spine fracture. 3. Large left periorbital and left facial hematomas with extensive subcutaneous emphysema. 4. Chronic right maxillary sinus disease 5. Chronic multi level degenerative disc disease Electronically Signed   By: Kerby Moors M.D.   On: 02/12/2017 08:25   Ct Maxillofacial Wo Cm  Result Date: 02/12/2017 CLINICAL DATA:  Left facial trauma.  On aspirin and Plavix.  Fall. EXAM: CT HEAD WITHOUT CONTRAST CT MAXILLOFACIAL WITHOUT CONTRAST CT CERVICAL SPINE WITHOUT CONTRAST TECHNIQUE: Multidetector CT imaging of the head, cervical spine, and maxillofacial structures were performed using the standard protocol without intravenous contrast. Multiplanar CT image reconstructions of the cervical spine and maxillofacial structures were also generated. COMPARISON:  04/26/2007 FINDINGS: CT HEAD FINDINGS Brain: There is patchy low attenuation within the subcortical and periventricular white matter compatible with chronic small vessel ischemic disease. Prominence of the sulci and ventricles are identified compatible with brain atrophy. No evidence for acute brain infarct, intracranial hemorrhage or mass. Vascular: No hyperdense vessel or unexpected calcification. Skull: Normal. Negative for fracture or focal lesion. Other: None CT MAXILLOFACIAL FINDINGS Osseous: No fracture or mandibular dislocation. No destructive process. Orbits:  Large left-sided periorbital hematoma is identified, image number 27 of series 12. Sinuses: Chronic mucoperiosteal thickening involves the right maxillary sinus. Soft tissues: There is extensive subcutaneous gas identified within the soft tissues of the left side of face. Large left facial hematoma is identified measuring 4.8 by 2.6 by 6.5 cm. CT CERVICAL SPINE FINDINGS Alignment: Normal. Skull base and vertebrae: No acute fracture. No primary bone lesion or focal pathologic process. Soft tissues and spinal canal: No prevertebral fluid or swelling. No visible canal hematoma. Disc levels: Mild multi level disc space narrowing and ventral endplate spurring is noted extending from C2-3 through C6-7. Upper chest: Negative. Other: None IMPRESSION: 1. No acute intracranial abnormality. Small vessel ischemic disease and brain atrophy noted. 2. No evidence for cervical spine fracture. 3. Large left periorbital and left facial hematomas with extensive subcutaneous emphysema. 4. Chronic right maxillary sinus disease 5. Chronic multi level degenerative disc disease Electronically Signed   By: Kerby Moors M.D.   On: 02/12/2017 08:25    ASSESSMENT AND PLAN:   Active Problems:   Fall  83 year old female with a history of CAD/PCI and essential hypertension who presents after a fall and a suffered large left periorbital and facial hematoma with extensive subcutaneous emphysema  1. Intractable pain due to Extensive left periorbital/facial hematoma with subcutaneous emphysema surprisingly no fractures noted on CT scan   IV pain medications as well as oral pain medications. PT consult for Falls  Seen by ENT- drained hematoma, suggest 2 days ice packs and follow in ENT clinic in 1 week for suture removal.  Also check tele and orthostatic vitals.  2. CAD/PCI: Due to large hematomas, hold aspirin and Plavix for now. Continue atorvastatin and metoprolol may resume ASA on d/c, stent was many years ago- so may avoid  plavix.  3. Essential hypertension: Continue metoprolol and losartan  4. Hyperglycemia: Repeat BMP in a.m- normal Glucose.  5. UTI  IV rocephin and Ur cx.   All the records are reviewed and case discussed with Care Management/Social Workerr. Management plans discussed with the patient, family and they are in agreement.  CODE STATUS: DNR  TOTAL TIME TAKING CARE OF THIS PATIENT: 35 minutes.    POSSIBLE D/C IN 1-2 DAYS, DEPENDING ON CLINICAL CONDITION.   Vaughan Basta M.D on 02/13/2017   Between 7am to 6pm - Pager - [phone]  After 6pm go to [email or web address] - password [email or web address] [facility]  Office  [phone]  CC: Primary care physician; Idelle Crouch, MD  Note: This dictation was prepared with Dragon dictation along with smaller phrase technology. Any transcriptional errors that result from this process are unintentional.

## 2017-02-13 NOTE — Evaluation (Signed)
Physical Therapy Evaluation Patient Details Name: Rachael Humphrey MRN: [id number] DOB: 22-Jun-1930 Today's Date: 02/13/2017   History of Present Illness  presented to ER status post mechanical fall with L facial trauma; admitted for management of intractible pain due to L periorbital/facial hematoma.  Status post incision and drainage of L facial hematoma with closure of L facial laceration.  Clinical Impression  Upon evaluation, patient alert and oriented; follows all commands and demonstrates good safety awareness/insight. Bilat UE/LE strength and ROM grossly symmetrical and WFL; no apparent weakness, sensory or coordination deficit appreciated.  Able to complete sit/stand, basic transfers and gait (400') without assist device, mod indep.  Good stability and confidence of movement, able to complete DGI (12/12) without LOB or safety concern. Mild higher level balance deficits in periods of SLS (5-6 seconds each LE); however, patient feels this is baseline for her and unchanged with this admission.  Verbalizes understanding of compensatory techniques and safety needs related to higher level balance activities. No acute PT needs identified at this time, as patient appears at baseline level of functional ability.  Will complete initial order; please re-consult should needs change.    Follow Up Recommendations No PT follow up    Equipment Recommendations       Recommendations for Other Services       Precautions / Restrictions Precautions Precautions: Fall Restrictions Weight Bearing Restrictions: No      Mobility  Bed Mobility Overal bed mobility: Modified Independent                Transfers Overall transfer level: Modified independent                  Ambulation/Gait Ambulation/Gait assistance: Supervision;Modified independent (Device/Increase time) Ambulation Distance (Feet): 400 Feet Assistive device: None   Gait velocity: 10' walk time, 5 seconds   General Gait  Details: reciprocal stepping pattern with good cadence/gait speed; good mechanics and overall gait confidence.  Completes dynamic gait components without difficulty, LOB or safety concern.  Stairs            Wheelchair Mobility    Modified Rankin (Stroke Patients Only)       Balance Overall balance assessment: Needs assistance Sitting-balance support: No upper extremity supported Sitting balance-Leahy Scale: Normal     Standing balance support: No upper extremity supported Standing balance-Leahy Scale: Good   Single Leg Stance - Right Leg: 6 Single Leg Stance - Left Leg: 5                         Pertinent Vitals/Pain Pain Assessment: Faces Faces Pain Scale: Hurts a little bit Pain Location: L face/jaw Pain Descriptors / Indicators: Aching;Sore Pain Intervention(s): Limited activity within patient's tolerance;Monitored during session;Repositioned    Home Living Family/patient expects to be discharged to:: Private residence Living Arrangements: Spouse/significant other Available Help at Discharge: Family Type of Home: House Home Access: Stairs to enter Entrance Stairs-Rails:  (post to hold) Technical brewer of Steps: 1 Home Layout: One level Home Equipment: None      Prior Function Level of Independence: Independent         Comments: Indep with ADLs, household and community mobility; active outdoors, enjoys gardening.  Does endorse 2 falls in previous six months     Hand Dominance        Extremity/Trunk Assessment   Upper Extremity Assessment Upper Extremity Assessment: Overall WFL for tasks assessed    Lower Extremity Assessment Lower Extremity Assessment:  Overall WFL for tasks assessed       Communication   Communication: No difficulties  Cognition Arousal/Alertness: Awake/alert Behavior During Therapy: WFL for tasks assessed/performed Overall Cognitive Status: Within Functional Limits for tasks assessed                                         General Comments      Exercises Other Exercises Other Exercises: Reviewed safety recommendations for higher level balance activities; value of activity pacing and energy conservation (especially when working outdoors, on uneven surfaces).  Patient voiced understanding/agreement.   Assessment/Plan    PT Assessment Patent does not need any further PT services  PT Problem List         PT Treatment Interventions      PT Goals (Current goals can be found in the Care Plan section)  Acute Rehab PT Goals Patient Stated Goal: to go home PT Goal Formulation: All assessment and education complete, DC therapy    Frequency     Barriers to discharge        Co-evaluation               AM-PAC PT "6 Clicks" Daily Activity  Outcome Measure Difficulty turning over in bed (including adjusting bedclothes, sheets and blankets)?: None Difficulty moving from lying on back to sitting on the side of the bed? : None Difficulty sitting down on and standing up from a chair with arms (e.g., wheelchair, bedside commode, etc,.)?: None Help needed moving to and from a bed to chair (including a wheelchair)?: None Help needed walking in hospital room?: None Help needed climbing 3-5 steps with a railing? : A Little 6 Click Score: 23    End of Session Equipment Utilized During Treatment: Gait belt Activity Tolerance: Patient tolerated treatment well Patient left: in bed;with call bell/phone within reach;with bed alarm set;with family/visitor present   PT Visit Diagnosis: History of falling (Z91.81)    Time: 3491-7915 PT Time Calculation (min) (ACUTE ONLY): 20 min   Charges:   PT Evaluation $PT Eval Low Complexity: 1 Procedure PT Treatments $Therapeutic Activity: 8-22 mins   PT G Codes:   PT G-Codes **NOT FOR INPATIENT CLASS** Functional Assessment Tool Used: AM-PAC 6 Clicks Basic Mobility Functional Limitation: Mobility: Walking and moving  around Mobility: Walking and Moving Around Current Status (A5697): At least 1 percent but less than 20 percent impaired, limited or restricted Mobility: Walking and Moving Around Goal Status [phone]): At least 1 percent but less than 20 percent impaired, limited or restricted Mobility: Walking and Moving Around Discharge Status [phone]): At least 1 percent but less than 20 percent impaired, limited or restricted    H. Owens Shark, PT, DPT, NCS 02/13/17, 3:20 PM [phone]

## 2017-02-14 MED ORDER — DOCUSATE SODIUM 100 MG PO CAPS
100.0000 mg | ORAL_CAPSULE | Freq: Two times a day (BID) | ORAL | Status: DC
  Administered 2017-02-14: 100 mg via ORAL
  Filled 2017-02-14: qty 1

## 2017-02-14 MED ORDER — MAGIC MOUTHWASH: 5.0000 mL | Freq: Three times a day (TID) | ORAL | 0 refills | Status: DC | PRN

## 2017-02-14 MED ORDER — CIPROFLOXACIN HCL 500 MG PO TABS: 500.0000 mg | ORAL_TABLET | Freq: Two times a day (BID) | ORAL | 0 refills | Status: AC

## 2017-02-14 MED ORDER — TRAMADOL HCL 50 MG PO TABS: 50.0000 mg | ORAL_TABLET | Freq: Four times a day (QID) | ORAL | 0 refills | Status: DC | PRN

## 2017-02-14 NOTE — Progress Notes (Signed)
Patient discharge instructions and prescriptions given to patient, verbalized understanding. Family providing transportation home.

## 2017-02-14 NOTE — Progress Notes (Signed)
Pt. Request colace. Dr. Marcille Blanco ordered colace 100mg  PO BID

## 2017-02-14 NOTE — Discharge Instructions (Signed)
Resume diet and activity as before.  You can restart Aspirin February 18, 2017.  Can restart Plavix February 26, 2017 if your bruising is improving. Please stop if any worsening.

## 2017-02-14 NOTE — Progress Notes (Signed)
Patient resting no bed. Family at bedside. Patient and family asking if doctor will be coming soon, patient is eager to go home. Waiting on MD to round, will continue to monitor.

## 2017-02-15 LAB — URINE CULTURE

## 2017-02-16 NOTE — Discharge Summary (Signed)
[facility] [place] NAME: Rachael Humphrey    MR#:  [id number]  DATE OF BIRTH:  Feb 21, 1930  DATE OF ADMISSION:  02/12/2017 ADMITTING PHYSICIAN: Bettey Costa, MD  DATE OF DISCHARGE: 02/14/2017  2:00 PM  PRIMARY CARE PHYSICIAN: Idelle Crouch, MD   ADMISSION DIAGNOSIS:  Contusion of face, initial encounter [S00.83XA] Injury of head, initial encounter [S09.90XA] Fall, initial encounter [W19.XXXA] Laceration of floor of mouth, initial encounter [W09.811B]  DISCHARGE DIAGNOSIS:  Active Problems:   Fall   SECONDARY DIAGNOSIS:   Past Medical History:  Diagnosis Date  . Cancer [facility]) 2005   colon cancer  . Complication of anesthesia   . GERD (gastroesophageal reflux disease)   . H/O hiatal hernia   . Hemorrhoids    internal-not a problem now.  . History of kidney stones    multiple  . History of shingles    04-18-14 3 weeks now -pain only left posterior back , no outbreaks  . Hypertension   . Myocardial infarction ([place])    2 months ago, stent placed [facility]  . PONV (postoperative nausea and vomiting)    no recent problems     ADMITTING HISTORY  HISTORY OF PRESENT ILLNESS:  Rachael Humphrey  is a 83 y.o. female with a known history of Essential hypertension and CAD/PCI who presents after a fall. Patient reports she was on her way into her house getting the paper when she stumbled on her feet and landed her left face right on a bunch of rocks. She presents to the ER with large hematoma on the left face and swelling. CT scan reported below. She is on aspirin and Plavix. She also fell last week when she was planting flowers on her wheelbarrow. Prior to any these falls she denies chest pain, shortness of breath, dyspnea or neurological deficits. She denies loss of consciousness. Hospitalist was consulted due to intractable pain. She has received several doses of IV pain medications. I have asked the ER physician to call ENT for their expert opinion  regarding CT scan and facial trauma.   HOSPITAL COURSE:   83 year old female with a history of CAD/PCI and essential hypertension who presents after a fall and a suffered large left periorbital and facial hematoma with extensive subcutaneous emphysema  1.  Extensive left periorbital/facial hematoma with subcutaneous emphysema surprisingly no fractures noted on CT scan IV pain medications used to start. Pain well-controlled by day of discharge and she will be given oral pain medications.  Seen by ENT- drained hematoma, suggest 2 days ice packs and follow in ENT clinic in 1 week for suture removal.  2. CAD/PCI: Due to large hematomas, hold aspirin and Plavix for now. Continue atorvastatin and metoprolol -Resume aspirin one week and Plavix after 2 weeks.  3. Essential hypertension: Continue metoprolol and losartan   4. UTI Started on IV Rocephin in the hospital. Discharge home on ciprofloxacin   Stable for discharge home  CONSULTS OBTAINED:    DRUG ALLERGIES:   Allergies  Allergen Reactions  . Codeine Nausea And Vomiting  . Darvon [Propoxyphene] Other (See Comments)    Altered mental status  altered mental status Altered mental status   . Demerol [Meperidine] Other (See Comments)    Altered mental status  altered mental status Altered mental status   . Gabapentin Other (See Comments)  . Sulfa Antibiotics Other (See Comments)  . Tramadol Other (See Comments)    "makes me crazy"  "makes me crazy"   .  Voltaren [Diclofenac Sodium] Other (See Comments)    "Makes me crazy" "Makes me crazy"  . Zinc     Hurting in lower stomach     DISCHARGE MEDICATIONS:   Discharge Medication List as of 02/14/2017 12:12 PM    START taking these medications   Details  ciprofloxacin (CIPRO) 500 MG tablet Take 1 tablet (500 mg total) by mouth 2 (two) times daily., Starting Sat 02/14/2017, Until Tue 02/24/2017, Normal    magic mouthwash SOLN Take 5 mLs by mouth 3 (three) times daily as  needed for mouth pain., Starting Sat 02/14/2017, Normal    traMADol (ULTRAM) 50 MG tablet Take 1 tablet (50 mg total) by mouth every 6 (six) hours as needed for moderate pain., Starting Sat 02/14/2017, Print      CONTINUE these medications which have NOT CHANGED   Details  aspirin 325 MG tablet Take 325 mg by mouth daily., Until Discontinued, Historical Med    atorvastatin (LIPITOR) 20 MG tablet Take 20 mg by mouth daily. Reported on 09/18/2015, Until Discontinued, Historical Med    Calcium Carb-Cholecalciferol (CALCIUM 600 + D PO) Take 1 tablet by mouth daily., Until Discontinued, Historical Med    cetirizine (ZYRTEC) 10 MG tablet Take 10 mg by mouth daily., Starting Fri 07/18/2016, Historical Med    clopidogrel (PLAVIX) 75 MG tablet TAKE 1 TABLET BY MOUTH EVERY DAY, Historical Med    levothyroxine (SYNTHROID, LEVOTHROID) 25 MCG tablet TAKE 1 TABLET ONCE DAILY ON EMPTY STOMACH WITH GLASS OF WATER 30-60 MIN BEFORE BREAKFAST, Historical Med    losartan (COZAAR) 50 MG tablet TAKE 1 TABLET BY MOUTH EVERY DAY, Historical Med    metoprolol succinate (TOPROL-XL) 25 MG 24 hr tablet Take 25 mg by mouth every morning., Until Discontinued, Historical Med    montelukast (SINGULAIR) 10 MG tablet TAKE 1 TABLET (10 MG TOTAL) BY MOUTH NIGHTLY., Historical Med    Multiple Vitamin (MULTIVITAMIN WITH MINERALS) TABS tablet Take 1 tablet by mouth daily., Until Discontinued, Historical Med    Multiple Vitamins-Minerals (PRESERVISION AREDS 2 PO) Take by mouth., Historical Med    pantoprazole (PROTONIX) 40 MG tablet TAKE 1 TABLET BY MOUTH ONCE A DAY 1 HOUR BEFORE A MEAL, Historical Med    acetaminophen (TYLENOL) 500 MG tablet Take by mouth., Historical Med    docusate sodium (COLACE) 100 MG capsule TAKE 2 CAPSULES BY MOUTH TWICE A DAY AT 8AM AND AT 5PM, Historical Med    fluticasone (FLONASE) 50 MCG/ACT nasal spray PLACE 1 SPRAY INTO BOTH NOSTRILS NIGHTLY AS NEEDED., Historical Med    meloxicam (MOBIC) 7.5 MG  tablet TAKE 1 TABLET (7.5 MG TOTAL) BY MOUTH DAILY., Starting Mon 12/01/2016, Normal    naphazoline-pheniramine (EYE ALLERGY RELIEF) 0.025-0.3 % ophthalmic solution Place 1 drop into both eyes 4 (four) times daily as needed for irritation., Until Discontinued, Historical Med      STOP taking these medications     NIFEdipine (PROCARDIA-XL/ADALAT-CC/NIFEDICAL-XL) 30 MG 24 hr tablet         Today   VITAL SIGNS:  Blood pressure (!) 156/87, pulse 72, temperature 98.6 F (37 C), temperature source Oral, resp. rate 18, height 5\' 1"  (1.549 m), weight 61.2 kg (135 lb), SpO2 96 %.  I/O:  No intake or output data in the 24 hours ending 02/16/17 1324  PHYSICAL EXAMINATION:  Physical Exam  GENERAL:  83 y.o.-year-old patient lying in the bed with no acute distress.  LUNGS: Normal breath sounds bilaterally, no wheezing, rales,rhonchi or crepitation. No use  of accessory muscles of respiration.  CARDIOVASCULAR: S1, S2 normal. No murmurs, rubs, or gallops.  ABDOMEN: Soft, non-tender, non-distended. Bowel sounds present. No organomegaly or mass.  NEUROLOGIC: Moves all 4 extremities. PSYCHIATRIC: The patient is alert and oriented x 3.  Bruising bilaterally on the face with periorbital hematoma. Has left subconjunctival hemorrhage.  DATA REVIEW:   CBC  Recent Labs Lab 02/12/17 0804  WBC 9.1  HGB 15.3  HCT 44.5  PLT 157    Chemistries   Recent Labs Lab 02/12/17 0804 02/13/17 0745  NA 141 137  K 4.0 4.3  CL 107 105  CO2 22 24  GLUCOSE 171* 109*  BUN 11 19  CREATININE 0.67 0.71  CALCIUM 9.5 8.5*  AST 35  --   ALT 27  --   ALKPHOS 80  --   BILITOT 0.9  --     Cardiac Enzymes  Recent Labs Lab 02/12/17 0804  TROPONINI <0.03    Microbiology Results  Results for orders placed or performed during the hospital encounter of 02/12/17  Urine culture     Status: Abnormal   Collection Time: 02/13/17 10:45 AM  Result Value Ref Range Status   Specimen Description URINE, RANDOM   Final   Special Requests NONE  Final   Culture MULTIPLE SPECIES PRESENT, SUGGEST RECOLLECTION (A)  Final   Report Status 02/15/2017 FINAL  Final    RADIOLOGY:  No results found.  Follow up with PCP in 1 week.  Management plans discussed with the patient, family and they are in agreement.  CODE STATUS:  Code Status History    Date Active Date Inactive Code Status Order ID Comments User Context   02/12/2017 12:28 PM 02/14/2017  5:05 PM DNR [id number]  Bettey Costa, MD Inpatient    Questions for Most Recent Historical Code Status (Order [id number])    Question Answer Comment   In the event of cardiac or respiratory ARREST Do not call a "code blue"    In the event of cardiac or respiratory ARREST Do not perform Intubation, CPR, defibrillation or ACLS    In the event of cardiac or respiratory ARREST Use medication by any route, position, wound care, and other measures to relive pain and suffering. May use oxygen, suction and manual treatment of airway obstruction as needed for comfort.         Advance Directive Documentation     Most Recent Value  Type of Advance Directive  Healthcare Power of Attorney, Living will  Pre-existing out of facility DNR order (yellow form or pink MOST form)  -  "MOST" Form in Place?  -      TOTAL TIME TAKING CARE OF THIS PATIENT ON DAY OF DISCHARGE: more than 30 minutes.   Hillary Bow R M.D on 02/16/2017 at 1:24 PM  Between 7am to 6pm - Pager - [phone]  After 6pm go to [email or web address] - password [email or web address]  Office  [phone]  CC: Primary care physician; Idelle Crouch, MD  Note: This dictation was prepared with Dragon dictation along with smaller phrase technology. Any transcriptional errors that result from this process are unintentional.

## 2017-02-19 ENCOUNTER — Emergency Department
Admission: EM | Admit: 2017-02-19 | Discharge: 2017-02-20 | Disposition: A | Discharge status: Home / Self Care | Payer: Medicare Other | Attending: Emergency Medicine | Admitting: Emergency Medicine

## 2017-02-19 ENCOUNTER — Encounter: Payer: Self-pay | Admitting: Emergency Medicine

## 2017-02-19 DIAGNOSIS — Z8673 Personal history of transient ischemic attack (TIA), and cerebral infarction without residual deficits: Secondary | ICD-10-CM | POA: Insufficient documentation

## 2017-02-19 DIAGNOSIS — Z9181 History of falling: Secondary | ICD-10-CM | POA: Insufficient documentation

## 2017-02-19 DIAGNOSIS — I2510 Atherosclerotic heart disease of native coronary artery without angina pectoris: Secondary | ICD-10-CM | POA: Insufficient documentation

## 2017-02-19 DIAGNOSIS — E119 Type 2 diabetes mellitus without complications: Secondary | ICD-10-CM | POA: Insufficient documentation

## 2017-02-19 DIAGNOSIS — I252 Old myocardial infarction: Secondary | ICD-10-CM | POA: Insufficient documentation

## 2017-02-19 DIAGNOSIS — I10 Essential (primary) hypertension: Secondary | ICD-10-CM | POA: Insufficient documentation

## 2017-02-19 DIAGNOSIS — G252 Other specified forms of tremor: Secondary | ICD-10-CM | POA: Diagnosis present

## 2017-02-19 DIAGNOSIS — R251 Tremor, unspecified: Secondary | ICD-10-CM | POA: Insufficient documentation

## 2017-02-19 DIAGNOSIS — Z955 Presence of coronary angioplasty implant and graft: Secondary | ICD-10-CM | POA: Insufficient documentation

## 2017-02-19 DIAGNOSIS — Z7902 Long term (current) use of antithrombotics/antiplatelets: Secondary | ICD-10-CM | POA: Insufficient documentation

## 2017-02-19 NOTE — ED Triage Notes (Signed)
Pt presents to ED 05 via EMS from home with complaint of uncontrolled shaking; pt states shaking lasted about half an hour to 45 mins, pt states it came on all of a sudden, pt states drinking water and that seems to have gotten rid of the shaking; pt is not shaking at this time; pt is awake, alert and oriented x4; pt's VS are stable with elevated HR; pt states pain at 7/10 when she moves, with the pain located on "one spot" on the right side of the rib cage.

## 2017-02-20 ENCOUNTER — Emergency Department: Payer: Medicare Other

## 2017-02-20 ENCOUNTER — Encounter: Payer: Self-pay | Admitting: Radiology

## 2017-02-20 LAB — CBC
HCT: 34.7 % — ABNORMAL LOW (ref 35.0–47.0)
Hemoglobin: 12.1 g/dL (ref 12.0–16.0)
MCH: 32.4 pg (ref 26.0–34.0)
MCHC: 35 g/dL (ref 32.0–36.0)
MCV: 92.6 fL (ref 80.0–100.0)
PLATELETS: 187 10*3/uL (ref 150–440)
RBC: 3.74 MIL/uL — ABNORMAL LOW (ref 3.80–5.20)
RDW: 13.7 % (ref 11.5–14.5)
WBC: 10.8 10*3/uL (ref 3.6–11.0)

## 2017-02-20 LAB — COMPREHENSIVE METABOLIC PANEL
ALT: 26 U/L (ref 14–54)
ANION GAP: 12 (ref 5–15)
AST: 31 U/L (ref 15–41)
Albumin: 4 g/dL (ref 3.5–5.0)
Alkaline Phosphatase: 72 U/L (ref 38–126)
BUN: 22 mg/dL — ABNORMAL HIGH (ref 6–20)
CHLORIDE: 107 mmol/L (ref 101–111)
CO2: 22 mmol/L (ref 22–32)
Calcium: 9 mg/dL (ref 8.9–10.3)
Creatinine, Ser: 0.95 mg/dL (ref 0.44–1.00)
GFR, EST NON AFRICAN AMERICAN: 53 mL/min — AB (ref >60)
Glucose, Bld: 145 mg/dL — ABNORMAL HIGH (ref 65–99)
Potassium: 3.7 mmol/L (ref 3.5–5.1)
SODIUM: 141 mmol/L (ref 135–145)
Total Bilirubin: 1 mg/dL (ref 0.3–1.2)
Total Protein: 6.4 g/dL — ABNORMAL LOW (ref 6.5–8.1)

## 2017-02-20 LAB — TROPONIN I

## 2017-02-20 MED ORDER — IOPAMIDOL (ISOVUE-370) INJECTION 76%
75.0000 mL | Freq: Once | INTRAVENOUS | Status: AC | PRN
  Administered 2017-02-20: 75 mL via INTRAVENOUS

## 2017-02-20 NOTE — ED Provider Notes (Signed)
[facility] Emergency Department Provider Note   First MD Initiated Contact with Patient 02/19/17 2321     (approximate)  I have reviewed the triage vital signs and the nursing notes.   HISTORY  Chief Complaint Shaking    HPI Rachael Humphrey is a 83 y.o. female with below list of chronic medical conditions including recent accidental fall with considerable facial and chest trauma including hematomas presents to the emergency department with bilateral upper lower extremities tremulousness which lasted approximately 30 minutes followed by spontaneous resolution while at home tonight. Patient stated that the symptoms "started all of a sudden" and resolved following drinking water.. Patient denies any headache at present no change in vision no nausea vomiting. Patient denies any gait instability.   Past Medical History:  Diagnosis Date  . Cancer [facility] [address]) 2005   colon cancer  . Complication of anesthesia   . GERD (gastroesophageal reflux disease)   . H/O hiatal hernia   . Hemorrhoids    internal-not a problem now.  . History of kidney stones    multiple  . History of shingles    04-18-14 3 weeks now -pain only left posterior back , no outbreaks  . Hypertension   . Myocardial infarction ([place])    2 months ago, stent placed [facility]  . PONV (postoperative nausea and vomiting)    no recent problems    Patient Active Problem List   Diagnosis Date Noted  . Fall 02/12/2017  . Acquired hypothyroidism 10/06/2016  . Arm fracture 10/06/2016  . Benign neoplasm of colon 10/06/2016  . Cervical adenopathy 10/06/2016  . Benign essential hypertension 10/06/2016  . GERD (gastroesophageal reflux disease) 10/06/2016  . Hiatal hernia 10/06/2016  . History of abnormal cervical Pap smear 10/06/2016  . History of TIA (transient ischemic attack) 10/06/2016  . Hyperlipidemia, unspecified 10/06/2016  . Nephrolithiasis 10/06/2016  . Senile osteoporosis 10/06/2016  . Type 2  diabetes mellitus without complication ([place]) 29/56/2130  . Rupture of implant of right breast 09/18/2015  . Chronic back pain 03/08/2014  . Compression fracture 03/08/2014  . Coronary artery disease 03/03/2014  . Wedge compression fracture of T7 vertebra ([place]) 02/22/2014    Past Surgical History:  Procedure Laterality Date  . APPENDECTOMY    . AUGMENTATION MAMMAPLASTY Bilateral 1978  . BREAST BIOPSY Right    over 30 years ago negative  . BREAST BIOPSY Left    negative over 30 years ago  . CATARACT EXTRACTION, BILATERAL    . COLECTOMY     partial small and large instestine removed  . CORONARY ANGIOPLASTY WITH STENT PLACEMENT     02-23-14 coranary stent placed.  . CYSTOSCOPY WITH RETROGRADE PYELOGRAM, URETEROSCOPY AND STENT PLACEMENT Bilateral 05/17/2014   Procedure: CYSTOSCOPY WITH RETROGRADE PYELOGRAM, URETEROSCOPY AND STENT PLACEMENT, BASKET STONE REMOVAL;  Surgeon: Alexis Frock, MD;  Location: [facility];  Service: Urology;  Laterality: Bilateral;    Prior to Admission medications   Medication Sig Start Date End Date Taking? Authorizing Provider  acetaminophen (TYLENOL) 500 MG tablet Take by mouth.    [provider]  aspirin 325 MG tablet Take 325 mg by mouth daily.    [provider]  atorvastatin (LIPITOR) 20 MG tablet Take 20 mg by mouth daily. Reported on 09/18/2015    [provider]  Calcium Carb-Cholecalciferol (CALCIUM 600 + D PO) Take 1 tablet by mouth daily.    [provider]  cetirizine (ZYRTEC) 10 MG tablet Take 10 mg by mouth daily. 07/18/16  [provider]  ciprofloxacin (CIPRO) 500 MG tablet Take 1 tablet (500 mg total) by mouth 2 (two) times daily. 02/14/17 02/24/17  Hillary Bow, MD  clopidogrel (PLAVIX) 75 MG tablet TAKE 1 TABLET BY MOUTH EVERY DAY 11/12/15   [provider]  docusate sodium (COLACE) 100 MG capsule TAKE 2 CAPSULES BY MOUTH TWICE A DAY AT 8AM AND AT 5PM 10/02/14   [provider]    fluticasone (FLONASE) 50 MCG/ACT nasal spray PLACE 1 SPRAY INTO BOTH NOSTRILS NIGHTLY AS NEEDED. 11/12/15   [provider]  levothyroxine (SYNTHROID, LEVOTHROID) 25 MCG tablet TAKE 1 TABLET ONCE DAILY ON EMPTY STOMACH WITH GLASS OF WATER 30-60 MIN BEFORE BREAKFAST 12/10/16   [provider]  losartan (COZAAR) 50 MG tablet TAKE 1 TABLET BY MOUTH EVERY DAY 08/13/16   [provider]  magic mouthwash SOLN Take 5 mLs by mouth 3 (three) times daily as needed for mouth pain. 02/14/17   Hillary Bow, MD  meloxicam (MOBIC) 7.5 MG tablet TAKE 1 TABLET (7.5 MG TOTAL) BY MOUTH DAILY. Patient not taking: Reported on 02/12/2017 12/01/16   Gardiner Barefoot, DPM  metoprolol succinate (TOPROL-XL) 25 MG 24 hr tablet Take 25 mg by mouth every morning.    [provider]  montelukast (SINGULAIR) 10 MG tablet TAKE 1 TABLET (10 MG TOTAL) BY MOUTH NIGHTLY. 07/18/16   [provider]  Multiple Vitamin (MULTIVITAMIN WITH MINERALS) TABS tablet Take 1 tablet by mouth daily.    [provider]  Multiple Vitamins-Minerals (PRESERVISION AREDS 2 PO) Take by mouth.    [provider]  naphazoline-pheniramine (EYE ALLERGY RELIEF) 0.025-0.3 % ophthalmic solution Place 1 drop into both eyes 4 (four) times daily as needed for irritation.    [provider]  pantoprazole (PROTONIX) 40 MG tablet TAKE 1 TABLET BY MOUTH ONCE A DAY 1 HOUR BEFORE A MEAL 08/13/16   [provider]  traMADol (ULTRAM) 50 MG tablet Take 1 tablet (50 mg total) by mouth every 6 (six) hours as needed for moderate pain. 02/14/17   Hillary Bow, MD    Allergies Codeine; Darvon [propoxyphene]; Demerol [meperidine]; Gabapentin; Sulfa antibiotics; Tramadol; Voltaren [diclofenac sodium]; and Zinc  Family History  Problem Relation Age of Onset  . Stroke Mother   . Breast cancer Sister 74    Social History Social History  Substance Use Topics  . Smoking status: Never Smoker  .  Smokeless tobacco: Never Used  . Alcohol use No    Review of Systems Constitutional: No fever/chills Eyes: No visual changes. ENT: No sore throat. Cardiovascular: Denies chest pain. Respiratory: Denies shortness of breath. Gastrointestinal: No abdominal pain.  No nausea, no vomiting.  No diarrhea.  No constipation. Genitourinary: Negative for dysuria. Musculoskeletal: Negative for neck pain.  Negative for back pain. Integumentary: Negative for rash. Neurological: Negative for headaches, focal weakness or numbness.Positive for upper and lower extremity tremulousness   ____________________________________________   PHYSICAL EXAM:  VITAL SIGNS: ED Triage Vitals  Enc Vitals Group     BP 02/19/17 2259 (!) 146/75     Pulse Rate 02/19/17 2259 (!) 103     Resp 02/19/17 2259 18     Temp 02/19/17 2259 98.5 F (36.9 C)     Temp Source 02/19/17 2259 Oral     SpO2 02/19/17 2254 96 %     Weight 02/19/17 2259 62.9 kg (138 lb 9.6 oz)     Height 02/19/17 2259 1.549 m (5\' 1" )     Head Circumference --  Peak Flow --      Pain Score 02/19/17 2255 7     Pain Loc --      Pain Edu? --      Excl. in [place]? --     Constitutional: Alert and oriented. Well appearing and in no acute distress. Eyes: Conjunctivae are normal.  Head: Multiple healing areas of ecchymoses on the face with left cheek swelling (all improved since May 31st fall) Mouth/Throat: Mucous membranes are moist.  Oropharynx non-erythematous. Neck: No stridor.  Cardiovascular: Normal rate, regular rhythm. Good peripheral circulation. Grossly normal heart sounds. Respiratory: Normal respiratory effort.  No retractions. Lungs CTAB. Gastrointestinal: Soft and nontender. No distention.  Musculoskeletal: No lower extremity tenderness nor edema. No gross deformities of extremities. Neurologic:  Normal speech and language. No gross focal neurologic deficits are appreciated.  Skin:  Skin is warm, dry and intact. No rash  noted. Psychiatric: Mood and affect are normal. Speech and behavior are normal.  ____________________________________________   LABS (all labs ordered are listed, but only abnormal results are displayed)  Labs Reviewed  CBC - Abnormal; Notable for the following:       Result Value   RBC 3.74 (*)    HCT 34.7 (*)    All other components within normal limits  COMPREHENSIVE METABOLIC PANEL - Abnormal; Notable for the following:    Glucose, Bld 145 (*)    BUN 22 (*)    Total Protein 6.4 (*)    GFR calc non Af Amer 53 (*)    All other components within normal limits  TROPONIN I     RADIOLOGY I, Morris N , personally viewed and evaluated these images (plain radiographs) as part of my medical decision making, as well as reviewing the written report by the radiologist.  Ct Angio Head W Or Wo Contrast  Result Date: 02/20/2017 CLINICAL DATA:  Initial evaluation for occipital headache. EXAM: CT ANGIOGRAPHY HEAD AND NECK TECHNIQUE: Multidetector CT imaging of the head and neck was performed using the standard protocol during bolus administration of intravenous contrast. Multiplanar CT image reconstructions and MIPs were obtained to evaluate the vascular anatomy. Carotid stenosis measurements (when applicable) are obtained utilizing NASCET criteria, using the distal internal carotid diameter as the denominator. CONTRAST:  75 cc of Isovue 370. COMPARISON:  Prior CT from 02/12/2017. FINDINGS: CT HEAD FINDINGS Brain: Generalized age-related cerebral atrophy with moderate chronic small vessel ischemic disease. No acute intracranial hemorrhage. No evidence for acute large vessel territory infarct. No mass lesion, midline shift or mass effect. No hydrocephalus. No extra-axial fluid collection. Vascular: No hyperdense vessel. Scattered vascular calcifications noted within the carotid siphons. Skull: Scalp soft tissues demonstrate no acute abnormality. Calvarium intact. Sinuses: Chronic right maxillary  sinusitis noted. Paranasal sinuses are otherwise clear. No mastoid effusion. Orbits: Globes and orbital soft tissues within normal limits. Patient status post lens extraction bilaterally. CTA NECK FINDINGS Aortic arch: Ascending aorta up ectatic measuring up to 3.8 cm in diameter. Visualized aorta otherwise within normal limits. Mild scattered plaque within the visualized aorta and about the origin the great vessels without flow-limiting stenosis. Visualized subclavian artery is widely patent. Right carotid system: Right common carotid artery widely patent from its origin to the bifurcation. Atheromatous plaque about the right carotid bifurcation without flow-limiting stenosis. Right ICA patent distally to the skullbase without stenosis, dissection, or occlusion. Left carotid system: Left common carotid artery patent from its origin to the bifurcation. Atheromatous plaque about the left bifurcation without high-grade stenosis. Associated mild narrowing  of approximate 35% by NASCET criteria. Left ICA patent distally to the skullbase without stenosis, dissection, or occlusion. Vertebral arteries: Both of the vertebral arteries arise from the subclavian arteries. Left vertebral artery dominant. Focal plaque at the origin of the left vertebral artery with moderate stenosis. Vertebral arteries otherwise widely patent within the neck without stenosis. Evaluation mildly limited by streak artifact from dental amalgam. Skeleton: No acute osseus abnormality. No worrisome lytic or blastic osseous lesions. Other neck: Soft tissue swelling at the left facial/ pre maxillary soft tissues, suggestive of contusion/hematoma. Subcentimeter stone noted within the left submandibular gland. 9 mm hypodense left thyroid nodule noted. No adenopathy. Upper chest: Visualized upper chest demonstrates no acute abnormality. Atelectatic changes in within the visualized lungs. Review of the MIP images confirms the above findings CTA HEAD FINDINGS  Anterior circulation: Petrous segments widely patent bilaterally. Scattered atheromatous plaque within the carotid siphons without flow-limiting stenosis. ICA termini widely patent. Right A1 segment dominant and widely patent. Left A1 segment patent as well. Anterior communicating artery normal. Anterior cerebral arteries patent to their distal aspects. M1 segments patent without stenosis or occlusion. No proximal M2 occlusion. Distal MCA branches well opacified and symmetric. Posterior circulation: Vertebral arteries patent to the vertebrobasilar junction. Left vertebral artery dominant. Posterior inferior cerebral arteries patent bilaterally. Basilar artery widely patent. Superior cerebral arteries patent bilaterally. Right PCA arises from the basilar and is widely patent to its distal aspect. Fetal type left PCA supplied via the left posterior communicating artery. Left PCA also patent to its distal aspect. Venous sinuses: Grossly patent. Left transverse sinus hypoplastic and not well evaluated. Anatomic variants: No anatomic variant. No aneurysm or vascular malformation. Delayed phase: No pathologic enhancement. Review of the MIP images confirms the above findings IMPRESSION: 1. Negative CTA of the head and neck. No large or proximal arterial branch occlusion. No high-grade or correctable stenosis. 2. Mild atheromatous plaque about the carotid bifurcations bilaterally, left greater than right. Associated mild narrowing of approximately 35% on the left. No significant atheromatous narrowing within the right carotid artery system. 3. Moderate short-segment atheromatous stenosis at the origin of the left vertebral artery. Otherwise widely patent vertebrobasilar system. 4. Diffuse tortuosity of the major arterial vasculature with mildly dolichoectatic intracranial circulation, suggesting chronic underlying hypertension. 5. Ectatic ascending aorta measuring up to 3.8 cm in diameter. Recommend annual imaging followup  by CTA or MRA. This recommendation follows 2010 ACCF/AHA/AATS/ACR/ASA/SCA/SCAI/SIR/STS/SVM Guidelines for the Diagnosis and Management of Patients with Thoracic Aortic Disease. Circulation.2010; 121: B341-P379. 6. Soft tissue swelling at the left pre maxillary soft tissues, suggesting contusion/hematoma. Correlation physical exam recommended. Electronically Signed   By: Jeannine Boga M.D.   On: 02/20/2017 02:15   Ct Angio Neck W And/or Wo Contrast  Result Date: 02/20/2017 CLINICAL DATA:  Initial evaluation for occipital headache. EXAM: CT ANGIOGRAPHY HEAD AND NECK TECHNIQUE: Multidetector CT imaging of the head and neck was performed using the standard protocol during bolus administration of intravenous contrast. Multiplanar CT image reconstructions and MIPs were obtained to evaluate the vascular anatomy. Carotid stenosis measurements (when applicable) are obtained utilizing NASCET criteria, using the distal internal carotid diameter as the denominator. CONTRAST:  75 cc of Isovue 370. COMPARISON:  Prior CT from 02/12/2017. FINDINGS: CT HEAD FINDINGS Brain: Generalized age-related cerebral atrophy with moderate chronic small vessel ischemic disease. No acute intracranial hemorrhage. No evidence for acute large vessel territory infarct. No mass lesion, midline shift or mass effect. No hydrocephalus. No extra-axial fluid collection. Vascular: No hyperdense vessel. Scattered  vascular calcifications noted within the carotid siphons. Skull: Scalp soft tissues demonstrate no acute abnormality. Calvarium intact. Sinuses: Chronic right maxillary sinusitis noted. Paranasal sinuses are otherwise clear. No mastoid effusion. Orbits: Globes and orbital soft tissues within normal limits. Patient status post lens extraction bilaterally. CTA NECK FINDINGS Aortic arch: Ascending aorta up ectatic measuring up to 3.8 cm in diameter. Visualized aorta otherwise within normal limits. Mild scattered plaque within the visualized  aorta and about the origin the great vessels without flow-limiting stenosis. Visualized subclavian artery is widely patent. Right carotid system: Right common carotid artery widely patent from its origin to the bifurcation. Atheromatous plaque about the right carotid bifurcation without flow-limiting stenosis. Right ICA patent distally to the skullbase without stenosis, dissection, or occlusion. Left carotid system: Left common carotid artery patent from its origin to the bifurcation. Atheromatous plaque about the left bifurcation without high-grade stenosis. Associated mild narrowing of approximate 35% by NASCET criteria. Left ICA patent distally to the skullbase without stenosis, dissection, or occlusion. Vertebral arteries: Both of the vertebral arteries arise from the subclavian arteries. Left vertebral artery dominant. Focal plaque at the origin of the left vertebral artery with moderate stenosis. Vertebral arteries otherwise widely patent within the neck without stenosis. Evaluation mildly limited by streak artifact from dental amalgam. Skeleton: No acute osseus abnormality. No worrisome lytic or blastic osseous lesions. Other neck: Soft tissue swelling at the left facial/ pre maxillary soft tissues, suggestive of contusion/hematoma. Subcentimeter stone noted within the left submandibular gland. 9 mm hypodense left thyroid nodule noted. No adenopathy. Upper chest: Visualized upper chest demonstrates no acute abnormality. Atelectatic changes in within the visualized lungs. Review of the MIP images confirms the above findings CTA HEAD FINDINGS Anterior circulation: Petrous segments widely patent bilaterally. Scattered atheromatous plaque within the carotid siphons without flow-limiting stenosis. ICA termini widely patent. Right A1 segment dominant and widely patent. Left A1 segment patent as well. Anterior communicating artery normal. Anterior cerebral arteries patent to their distal aspects. M1 segments patent  without stenosis or occlusion. No proximal M2 occlusion. Distal MCA branches well opacified and symmetric. Posterior circulation: Vertebral arteries patent to the vertebrobasilar junction. Left vertebral artery dominant. Posterior inferior cerebral arteries patent bilaterally. Basilar artery widely patent. Superior cerebral arteries patent bilaterally. Right PCA arises from the basilar and is widely patent to its distal aspect. Fetal type left PCA supplied via the left posterior communicating artery. Left PCA also patent to its distal aspect. Venous sinuses: Grossly patent. Left transverse sinus hypoplastic and not well evaluated. Anatomic variants: No anatomic variant. No aneurysm or vascular malformation. Delayed phase: No pathologic enhancement. Review of the MIP images confirms the above findings IMPRESSION: 1. Negative CTA of the head and neck. No large or proximal arterial branch occlusion. No high-grade or correctable stenosis. 2. Mild atheromatous plaque about the carotid bifurcations bilaterally, left greater than right. Associated mild narrowing of approximately 35% on the left. No significant atheromatous narrowing within the right carotid artery system. 3. Moderate short-segment atheromatous stenosis at the origin of the left vertebral artery. Otherwise widely patent vertebrobasilar system. 4. Diffuse tortuosity of the major arterial vasculature with mildly dolichoectatic intracranial circulation, suggesting chronic underlying hypertension. 5. Ectatic ascending aorta measuring up to 3.8 cm in diameter. Recommend annual imaging followup by CTA or MRA. This recommendation follows 2010 ACCF/AHA/AATS/ACR/ASA/SCA/SCAI/SIR/STS/SVM Guidelines for the Diagnosis and Management of Patients with Thoracic Aortic Disease. Circulation.2010; 121: S341-D622. 6. Soft tissue swelling at the left pre maxillary soft tissues, suggesting contusion/hematoma. Correlation physical exam recommended.  Electronically Signed   By:  Jeannine Boga M.D.   On: 02/20/2017 02:15      Procedures   ____________________________________________   INITIAL IMPRESSION / ASSESSMENT AND PLAN / ED COURSE  Pertinent labs & imaging results that were available during my care of the patient were reviewed by me and considered in my medical decision making (see chart for details).  83 year old female presenting with a episode of bilateral upper and lower extremity tremulousness without loss of consciousness which occurred at home. Patient states ever since her accidental fall she has been quite tremulous. Patient had no episodes while in the emergency department CT and she will head and neck revealed no acute findings. Laboratory data unremarkable including sodium and calcium. Patient be referred to primary care provider Dr. Doy Hutching for further outpatient evaluation and management      ____________________________________________  FINAL CLINICAL IMPRESSION(S) / ED DIAGNOSES  Final diagnoses:  Tremulousness     MEDICATIONS GIVEN DURING THIS VISIT:  Medications  iopamidol (ISOVUE-370) 76 % injection 75 mL (75 mLs Intravenous Contrast Given 02/20/17 0044)     NEW OUTPATIENT MEDICATIONS STARTED DURING THIS VISIT:  New Prescriptions   No medications on file    Modified Medications   No medications on file    Discontinued Medications   No medications on file     Note:  This document was prepared using Dragon voice recognition software and may include unintentional dictation errors.    Gregor Hams, MD 02/20/17 [phone]

## 2017-02-20 NOTE — ED Notes (Signed)
Pt went to CT

## 2017-02-20 NOTE — ED Notes (Signed)
EKG completed; unable to Export EKG, receiving an error message; hard copy of EKG in pt's chart

## 2017-02-20 NOTE — ED Notes (Signed)

## 2017-02-23 ENCOUNTER — Other Ambulatory Visit: Payer: Medicare Other

## 2017-04-02 ENCOUNTER — Other Ambulatory Visit: Payer: Self-pay | Admitting: Nurse Practitioner

## 2017-04-02 DIAGNOSIS — G253 Myoclonus: Secondary | ICD-10-CM

## 2017-04-02 DIAGNOSIS — R55 Syncope and collapse: Secondary | ICD-10-CM

## 2017-04-08 ENCOUNTER — Ambulatory Visit
Admission: RE | Admit: 2017-04-08 | Discharge: 2017-04-08 | Disposition: A | Discharge status: Home / Self Care | Payer: Medicare Other | Source: Ambulatory Visit | Attending: Nurse Practitioner | Admitting: Nurse Practitioner

## 2017-04-08 DIAGNOSIS — G319 Degenerative disease of nervous system, unspecified: Secondary | ICD-10-CM | POA: Diagnosis not present

## 2017-04-08 DIAGNOSIS — G253 Myoclonus: Secondary | ICD-10-CM | POA: Diagnosis present

## 2017-04-08 DIAGNOSIS — R55 Syncope and collapse: Secondary | ICD-10-CM | POA: Diagnosis not present

## 2017-04-08 MED ORDER — GADOBENATE DIMEGLUMINE 529 MG/ML IV SOLN
15.0000 mL | Freq: Once | INTRAVENOUS | Status: AC | PRN
  Administered 2017-04-08: 12 mL via INTRAVENOUS

## 2017-05-04 ENCOUNTER — Other Ambulatory Visit: Payer: Self-pay | Admitting: Internal Medicine

## 2017-05-04 DIAGNOSIS — N63 Unspecified lump in unspecified breast: Secondary | ICD-10-CM

## 2017-07-22 ENCOUNTER — Other Ambulatory Visit: Payer: Self-pay | Admitting: Internal Medicine

## 2017-07-22 DIAGNOSIS — T8543XA Leakage of breast prosthesis and implant, initial encounter: Secondary | ICD-10-CM

## 2017-07-31 ENCOUNTER — Ambulatory Visit
Admission: RE | Admit: 2017-07-31 | Discharge: 2017-07-31 | Disposition: A | Discharge status: Home / Self Care | Payer: Medicare Other | Source: Ambulatory Visit | Attending: Internal Medicine | Admitting: Internal Medicine

## 2017-07-31 DIAGNOSIS — N6320 Unspecified lump in the left breast, unspecified quadrant: Secondary | ICD-10-CM | POA: Diagnosis not present

## 2017-07-31 DIAGNOSIS — T8543XA Leakage of breast prosthesis and implant, initial encounter: Secondary | ICD-10-CM

## 2018-09-09 ENCOUNTER — Other Ambulatory Visit: Payer: Self-pay | Admitting: Internal Medicine

## 2018-09-09 DIAGNOSIS — Z1231 Encounter for screening mammogram for malignant neoplasm of breast: Secondary | ICD-10-CM

## 2018-09-15 DIAGNOSIS — Z8614 Personal history of Methicillin resistant Staphylococcus aureus infection: Secondary | ICD-10-CM

## 2018-09-15 HISTORY — DX: Personal history of Methicillin resistant Staphylococcus aureus infection: Z86.14

## 2018-09-22 ENCOUNTER — Encounter (INDEPENDENT_AMBULATORY_CARE_PROVIDER_SITE_OTHER): Payer: Self-pay

## 2018-09-22 ENCOUNTER — Ambulatory Visit
Admission: RE | Admit: 2018-09-22 | Discharge: 2018-09-22 | Disposition: A | Discharge status: Home / Self Care | Payer: Medicare Other | Source: Ambulatory Visit | Attending: Internal Medicine | Admitting: Internal Medicine

## 2018-09-22 DIAGNOSIS — Z1231 Encounter for screening mammogram for malignant neoplasm of breast: Secondary | ICD-10-CM | POA: Insufficient documentation

## 2018-09-22 HISTORY — DX: Malignant neoplasm of colon, unspecified: C18.9

## 2018-11-19 ENCOUNTER — Other Ambulatory Visit: Payer: Self-pay | Admitting: Orthopedic Surgery

## 2018-11-19 ENCOUNTER — Ambulatory Visit
Admission: RE | Admit: 2018-11-19 | Discharge: 2018-11-19 | Disposition: A | Discharge status: Home / Self Care | Payer: Medicare Other | Source: Ambulatory Visit | Attending: Orthopedic Surgery | Admitting: Orthopedic Surgery

## 2018-11-19 ENCOUNTER — Other Ambulatory Visit (HOSPITAL_COMMUNITY): Payer: Self-pay | Admitting: Orthopedic Surgery

## 2018-11-19 DIAGNOSIS — S22000A Wedge compression fracture of unspecified thoracic vertebra, initial encounter for closed fracture: Secondary | ICD-10-CM | POA: Insufficient documentation

## 2018-11-24 ENCOUNTER — Encounter
Admission: RE | Admit: 2018-11-24 | Discharge: 2018-11-24 | Disposition: A | Discharge status: Home / Self Care | Payer: Medicare Other | Source: Ambulatory Visit | Attending: Orthopedic Surgery | Admitting: Orthopedic Surgery

## 2018-11-24 ENCOUNTER — Other Ambulatory Visit: Payer: Self-pay

## 2018-11-24 DIAGNOSIS — Z881 Allergy status to other antibiotic agents status: Secondary | ICD-10-CM | POA: Diagnosis not present

## 2018-11-24 DIAGNOSIS — Z7951 Long term (current) use of inhaled steroids: Secondary | ICD-10-CM | POA: Diagnosis not present

## 2018-11-24 DIAGNOSIS — Z85828 Personal history of other malignant neoplasm of skin: Secondary | ICD-10-CM | POA: Diagnosis not present

## 2018-11-24 DIAGNOSIS — Z955 Presence of coronary angioplasty implant and graft: Secondary | ICD-10-CM | POA: Diagnosis not present

## 2018-11-24 DIAGNOSIS — Z79899 Other long term (current) drug therapy: Secondary | ICD-10-CM | POA: Diagnosis not present

## 2018-11-24 DIAGNOSIS — K449 Diaphragmatic hernia without obstruction or gangrene: Secondary | ICD-10-CM | POA: Diagnosis not present

## 2018-11-24 DIAGNOSIS — E119 Type 2 diabetes mellitus without complications: Secondary | ICD-10-CM | POA: Diagnosis not present

## 2018-11-24 DIAGNOSIS — Z01810 Encounter for preprocedural cardiovascular examination: Secondary | ICD-10-CM

## 2018-11-24 DIAGNOSIS — Z7902 Long term (current) use of antithrombotics/antiplatelets: Secondary | ICD-10-CM | POA: Diagnosis not present

## 2018-11-24 DIAGNOSIS — E039 Hypothyroidism, unspecified: Secondary | ICD-10-CM | POA: Diagnosis not present

## 2018-11-24 DIAGNOSIS — I2510 Atherosclerotic heart disease of native coronary artery without angina pectoris: Secondary | ICD-10-CM | POA: Diagnosis not present

## 2018-11-24 DIAGNOSIS — M8008XA Age-related osteoporosis with current pathological fracture, vertebra(e), initial encounter for fracture: Secondary | ICD-10-CM | POA: Diagnosis not present

## 2018-11-24 DIAGNOSIS — I252 Old myocardial infarction: Secondary | ICD-10-CM | POA: Diagnosis not present

## 2018-11-24 DIAGNOSIS — Z886 Allergy status to analgesic agent status: Secondary | ICD-10-CM | POA: Diagnosis not present

## 2018-11-24 DIAGNOSIS — Z8673 Personal history of transient ischemic attack (TIA), and cerebral infarction without residual deficits: Secondary | ICD-10-CM | POA: Diagnosis not present

## 2018-11-24 DIAGNOSIS — Z885 Allergy status to narcotic agent status: Secondary | ICD-10-CM | POA: Diagnosis not present

## 2018-11-24 DIAGNOSIS — Z96652 Presence of left artificial knee joint: Secondary | ICD-10-CM | POA: Diagnosis not present

## 2018-11-24 DIAGNOSIS — Z79890 Hormone replacement therapy (postmenopausal): Secondary | ICD-10-CM | POA: Diagnosis not present

## 2018-11-24 DIAGNOSIS — I10 Essential (primary) hypertension: Secondary | ICD-10-CM

## 2018-11-24 DIAGNOSIS — K219 Gastro-esophageal reflux disease without esophagitis: Secondary | ICD-10-CM | POA: Diagnosis not present

## 2018-11-24 DIAGNOSIS — M1711 Unilateral primary osteoarthritis, right knee: Secondary | ICD-10-CM | POA: Diagnosis not present

## 2018-11-24 HISTORY — DX: Hypothyroidism, unspecified: E03.9

## 2018-11-24 HISTORY — DX: Atherosclerotic heart disease of native coronary artery without angina pectoris: I25.10

## 2018-11-24 HISTORY — DX: Cerebral infarction, unspecified: I63.9

## 2018-11-24 HISTORY — DX: Type 2 diabetes mellitus without complications: E11.9

## 2018-11-24 MED ORDER — CEFAZOLIN SODIUM-DEXTROSE 1-4 GM/50ML-% IV SOLN
1.0000 g | Freq: Once | INTRAVENOUS | Status: AC
  Administered 2018-11-25: 1 g via INTRAVENOUS

## 2018-11-24 NOTE — Pre-Procedure Instructions (Signed)
MESSAGED AND FAXED EKG TO DR Ubaldo Glassing. OK TO PROCEED BY DR Ubaldo Glassing

## 2018-11-24 NOTE — Patient Instructions (Addendum)
  Your procedure is scheduled on: Thursday November 25, 2018 Report to Same Day Surgery 2nd floor [facility] Entrance-take elevator on left to 2nd floor.  Check in with surgery information desk.) To find out your arrival time, call [phone] 1:00-3:00 PM today (November 24, 2018)   Remember: Instructions that are not followed completely may result in serious medical risk, up to and including death, or upon the discretion of your surgeon and anesthesiologist your surgery may need to be rescheduled.    __x__ 1. Do not eat any solid food (including mints, candies, chewing gum) after midnight tonight.    You may drink WATER until 2 hours before your scheduled arrival time.    __x__ 2. No Alcohol or smoking for 24 hours before or after surgery.   __x__ 3. Notify your doctor if there is any change in your medical condition (cold, fever, infections).   __x__ 5. On the morning of surgery brush your teeth with toothpaste and water.  You may rinse your mouth with mouthwash if you wish.  Do not swallow any toothpaste or mouthwash.  Please read over the following fact sheets that you were given:   [facility] Preparing for Surgery and/or MRSA Information    __x__ Use CHG Soap to bathe tonight and tomorrow before coming to the hospital as directed on instruction sheet.   Do not wear jewelry, make-up, hairpins, clips or nail polish on the day of surgery.  Do not wear lotions, powders, deodorant, or perfumes.   Do not shave between now and surgery.   Do not bring valuables to the hospital.    [facility] is not responsible for any belongings or valuables.               Eyeglasses may not be worn into surgery.  Leave your suitcase in the car. After surgery it may be brought to your room.  For patients admitted to the hospital, discharge time is determined by your treatment team.  For patients discharged on the day of surgery, you will NOT be permitted to drive yourself home.  You must  have a responsible adult with you for 24 hours after surgery.  __x__ Take these medicines on the morning of surgery with a SMALL SIP OF WATER:  1. Metoprolol  2. Atorvastatin  3. Levothyroxine  4. Nifedipine (if this is a morning medicine)  5. Pantoprazole  6. Tylenol if needed for pain  Do NOT take your Losartan the morning of surgery. If you take Losartan in the evening, you may take it tonight  __x__ Follow recommendations from Cardiologist, Pulmonologist or PCP regarding stopping Aspirin, Coumadin, Plavix, Eliquis, Effient, Pradaxa, and Pletal. (You have already stopped your Plavix per your surgeon on 11/19/2018)  __x__ Do not take any Anti-inflammatories such as Advil, Ibuprofen, Motrin, Aleve, Naproxen, Naprosyn, BC/Goodies powders or aspirin products. You may continue to take Tylenol and Celebrex.   __x__ Do not take any over the counter supplements.

## 2018-11-25 ENCOUNTER — Ambulatory Visit: Payer: Medicare Other | Admitting: Anesthesiology

## 2018-11-25 ENCOUNTER — Encounter: Payer: Self-pay | Admitting: *Deleted

## 2018-11-25 ENCOUNTER — Ambulatory Visit
Admission: RE | Admit: 2018-11-25 | Discharge: 2018-11-25 | Disposition: A | Discharge status: Home / Self Care | Payer: Medicare Other | Attending: Orthopedic Surgery | Admitting: Orthopedic Surgery

## 2018-11-25 ENCOUNTER — Encounter
Admission: RE | Disposition: A | Discharge status: Home / Self Care | Payer: Self-pay | Source: Home / Self Care | Attending: Orthopedic Surgery

## 2018-11-25 ENCOUNTER — Other Ambulatory Visit: Payer: Self-pay

## 2018-11-25 ENCOUNTER — Ambulatory Visit: Payer: Medicare Other

## 2018-11-25 DIAGNOSIS — Z955 Presence of coronary angioplasty implant and graft: Secondary | ICD-10-CM | POA: Insufficient documentation

## 2018-11-25 DIAGNOSIS — I252 Old myocardial infarction: Secondary | ICD-10-CM | POA: Insufficient documentation

## 2018-11-25 DIAGNOSIS — Z7902 Long term (current) use of antithrombotics/antiplatelets: Secondary | ICD-10-CM | POA: Insufficient documentation

## 2018-11-25 DIAGNOSIS — Z881 Allergy status to other antibiotic agents status: Secondary | ICD-10-CM | POA: Insufficient documentation

## 2018-11-25 DIAGNOSIS — Z886 Allergy status to analgesic agent status: Secondary | ICD-10-CM | POA: Insufficient documentation

## 2018-11-25 DIAGNOSIS — Z419 Encounter for procedure for purposes other than remedying health state, unspecified: Secondary | ICD-10-CM

## 2018-11-25 DIAGNOSIS — I2510 Atherosclerotic heart disease of native coronary artery without angina pectoris: Secondary | ICD-10-CM | POA: Insufficient documentation

## 2018-11-25 DIAGNOSIS — Z79890 Hormone replacement therapy (postmenopausal): Secondary | ICD-10-CM | POA: Insufficient documentation

## 2018-11-25 DIAGNOSIS — K219 Gastro-esophageal reflux disease without esophagitis: Secondary | ICD-10-CM | POA: Insufficient documentation

## 2018-11-25 DIAGNOSIS — I10 Essential (primary) hypertension: Secondary | ICD-10-CM | POA: Insufficient documentation

## 2018-11-25 DIAGNOSIS — E039 Hypothyroidism, unspecified: Secondary | ICD-10-CM | POA: Insufficient documentation

## 2018-11-25 DIAGNOSIS — Z96652 Presence of left artificial knee joint: Secondary | ICD-10-CM | POA: Insufficient documentation

## 2018-11-25 DIAGNOSIS — M1711 Unilateral primary osteoarthritis, right knee: Secondary | ICD-10-CM | POA: Insufficient documentation

## 2018-11-25 DIAGNOSIS — M8008XA Age-related osteoporosis with current pathological fracture, vertebra(e), initial encounter for fracture: Secondary | ICD-10-CM | POA: Insufficient documentation

## 2018-11-25 DIAGNOSIS — Z7951 Long term (current) use of inhaled steroids: Secondary | ICD-10-CM | POA: Insufficient documentation

## 2018-11-25 DIAGNOSIS — Z885 Allergy status to narcotic agent status: Secondary | ICD-10-CM | POA: Insufficient documentation

## 2018-11-25 DIAGNOSIS — Z8673 Personal history of transient ischemic attack (TIA), and cerebral infarction without residual deficits: Secondary | ICD-10-CM | POA: Insufficient documentation

## 2018-11-25 DIAGNOSIS — Z85828 Personal history of other malignant neoplasm of skin: Secondary | ICD-10-CM | POA: Insufficient documentation

## 2018-11-25 DIAGNOSIS — K449 Diaphragmatic hernia without obstruction or gangrene: Secondary | ICD-10-CM | POA: Insufficient documentation

## 2018-11-25 DIAGNOSIS — E119 Type 2 diabetes mellitus without complications: Secondary | ICD-10-CM | POA: Insufficient documentation

## 2018-11-25 DIAGNOSIS — Z79899 Other long term (current) drug therapy: Secondary | ICD-10-CM | POA: Insufficient documentation

## 2018-11-25 HISTORY — PX: KYPHOPLASTY: SHX5884

## 2018-11-25 LAB — GLUCOSE, CAPILLARY
Glucose-Capillary: 111 mg/dL — ABNORMAL HIGH (ref 70–99)
Glucose-Capillary: 122 mg/dL — ABNORMAL HIGH (ref 70–99)

## 2018-11-25 SURGERY — KYPHOPLASTY
Anesthesia: Monitor Anesthesia Care | Site: Back

## 2018-11-25 MED ORDER — BUPIVACAINE-EPINEPHRINE (PF) 0.5% -1:200000 IJ SOLN
INTRAMUSCULAR | Status: AC
  Filled 2018-11-25: qty 30

## 2018-11-25 MED ORDER — FENTANYL CITRATE (PF) 100 MCG/2ML IJ SOLN
INTRAMUSCULAR | Status: DC | PRN
  Administered 2018-11-25 (×4): 25 ug via INTRAVENOUS

## 2018-11-25 MED ORDER — FENTANYL CITRATE (PF) 100 MCG/2ML IJ SOLN
INTRAMUSCULAR | Status: AC
  Filled 2018-11-25: qty 2

## 2018-11-25 MED ORDER — ONDANSETRON HCL 4 MG/2ML IJ SOLN
INTRAMUSCULAR | Status: AC
  Filled 2018-11-25: qty 2

## 2018-11-25 MED ORDER — LIDOCAINE HCL 1 % IJ SOLN
INTRAMUSCULAR | Status: DC | PRN
  Administered 2018-11-25: 30 mL

## 2018-11-25 MED ORDER — BUPIVACAINE-EPINEPHRINE (PF) 0.5% -1:200000 IJ SOLN
INTRAMUSCULAR | Status: DC | PRN
  Administered 2018-11-25: 20 mL

## 2018-11-25 MED ORDER — ONDANSETRON HCL 4 MG/2ML IJ SOLN
4.0000 mg | Freq: Once | INTRAMUSCULAR | Status: AC | PRN
  Administered 2018-11-25: 4 mg via INTRAVENOUS

## 2018-11-25 MED ORDER — FENTANYL CITRATE (PF) 100 MCG/2ML IJ SOLN
25.0000 ug | INTRAMUSCULAR | Status: DC | PRN
  Administered 2018-11-25 (×2): 25 ug via INTRAVENOUS

## 2018-11-25 MED ORDER — IOPAMIDOL (ISOVUE-M 200) INJECTION 41%: INTRAMUSCULAR | Status: DC | PRN

## 2018-11-25 MED ORDER — TRAMADOL HCL 50 MG PO TABS: 50.0000 mg | ORAL_TABLET | Freq: Four times a day (QID) | ORAL | Status: DC | PRN

## 2018-11-25 MED ORDER — SODIUM CHLORIDE (PF) 0.9 % IJ SOLN
INTRAMUSCULAR | Status: DC | PRN
  Administered 2018-11-25: 30 mL

## 2018-11-25 MED ORDER — PROPOFOL 10 MG/ML IV BOLUS
INTRAVENOUS | Status: DC | PRN
  Administered 2018-11-25 (×7): 10 mg via INTRAVENOUS

## 2018-11-25 MED ORDER — SODIUM CHLORIDE 0.9 % IV SOLN
INTRAVENOUS | Status: DC
  Administered 2018-11-25: 12:00:00 via INTRAVENOUS

## 2018-11-25 MED ORDER — LIDOCAINE HCL (PF) 1 % IJ SOLN
INTRAMUSCULAR | Status: AC
  Filled 2018-11-25: qty 30

## 2018-11-25 MED ORDER — CEFAZOLIN SODIUM-DEXTROSE 1-4 GM/50ML-% IV SOLN
INTRAVENOUS | Status: AC
  Filled 2018-11-25: qty 50

## 2018-11-25 MED ORDER — FENTANYL CITRATE (PF) 100 MCG/2ML IJ SOLN
INTRAMUSCULAR | Status: AC
  Administered 2018-11-25: 25 ug via INTRAVENOUS
  Filled 2018-11-25: qty 2

## 2018-11-25 SURGICAL SUPPLY — 22 items
CEMENT KYPHON CX01A KIT/MIXER (Cement) ×3 IMPLANT
COVER WAND RF STERILE (DRAPES) ×3 IMPLANT
DERMABOND ADVANCED (GAUZE/BANDAGES/DRESSINGS) ×2
DERMABOND ADVANCED .7 DNX12 (GAUZE/BANDAGES/DRESSINGS) ×1 IMPLANT
DEVICE BIOPSY BONE KYPH (INSTRUMENTS) ×2 IMPLANT
DEVICE BIOPSY BONE KYPHX (INSTRUMENTS) ×3 IMPLANT
DRAPE C-ARM XRAY 36X54 (DRAPES) ×3 IMPLANT
DURAPREP 26ML APPLICATOR (WOUND CARE) ×3 IMPLANT
FEE RENTAL RFA GENERATOR (MISCELLANEOUS) IMPLANT
GLOVE SURG SYN 9.0  PF PI (GLOVE) ×2
GLOVE SURG SYN 9.0 PF PI (GLOVE) ×1 IMPLANT
GOWN SRG 2XL LVL 4 RGLN SLV (GOWNS) ×1 IMPLANT
GOWN STRL NON-REIN 2XL LVL4 (GOWNS) ×2
GOWN STRL REUS W/ TWL LRG LVL3 (GOWN DISPOSABLE) ×1 IMPLANT
GOWN STRL REUS W/TWL LRG LVL3 (GOWN DISPOSABLE) ×2
PACK KYPHOPLASTY (MISCELLANEOUS) ×3 IMPLANT
RENTAL RFA  GENERATOR (MISCELLANEOUS)
RENTAL RFA GENERATOR (MISCELLANEOUS) IMPLANT
STRAP SAFETY 5IN WIDE (MISCELLANEOUS) ×3 IMPLANT
TRAY KYPHOPAK 15/2 EXPRESS (KITS) ×2 IMPLANT
TRAY KYPHOPAK 15/3 EXPRESS 1ST (MISCELLANEOUS) ×1 IMPLANT
TRAY KYPHOPAK 20/3 EXPRESS 1ST (MISCELLANEOUS) ×1 IMPLANT

## 2018-11-25 NOTE — Transfer of Care (Signed)
Immediate Anesthesia Transfer of Care Note  Patient: Rachael Humphrey  Procedure(s) Performed: KYPHOPLASTY T11, DIABETIC (N/A Back)  Patient Location: PACU  Anesthesia Type:MAC  Level of Consciousness: awake, alert  and oriented  Airway & Oxygen Therapy: Patient Spontanous Breathing and Patient connected to nasal cannula oxygen  Post-op Assessment: Report given to RN and Post -op Vital signs reviewed and stable  Post vital signs: Reviewed and stable  Last Vitals:  Vitals Value Taken Time  BP 148/87 11/25/2018 12:35 PM  Temp    Pulse 73 11/25/2018 12:39 PM  Resp 21 11/25/2018 12:39 PM  SpO2 97 % 11/25/2018 12:39 PM  Vitals shown include unvalidated device data.  Last Pain:  Vitals:   11/25/18 1016  TempSrc: Oral  PainSc: 0-No pain         Complications: No apparent anesthesia complications

## 2018-11-25 NOTE — H&P (Signed)
Reviewed paper H+P, will be scanned into chart. No changes noted. MRI showed T11 compression fracture Plan T 11 kyphoplasty today

## 2018-11-25 NOTE — Discharge Instructions (Addendum)
Take it easy today and tomorrow.  Resume more normal activities on Saturday.  Remove Band-Aids on Saturday then okay to shower.  Call office if you are having significant pain after surgery.  [place]   1) The drugs that you were given will stay in your system until tomorrow so for the next 24 hours you should not:  A) Drive an automobile B) Make any legal decisions C) Drink any alcoholic beverage   2) You may resume regular meals tomorrow.  Today it is better to start with liquids and gradually work up to solid foods.  You may eat anything you prefer, but it is better to start with liquids, then soup and crackers, and gradually work up to solid foods.   3) Please notify your doctor immediately if you have any unusual bleeding, trouble breathing, redness and pain at the surgery site, drainage, fever, or pain not relieved by medication.    4) Additional Instructions:        Please contact your physician with any problems or Same Day Surgery at [phone], Monday through Friday 6 am to 4 pm, or [place] at [facility] number at [phone].

## 2018-11-25 NOTE — Anesthesia Preprocedure Evaluation (Signed)
Anesthesia Evaluation  Patient identified by MRN, date of birth, ID band Patient awake    Reviewed: Allergy & Precautions, H&P , NPO status , Patient's Chart, lab work & pertinent test results, reviewed documented beta blocker date and time   History of Anesthesia Complications (+) PONV and history of anesthetic complications  Airway Mallampati: II  TM Distance: >3 FB Neck ROM: full    Dental no notable dental hx. (+) Teeth Intact   Pulmonary neg pulmonary ROS,    Pulmonary exam normal breath sounds clear to auscultation       Cardiovascular Exercise Tolerance: Good hypertension, + CAD and + Past MI   Rhythm:regular Rate:Normal     Neuro/Psych CVA, No Residual Symptoms negative psych ROS   GI/Hepatic Neg liver ROS, hiatal hernia, GERD  ,  Endo/Other  diabetesHypothyroidism   Renal/GU Renal disease     Musculoskeletal   Abdominal   Peds  Hematology negative hematology ROS (+)   Anesthesia Other Findings   Reproductive/Obstetrics negative OB ROS                             Anesthesia Physical Anesthesia Plan  ASA: III  Anesthesia Plan: MAC   Post-op Pain Management:    Induction:   PONV Risk Score and Plan:   Airway Management Planned:   Additional Equipment:   Intra-op Plan:   Post-operative Plan:   Informed Consent: I have reviewed the patients History and Physical, chart, labs and discussed the procedure including the risks, benefits and alternatives for the proposed anesthesia with the patient or authorized representative who has indicated his/her understanding and acceptance.       Plan Discussed with: CRNA  Anesthesia Plan Comments:         Anesthesia Quick Evaluation

## 2018-11-25 NOTE — Op Note (Signed)
Date November 25, 2018  time 12:34 PM   PATIENT:  Rachael Humphrey   PRE-OPERATIVE DIAGNOSIS:  closed wedge compression fracture of T11   POST-OPERATIVE DIAGNOSIS:  closed wedge compression fracture of T11   PROCEDURE:  Procedure(s): KYPHOPLASTY T11  SURGEON: Laurene Footman, MD   ASSISTANTS: None   ANESTHESIA:   local and MAC   EBL:  No intake/output data recorded.   BLOOD ADMINISTERED:none   DRAINS: none    LOCAL MEDICATIONS USED:  MARCAINE    and XYLOCAINE    SPECIMEN:   None   DISPOSITION OF SPECIMEN:  Not applicable   COUNTS:  YES   TOURNIQUET:  * No tourniquets in log *   IMPLANTS: Bone cement   DICTATION: .Dragon Dictation  patient was brought to the operating room and after adequate anesthesia was obtained the patient was placed prone.  C arm was brought in in good visualization of the affected level obtained on both AP and lateral projections.  After patient identification and timeout procedures were completed, local anesthetic was infiltrated with 10 cc 1% Xylocaine infiltrated subcutaneously.  This is done in the area on both sides of the planned approach.  The back was then prepped and draped in the usual sterile manner and repeat timeout procedure carried out.  A spinal needle was brought down to the pedicle on the both sides of  T11 and a 50-50 mix of 1% Xylocaine half percent Sensorcaine with epinephrine total of 20 cc injected.  After allowing this to set a small incision was made and the trocar was advanced into the vertebral body in an transpedicular fashion.  Biopsy was attempted but not obtained first placing the trocar in the left and in the right. Drilling was carried out balloon inserted with inflation to  2 cc on both sides.  When the cement was appropriate consistency total of 4 cc were injected into the vertebral body without extravasation, good fill superior to inferior endplates and from right to left sides along the inferior endplate.  After the cement had  set the trochar was removed and permanent C-arm views obtained.  The wound was closed with Dermabond followed by Band-Aid   PLAN OF CARE: Discharge to home after PACU   PATIENT DISPOSITION:  PACU - hemodynamically stable.

## 2018-11-25 NOTE — Progress Notes (Signed)
Moving toes and legs well

## 2018-11-25 NOTE — Anesthesia Post-op Follow-up Note (Signed)
Anesthesia QCDR form completed.        

## 2018-11-26 NOTE — Anesthesia Postprocedure Evaluation (Signed)
Anesthesia Post Note  Patient: Rachael Humphrey  Procedure(s) Performed: KYPHOPLASTY T11, DIABETIC (N/A Back)  Patient location during evaluation: PACU Anesthesia Type: MAC Level of consciousness: awake and alert Pain management: pain level controlled Vital Signs Assessment: post-procedure vital signs reviewed and stable Respiratory status: spontaneous breathing, nonlabored ventilation, respiratory function stable and patient connected to nasal cannula oxygen Cardiovascular status: blood pressure returned to baseline and stable Postop Assessment: no apparent nausea or vomiting Anesthetic complications: no     Last Vitals:  Vitals:   11/25/18 1410 11/25/18 1447  BP: (!) 176/92 (!) 167/93  Pulse: 66 63  Resp: 14 16  Temp: (!) 36.3 C   SpO2: 92% 95%    Last Pain:  Vitals:   11/26/18 0901  TempSrc:   PainSc: 3                  Molli Barrows

## 2019-04-08 ENCOUNTER — Emergency Department: Payer: Medicare Other

## 2019-04-08 ENCOUNTER — Other Ambulatory Visit: Payer: Self-pay

## 2019-04-08 ENCOUNTER — Encounter: Payer: Self-pay | Admitting: Emergency Medicine

## 2019-04-08 ENCOUNTER — Inpatient Hospital Stay
Admission: EM | Admit: 2019-04-08 | Discharge: 2019-04-10 | DRG: 563 | Disposition: A | Discharge status: Home Health | Payer: Medicare Other | Attending: Internal Medicine | Admitting: Internal Medicine

## 2019-04-08 DIAGNOSIS — Z20828 Contact with and (suspected) exposure to other viral communicable diseases: Secondary | ICD-10-CM | POA: Diagnosis not present

## 2019-04-08 DIAGNOSIS — I10 Essential (primary) hypertension: Secondary | ICD-10-CM | POA: Diagnosis present

## 2019-04-08 DIAGNOSIS — E119 Type 2 diabetes mellitus without complications: Secondary | ICD-10-CM | POA: Diagnosis not present

## 2019-04-08 DIAGNOSIS — S82044A Nondisplaced comminuted fracture of right patella, initial encounter for closed fracture: Secondary | ICD-10-CM

## 2019-04-08 DIAGNOSIS — M25561 Pain in right knee: Secondary | ICD-10-CM | POA: Diagnosis present

## 2019-04-08 DIAGNOSIS — Z79890 Hormone replacement therapy (postmenopausal): Secondary | ICD-10-CM | POA: Diagnosis not present

## 2019-04-08 DIAGNOSIS — Z7902 Long term (current) use of antithrombotics/antiplatelets: Secondary | ICD-10-CM | POA: Diagnosis not present

## 2019-04-08 DIAGNOSIS — E039 Hypothyroidism, unspecified: Secondary | ICD-10-CM | POA: Diagnosis not present

## 2019-04-08 DIAGNOSIS — I2510 Atherosclerotic heart disease of native coronary artery without angina pectoris: Secondary | ICD-10-CM | POA: Diagnosis not present

## 2019-04-08 DIAGNOSIS — W010XXA Fall on same level from slipping, tripping and stumbling without subsequent striking against object, initial encounter: Secondary | ICD-10-CM | POA: Diagnosis not present

## 2019-04-08 DIAGNOSIS — S82011A Displaced osteochondral fracture of right patella, initial encounter for closed fracture: Secondary | ICD-10-CM | POA: Diagnosis not present

## 2019-04-08 DIAGNOSIS — K219 Gastro-esophageal reflux disease without esophagitis: Secondary | ICD-10-CM | POA: Diagnosis present

## 2019-04-08 DIAGNOSIS — Z955 Presence of coronary angioplasty implant and graft: Secondary | ICD-10-CM

## 2019-04-08 DIAGNOSIS — I252 Old myocardial infarction: Secondary | ICD-10-CM | POA: Diagnosis not present

## 2019-04-08 DIAGNOSIS — Z7951 Long term (current) use of inhaled steroids: Secondary | ICD-10-CM | POA: Diagnosis not present

## 2019-04-08 DIAGNOSIS — Z7982 Long term (current) use of aspirin: Secondary | ICD-10-CM | POA: Diagnosis not present

## 2019-04-08 DIAGNOSIS — S82091A Other fracture of right patella, initial encounter for closed fracture: Secondary | ICD-10-CM | POA: Diagnosis present

## 2019-04-08 DIAGNOSIS — Z8673 Personal history of transient ischemic attack (TIA), and cerebral infarction without residual deficits: Secondary | ICD-10-CM

## 2019-04-08 DIAGNOSIS — F329 Major depressive disorder, single episode, unspecified: Secondary | ICD-10-CM | POA: Diagnosis not present

## 2019-04-08 DIAGNOSIS — Y92480 Sidewalk as the place of occurrence of the external cause: Secondary | ICD-10-CM | POA: Diagnosis not present

## 2019-04-08 DIAGNOSIS — Z85038 Personal history of other malignant neoplasm of large intestine: Secondary | ICD-10-CM | POA: Diagnosis not present

## 2019-04-08 DIAGNOSIS — Z79899 Other long term (current) drug therapy: Secondary | ICD-10-CM

## 2019-04-08 LAB — CBC
HCT: 39.7 % (ref 36.0–46.0)
Hemoglobin: 13.5 g/dL (ref 12.0–15.0)
MCH: 31.5 pg (ref 26.0–34.0)
MCHC: 34 g/dL (ref 30.0–36.0)
MCV: 92.5 fL (ref 80.0–100.0)
Platelets: 212 10*3/uL (ref 150–400)
RBC: 4.29 MIL/uL (ref 3.87–5.11)
RDW: 13.2 % (ref 11.5–15.5)
WBC: 6.9 10*3/uL (ref 4.0–10.5)
nRBC: 0 % (ref 0.0–0.2)

## 2019-04-08 LAB — COMPREHENSIVE METABOLIC PANEL
ALT: 28 U/L (ref 0–44)
AST: 27 U/L (ref 15–41)
Albumin: 4.1 g/dL (ref 3.5–5.0)
Alkaline Phosphatase: 70 U/L (ref 38–126)
Anion gap: 11 (ref 5–15)
BUN: 17 mg/dL (ref 8–23)
CO2: 22 mmol/L (ref 22–32)
Calcium: 9 mg/dL (ref 8.9–10.3)
Chloride: 105 mmol/L (ref 98–111)
Creatinine, Ser: 0.52 mg/dL (ref 0.44–1.00)
GFR calc Af Amer: >60 mL/min (ref >60)
GFR calc non Af Amer: >60 mL/min (ref >60)
Glucose, Bld: 117 mg/dL — ABNORMAL HIGH (ref 70–99)
Potassium: 4 mmol/L (ref 3.5–5.1)
Sodium: 138 mmol/L (ref 135–145)
Total Bilirubin: 1 mg/dL (ref 0.3–1.2)
Total Protein: 6.7 g/dL (ref 6.5–8.1)

## 2019-04-08 LAB — SARS CORONAVIRUS 2 BY RT PCR (HOSPITAL ORDER, PERFORMED IN ~~LOC~~ HOSPITAL LAB): SARS Coronavirus 2: NEGATIVE

## 2019-04-08 MED ORDER — ACETAMINOPHEN 650 MG RE SUPP: 650.0000 mg | Freq: Four times a day (QID) | RECTAL | Status: DC | PRN

## 2019-04-08 MED ORDER — DOCUSATE SODIUM 100 MG PO CAPS
100.0000 mg | ORAL_CAPSULE | Freq: Two times a day (BID) | ORAL | Status: DC
  Administered 2019-04-08 – 2019-04-10 (×4): 100 mg via ORAL
  Filled 2019-04-08 (×4): qty 1

## 2019-04-08 MED ORDER — ONDANSETRON HCL 4 MG/2ML IJ SOLN: 4.0000 mg | Freq: Four times a day (QID) | INTRAMUSCULAR | Status: DC | PRN

## 2019-04-08 MED ORDER — NIFEDIPINE ER OSMOTIC RELEASE 30 MG PO TB24
30.0000 mg | ORAL_TABLET | Freq: Every day | ORAL | Status: DC
  Administered 2019-04-09 – 2019-04-10 (×2): 30 mg via ORAL
  Filled 2019-04-08 (×2): qty 1

## 2019-04-08 MED ORDER — ASPIRIN EC 325 MG PO TBEC
325.0000 mg | DELAYED_RELEASE_TABLET | Freq: Every day | ORAL | Status: DC
  Administered 2019-04-09 – 2019-04-10 (×2): 325 mg via ORAL
  Filled 2019-04-08 (×2): qty 1

## 2019-04-08 MED ORDER — ENOXAPARIN SODIUM 40 MG/0.4ML ~~LOC~~ SOLN
40.0000 mg | SUBCUTANEOUS | Status: DC
  Administered 2019-04-08 – 2019-04-09 (×2): 40 mg via SUBCUTANEOUS
  Filled 2019-04-08 (×2): qty 0.4

## 2019-04-08 MED ORDER — MELOXICAM 7.5 MG PO TABS
7.5000 mg | ORAL_TABLET | Freq: Every day | ORAL | Status: DC
  Administered 2019-04-08 – 2019-04-10 (×3): 7.5 mg via ORAL
  Filled 2019-04-08 (×3): qty 1

## 2019-04-08 MED ORDER — ACETAMINOPHEN 325 MG PO TABS: 650.0000 mg | ORAL_TABLET | Freq: Four times a day (QID) | ORAL | Status: DC | PRN

## 2019-04-08 MED ORDER — ONDANSETRON HCL 4 MG PO TABS: 4.0000 mg | ORAL_TABLET | Freq: Four times a day (QID) | ORAL | Status: DC | PRN

## 2019-04-08 MED ORDER — BISACODYL 5 MG PO TBEC: 5.0000 mg | DELAYED_RELEASE_TABLET | Freq: Every day | ORAL | Status: DC | PRN

## 2019-04-08 MED ORDER — TRAZODONE HCL 50 MG PO TABS: 25.0000 mg | ORAL_TABLET | Freq: Every evening | ORAL | Status: DC | PRN

## 2019-04-08 MED ORDER — LORATADINE 10 MG PO TABS
10.0000 mg | ORAL_TABLET | Freq: Every day | ORAL | Status: DC
  Administered 2019-04-09 – 2019-04-10 (×2): 10 mg via ORAL
  Filled 2019-04-08 (×2): qty 1

## 2019-04-08 MED ORDER — SODIUM CHLORIDE 0.9 % IV SOLN
INTRAVENOUS | Status: DC
  Administered 2019-04-08: 22:00:00 via INTRAVENOUS

## 2019-04-08 MED ORDER — FLUTICASONE PROPIONATE 50 MCG/ACT NA SUSP
2.0000 | Freq: Every day | NASAL | Status: DC | PRN
  Filled 2019-04-08: qty 16

## 2019-04-08 MED ORDER — ATORVASTATIN CALCIUM 20 MG PO TABS
20.0000 mg | ORAL_TABLET | Freq: Every day | ORAL | Status: DC
  Administered 2019-04-08 – 2019-04-10 (×3): 20 mg via ORAL
  Filled 2019-04-08 (×3): qty 1

## 2019-04-08 MED ORDER — METHOCARBAMOL 1000 MG/10ML IJ SOLN
500.0000 mg | Freq: Three times a day (TID) | INTRAVENOUS | Status: DC | PRN
  Administered 2019-04-08: 23:00:00 500 mg via INTRAVENOUS
  Filled 2019-04-08 (×2): qty 5

## 2019-04-08 MED ORDER — METOPROLOL SUCCINATE ER 50 MG PO TB24
75.0000 mg | ORAL_TABLET | Freq: Every morning | ORAL | Status: DC
  Administered 2019-04-09 – 2019-04-10 (×2): 75 mg via ORAL
  Filled 2019-04-08 (×2): qty 1

## 2019-04-08 MED ORDER — LEVOTHYROXINE SODIUM 25 MCG PO TABS
25.0000 ug | ORAL_TABLET | Freq: Every day | ORAL | Status: DC
  Administered 2019-04-09 – 2019-04-10 (×2): 25 ug via ORAL
  Filled 2019-04-08 (×2): qty 1

## 2019-04-08 MED ORDER — LOSARTAN POTASSIUM 25 MG PO TABS
25.0000 mg | ORAL_TABLET | Freq: Every day | ORAL | Status: DC
  Administered 2019-04-09 – 2019-04-10 (×2): 25 mg via ORAL
  Filled 2019-04-08 (×2): qty 1

## 2019-04-08 MED ORDER — OCUVITE-LUTEIN PO CAPS
1.0000 | ORAL_CAPSULE | Freq: Two times a day (BID) | ORAL | Status: DC
  Administered 2019-04-08 – 2019-04-10 (×4): 1 via ORAL
  Filled 2019-04-08 (×5): qty 1

## 2019-04-08 MED ORDER — PANTOPRAZOLE SODIUM 40 MG PO TBEC
40.0000 mg | DELAYED_RELEASE_TABLET | Freq: Every day | ORAL | Status: DC
  Administered 2019-04-09 – 2019-04-10 (×2): 40 mg via ORAL
  Filled 2019-04-08 (×2): qty 1

## 2019-04-08 MED ORDER — HYDROCODONE-ACETAMINOPHEN 5-325 MG PO TABS
1.0000 | ORAL_TABLET | ORAL | Status: DC | PRN
  Administered 2019-04-08: 23:00:00 2 via ORAL
  Filled 2019-04-08 (×2): qty 2

## 2019-04-08 NOTE — ED Provider Notes (Addendum)
[facility] Emergency Department Provider Note  Time seen: 5:06 PM  I have reviewed the triage vital signs and the nursing notes.   HISTORY  Chief Complaint Fall   HPI Rachael Humphrey is a 85 y.o. female with a past medical history of diabetes, gastric reflux, hypertension, CVA, presents to the emergency department after a fall.  According to the patient she was leaving a funeral when she tripped on the sidewalk ledge causing her to fall forward landing on her right knee.  Patient has significant swelling to the right knee.  Does take Plavix as her only blood thinner.  Denies hitting her head, denies LOC.  Also denies any recent fever cough congestion or shortness of breath.   Past Medical History:  Diagnosis Date  . Cancer [facility]) 2005   colon cancer  . Colon cancer ([place])   . Complication of anesthesia   . Coronary artery disease   . Diabetes mellitus without complication ([place])   . GERD (gastroesophageal reflux disease)   . H/O hiatal hernia   . Hemorrhoids    internal-not a problem now.  . History of kidney stones    multiple  . History of shingles    04-18-14 3 weeks now -pain only left posterior back , no outbreaks  . Hypertension   . Hypothyroidism   . Myocardial infarction ([place])    2 months ago, stent placed [facility]  . PONV (postoperative nausea and vomiting)    no recent problems  . Stroke [facility])    TIA    Patient Active Problem List   Diagnosis Date Noted  . Fall 02/12/2017  . Acquired hypothyroidism 10/06/2016  . Arm fracture 10/06/2016  . Benign neoplasm of colon 10/06/2016  . Cervical adenopathy 10/06/2016  . Benign essential hypertension 10/06/2016  . GERD (gastroesophageal reflux disease) 10/06/2016  . Hiatal hernia 10/06/2016  . History of abnormal cervical Pap smear 10/06/2016  . History of TIA (transient ischemic attack) 10/06/2016  . Hyperlipidemia, unspecified 10/06/2016  . Nephrolithiasis 10/06/2016  . Senile osteoporosis  10/06/2016  . Type 2 diabetes mellitus without complication ([place]) 84/13/2440  . Rupture of implant of right breast 09/18/2015  . Chronic back pain 03/08/2014  . Compression fracture 03/08/2014  . Coronary artery disease 03/03/2014  . Wedge compression fracture of T7 vertebra ([place]) 02/22/2014    Past Surgical History:  Procedure Laterality Date  . APPENDECTOMY    . AUGMENTATION MAMMAPLASTY Bilateral 1978  . BREAST BIOPSY Right    over 30 years ago negative  . BREAST BIOPSY Left    negative over 30 years ago  . CATARACT EXTRACTION, BILATERAL    . COLECTOMY     partial small and large instestine removed  . CORONARY ANGIOPLASTY WITH STENT PLACEMENT     02-23-14 coranary stent placed.  . CYSTOSCOPY WITH RETROGRADE PYELOGRAM, URETEROSCOPY AND STENT PLACEMENT Bilateral 05/17/2014   Procedure: CYSTOSCOPY WITH RETROGRADE PYELOGRAM, URETEROSCOPY AND STENT PLACEMENT, BASKET STONE REMOVAL;  Surgeon: Alexis Frock, MD;  Location: [facility] ORS;  Service: Urology;  Laterality: Bilateral;  . KYPHOPLASTY N/A 11/25/2018   Procedure: KYPHOPLASTY T11, DIABETIC;  Surgeon: Hessie Knows, MD;  Location: [facility] ORS;  Service: Orthopedics;  Laterality: N/A;    Prior to Admission medications   Medication Sig Start Date End Date Taking? Authorizing Provider  acetaminophen (TYLENOL) 500 MG tablet Take 1,000 mg by mouth every 8 (eight) hours as needed for moderate pain.     [provider]  aspirin 325 MG tablet Take  325 mg by mouth daily.    [provider]  atorvastatin (LIPITOR) 20 MG tablet Take 20 mg by mouth daily. Reported on 09/18/2015    [provider]  Calcium Carb-Cholecalciferol (CALCIUM 600 + D PO) Take 1 tablet by mouth daily.    [provider]  cetirizine (ZYRTEC) 10 MG tablet Take 10 mg by mouth daily. 07/18/16   [provider]  clopidogrel (PLAVIX) 75 MG tablet Take 75 mg by mouth daily.  11/12/15   [provider]  fluticasone (FLONASE) 50 MCG/ACT  nasal spray Place 2 sprays into both nostrils daily as needed for allergies.  11/12/15   [provider]  levothyroxine (SYNTHROID, LEVOTHROID) 25 MCG tablet Take 25 mcg by mouth daily before breakfast.  12/10/16   [provider]  losartan (COZAAR) 25 MG tablet Take 25 mg by mouth daily.  08/13/16   [provider]  meloxicam (MOBIC) 7.5 MG tablet TAKE 1 TABLET (7.5 MG TOTAL) BY MOUTH DAILY. Patient not taking: Reported on 02/12/2017 12/01/16   Gardiner Barefoot, DPM  metoprolol succinate (TOPROL-XL) 50 MG 24 hr tablet Take 75 mg by mouth every morning.     [provider]  montelukast (SINGULAIR) 10 MG tablet Take 10 mg by mouth at bedtime.  07/18/16   [provider]  Multiple Vitamin (MULTIVITAMIN WITH MINERALS) TABS tablet Take 1 tablet by mouth daily.    [provider]  Multiple Vitamins-Minerals (PRESERVISION AREDS 2 PO) Take 1 tablet by mouth 2 (two) times daily.     [provider]  NIFEdipine (PROCARDIA-XL/NIFEDICAL-XL) 30 MG 24 hr tablet Take 30 mg by mouth daily. 11/06/18   [provider]  pantoprazole (PROTONIX) 40 MG tablet Take 40 mg by mouth daily.  08/13/16   [provider]    Allergies  Allergen Reactions  . Codeine Nausea And Vomiting  . Darvon [Propoxyphene] Other (See Comments)    Altered mental status    . Demerol [Meperidine] Other (See Comments)    Altered mental status    . Gabapentin Other (See Comments)  . Sulfa Antibiotics Other (See Comments)  . Tramadol Other (See Comments)    "makes me crazy"  "makes me crazy"   . Voltaren [Diclofenac Sodium] Other (See Comments)    "Makes me crazy" "Makes me crazy"  . Zinc     Hurting in lower stomach     Family History  Problem Relation Age of Onset  . Stroke Mother   . Breast cancer Sister 63    Social History Social History   Tobacco Use  . Smoking status: Never Smoker  . Smokeless tobacco: Never Used  Substance Use Topics  .  Alcohol use: No  . Drug use: No    Review of Systems Constitutional: Negative for fever.  Negative for loss of consciousness. Cardiovascular: Negative for chest pain. Respiratory: Negative for shortness of breath. Gastrointestinal: Negative for abdominal pain Musculoskeletal: Positive for right knee pain and swelling Neurological: Negative for headache All other ROS negative  ____________________________________________   PHYSICAL EXAM:  VITAL SIGNS: ED Triage Vitals  Enc Vitals Group     BP 04/08/19 1644 (!) 150/87     Pulse Rate 04/08/19 1644 69     Resp 04/08/19 1644 14     Temp 04/08/19 1644 97.7 F (36.5 C)     Temp Source 04/08/19 1644 Oral     SpO2 04/08/19 1644 96 %     Weight 04/08/19 1645 119 lb 0.8 oz (  54 kg)     Height 04/08/19 1645 5\' 2"  (1.575 m)     Head Circumference --      Peak Flow --      Pain Score 04/08/19 1644 8     Pain Loc --      Pain Edu? --      Excl. in [place]? --    Constitutional: Alert and oriented. Well appearing and in no distress. Eyes: Normal exam ENT      Head: Normocephalic and atraumatic.      Mouth/Throat: Mucous membranes are moist. Cardiovascular: Normal rate, regular rhythm.  Respiratory: Normal respiratory effort without tachypnea nor retractions. Breath sounds are clear  Gastrointestinal: Soft and nontender. No distention. Musculoskeletal: Significant swelling of the right knee consistent with large joint effusion.  Neurovascular intact distally.  Pain with attempted range of motion. Neurologic:  Normal speech and language. No gross focal neurologic deficits Skin:  Skin is warm, dry and intact.  Psychiatric: Mood and affect are normal   RADIOLOGY  X-ray shows comminuted transverse fracture of the patella with a large joint effusion.  ____________________________________________   INITIAL IMPRESSION / ASSESSMENT AND PLAN / ED COURSE  Pertinent labs & imaging results that were available during my care of the patient  were reviewed by me and considered in my medical decision making (see chart for details).   Patient presents to the emergency department after a mechanical fall onto her right knee.  Patient has a very large joint effusion in the right knee.  Neurovascular intact distally.  Painful to move the right knee.  Transverse patella fracture.  I discussed patient with Dr. Harlow Mares of orthopedic surgery.  Recommends admission to the hospitalist service for operative clearance they would likely repair tomorrow.  Patient agreeable to plan.  EKG viewed and interpreted by myself shows a sinus rhythm at 73 bpm with a narrow QRS, mild left axis deviation, largely normal intervals nonspecific but no concerning ST changes.  Rachael Humphrey was evaluated in Emergency Department on 04/08/2019 for the symptoms described in the history of present illness. She was evaluated in the context of the global COVID-19 pandemic, which necessitated consideration that the patient might be at risk for infection with the SARS-CoV-2 virus that causes COVID-19. Institutional protocols and algorithms that pertain to the evaluation of patients at risk for COVID-19 are in a state of rapid change based on information released by regulatory bodies including the CDC and federal and state organizations. These policies and algorithms were followed during the patient's care in the ED.  EKG viewed and interpreted by myself shows a normal sinus rhythm at 73 bpm with a narrow QRS, normal axis, normal intervals, no concerning ST changes.  ____________________________________________   FINAL CLINICAL IMPRESSION(S) / ED DIAGNOSES  Patella fracture Hemorrhagic effusion   Harvest Dark, MD 04/08/19 1739    Harvest Dark, MD 04/08/19 5830    Harvest Dark, MD 04/24/19 [phone]

## 2019-04-08 NOTE — H&P (Signed)
[facility] NAME: Rachael Humphrey    MR#:  [id number]  DATE OF BIRTH:  22-May-1930  DATE OF ADMISSION:  04/08/2019  PRIMARY CARE PHYSICIAN: Idelle Crouch, MD   REQUESTING/REFERRING PHYSICIAN: Dr. Harvest Dark  CHIEF COMPLAINT: Fall   Chief Complaint  Patient presents with  . Fall    HISTORY OF PRESENT ILLNESS:  Rachael Humphrey  is a 85 y.o. female with a known history of pretension, CAD, diabetes mellitus type 2 had a fall and came to ER found to have right knee fracture, admitted for the same.  Patient fell at funeral place she was contacted and 1 of her friends funeral.  Damaris Schooner with patient's niece, patient has been died a month ago, patient has no children.  Saw Dr. Doy Hutching and Dr. Ubaldo Glassing today.  Denies chest pain, cough, fever, shortness of breath.  Rapid COVID-19 test is pending. ER physician spoke to orthopedic, because of medical problems recommended to admit to medicine, possible orthopedic surgery tomorrow. PAST MEDICAL HISTORY:   Past Medical History:  Diagnosis Date  . Cancer [facility]) 2005   colon cancer  . Colon cancer ([place])   . Complication of anesthesia   . Coronary artery disease   . Diabetes mellitus without complication ([place])   . GERD (gastroesophageal reflux disease)   . H/O hiatal hernia   . Hemorrhoids    internal-not a problem now.  . History of kidney stones    multiple  . History of shingles    04-18-14 3 weeks now -pain only left posterior back , no outbreaks  . Hypertension   . Hypothyroidism   . Myocardial infarction ([place])    2 months ago, stent placed [facility]  . PONV (postoperative nausea and vomiting)    no recent problems  . Stroke [facility])    TIA    PAST SURGICAL HISTOIRY:   Past Surgical History:  Procedure Laterality Date  . APPENDECTOMY    . AUGMENTATION MAMMAPLASTY Bilateral 1978  . BREAST BIOPSY Right    over 30 years ago negative  . BREAST BIOPSY Left    negative over 30 years  ago  . CATARACT EXTRACTION, BILATERAL    . COLECTOMY     partial small and large instestine removed  . CORONARY ANGIOPLASTY WITH STENT PLACEMENT     02-23-14 coranary stent placed.  . CYSTOSCOPY WITH RETROGRADE PYELOGRAM, URETEROSCOPY AND STENT PLACEMENT Bilateral 05/17/2014   Procedure: CYSTOSCOPY WITH RETROGRADE PYELOGRAM, URETEROSCOPY AND STENT PLACEMENT, BASKET STONE REMOVAL;  Surgeon: Alexis Frock, MD;  Location: [facility];  Service: Urology;  Laterality: Bilateral;  . KYPHOPLASTY N/A 11/25/2018   Procedure: KYPHOPLASTY T11, DIABETIC;  Surgeon: Hessie Knows, MD;  Location: [facility] ORS;  Service: Orthopedics;  Laterality: N/A;    SOCIAL HISTORY:   Social History   Tobacco Use  . Smoking status: Never Smoker  . Smokeless tobacco: Never Used  Substance Use Topics  . Alcohol use: No    FAMILY HISTORY:   Family History  Problem Relation Age of Onset  . Stroke Mother   . Breast cancer Sister 58    DRUG ALLERGIES:   Allergies  Allergen Reactions  . Codeine Nausea And Vomiting  . Darvon [Propoxyphene] Other (See Comments)    Altered mental status    . Demerol [Meperidine] Other (See Comments)    Altered mental status    . Gabapentin Other (See Comments)  . Sulfa Antibiotics Other (See Comments)  . Tramadol Other (  See Comments)    "makes me crazy"  "makes me crazy"   . Voltaren [Diclofenac Sodium] Other (See Comments)    "Makes me crazy" "Makes me crazy"  . Zinc     Hurting in lower stomach     REVIEW OF SYSTEMS:  CONSTITUTIONAL: No fever, fatigue or weakness.  EYES: No blurred or double vision.  EARS, NOSE, AND THROAT: No tinnitus or ear pain.  RESPIRATORY: No cough, shortness of breath, wheezing or hemoptysis.  CARDIOVASCULAR: No chest pain, orthopnea, edema.  GASTROINTESTINAL: No nausea, vomiting, diarrhea or abdominal pain.  GENITOURINARY: No dysuria, hematuria.  ENDOCRINE: No polyuria, nocturia,  HEMATOLOGY: No anemia, easy bruising or bleeding SKIN: No rash  or lesion. MUSCULOSKELETAL:  right knee pain, effusion NEUROLOGIC: No tingling, numbness, weakness.  PSYCHIATRY: No anxiety or depression.   MEDICATIONS AT HOME:   Prior to Admission medications   Medication Sig Start Date End Date Taking? Authorizing Provider  acetaminophen (TYLENOL) 500 MG tablet Take 1,000 mg by mouth every 8 (eight) hours as needed for moderate pain.     [provider]  aspirin 325 MG tablet Take 325 mg by mouth daily.    [provider]  atorvastatin (LIPITOR) 20 MG tablet Take 20 mg by mouth daily. Reported on 09/18/2015    [provider]  Calcium Carb-Cholecalciferol (CALCIUM 600 + D PO) Take 1 tablet by mouth daily.    [provider]  cetirizine (ZYRTEC) 10 MG tablet Take 10 mg by mouth daily. 07/18/16   [provider]  clopidogrel (PLAVIX) 75 MG tablet Take 75 mg by mouth daily.  11/12/15   [provider]  fluticasone (FLONASE) 50 MCG/ACT nasal spray Place 2 sprays into both nostrils daily as needed for allergies.  11/12/15   [provider]  levothyroxine (SYNTHROID, LEVOTHROID) 25 MCG tablet Take 25 mcg by mouth daily before breakfast.  12/10/16   [provider]  losartan (COZAAR) 25 MG tablet Take 25 mg by mouth daily.  08/13/16   [provider]  meloxicam (MOBIC) 7.5 MG tablet TAKE 1 TABLET (7.5 MG TOTAL) BY MOUTH DAILY. Patient not taking: Reported on 02/12/2017 12/01/16   Gardiner Barefoot, DPM  metoprolol succinate (TOPROL-XL) 50 MG 24 hr tablet Take 75 mg by mouth every morning.     [provider]  montelukast (SINGULAIR) 10 MG tablet Take 10 mg by mouth at bedtime.  07/18/16   [provider]  Multiple Vitamin (MULTIVITAMIN WITH MINERALS) TABS tablet Take 1 tablet by mouth daily.    [provider]  Multiple Vitamins-Minerals (PRESERVISION AREDS 2 PO) Take 1 tablet by mouth 2 (two) times daily.     [provider]  NIFEdipine  (PROCARDIA-XL/NIFEDICAL-XL) 30 MG 24 hr tablet Take 30 mg by mouth daily. 11/06/18   [provider]  pantoprazole (PROTONIX) 40 MG tablet Take 40 mg by mouth daily.  08/13/16   [provider]      VITAL SIGNS:  Blood pressure (!) 150/87, pulse 69, temperature 97.7 F (36.5 C), temperature source Oral, resp. rate 14, height 5\' 2"  (1.575 m), weight 54 kg, SpO2 96 %.  PHYSICAL EXAMINATION:  GENERAL:  85 y.o.-year-old patient lying in the bed with no acute distress.  EYES: Pupils equal, round, reactive to light,No scleral icterus. Extraocular muscles intact.  HEENT: Head atraumatic, normocephalic. Oropharynx and nasopharynx clear.  NECK:  Supple, no jugular venous distention. No thyroid enlargement, no tenderness.  LUNGS: Normal breath sounds bilaterally, no wheezing, rales,rhonchi or  crepitation. No use of accessory muscles of respiration.  CARDIOVASCULAR: S1, S2 normal. No murmurs, rubs, or gallops.  ABDOMEN: Soft, nontender, nondistended. Bowel sounds present. No organomegaly or mass.  EXTREMITIES: Right knee effusion, very tender to touch. NEUROLOGIC: Cranial nerves II through XII are intact. Muscle strength 5/5 in all extremities. Sensation intact. Gait not checked.  PSYCHIATRIC: The patient is alert and oriented x 3.  SKIN: No obvious rash, lesion, or ulcer.   LABORATORY PANEL:   CBC No results for input(s): WBC, HGB, HCT, PLT in the last 168 hours. ------------------------------------------------------------------------------------------------------------------  Chemistries  No results for input(s): NA, K, CL, CO2, GLUCOSE, BUN, CREATININE, CALCIUM, MG, AST, ALT, ALKPHOS, BILITOT in the last 168 hours.  Invalid input(s): GFRCGP ------------------------------------------------------------------------------------------------------------------  Cardiac Enzymes No results for input(s): TROPONINI in the last 168  hours. ------------------------------------------------------------------------------------------------------------------  RADIOLOGY:  Dg Chest 1 View  Result Date: 04/08/2019 CLINICAL DATA:  Pre operative respiratory exam. Patella fracture. EXAM: CHEST  1 VIEW COMPARISON:  Chest x-ray dated 02/12/2017 FINDINGS: The heart size and pulmonary vascularity are normal. Lungs are clear. No acute bone abnormality. Old thoracic compression fractures treated with vertebroplasty. Hiatal hernia. IMPRESSION: No active disease. Electronically Signed   By: Lorriane Shire M.D.   On: 04/08/2019 18:24   Dg Knee Complete 4 Views Right  Result Date: 04/08/2019 CLINICAL DATA:  Anterior right knee pain post fall. EXAM: RIGHT KNEE - COMPLETE 4+ VIEW COMPARISON:  None. FINDINGS: There is a comminuted predominantly transverse fracture of the patella. No other fractures are seen. Large hemorrhagic suprapatellar joint effusion. Anterior prepatellar soft tissue swelling. Vascular calcifications noted. IMPRESSION: 1. Comminuted predominantly transverse fracture of the patella. 2. Large hemorrhagic suprapatellar joint effusion. Electronically Signed   By: Fidela Salisbury M.D.   On: 04/08/2019 17:26    EKG:   Orders placed or performed during the hospital encounter of 04/08/19  . ED EKG  . ED EKG  . EKG 12-Lead  . EKG 12-Lead  Sinus rhythm with no ST-T changes, 74 bpm.  IMPRESSION AND PLAN:   85 year old female history of hypertension, diabetes mellitus type 2, CAD, status post PCI right knee fracture due to fall.  #1 right knee fracture, x-rays showed right patella fracture, patient has advanced age with multiple medical problems of hypertension, CAD, diabetes and hyperlipidemia patient is at moderate risk for surgery, spoke with Dr. Ubaldo Glassing, recommended to proceed with surgery and hold aspirin, Plavix, will put cardiology consult also.  Continue pain management, started DVT prophylaxis.. #2 essential hypertension,  controlled, continue aspirin, statins, beta-blockers. 3.  Diabetes mellitus type 2, diet controlled, continue sliding scale insulin with coverage by the hospital. 4.  Hypothyroidism: Continue Synthyroid. 5.  Back pain, history of kyphoplasty by Dr. Rudene Christians in March.  #6 history of CAD, with history of PCI in 2015 follows up with Dr. Ubaldo Glassing, patient echocardiogram last year showed normal ejection fraction with EF 55%.. Will also obtain cardiology consult.  Epic text message Dr. Ubaldo Glassing.    Discussed with niece. All the records are reviewed and case discussed with ED provider. Management plans discussed with the patient, family and they are in agreement.  CODE STATUS: Full code  TOTAL TIME TAKING CARE OF THIS PATIENT: 55 minutes.    Epifanio Lesches M.D on 04/08/2019 at 6:45 PM  Between 7am to 6pm - Pager - [phone]  After 6pm go to [email or web address] - password [email or web address] [facility]  Office  [phone]  CC: Primary care physician; Idelle Crouch, MD  Note: This dictation was prepared with Dragon dictation along with smaller phrase technology. Any transcriptional errors that result from this process are unintentional.

## 2019-04-08 NOTE — ED Triage Notes (Signed)
Pt to ED via EMS c/o mechanical fall today.  Was walking out of funeral home and tripped over cement barrier and fell onto right knee, denies LOC, is on Plavix.  Minor abrasions noted to right wrist and elbow.  Swelling noted to right knee without obvious deformity, strong bilateral pedal pulses, A&Ox4, in NAD at this time.

## 2019-04-08 NOTE — ED Notes (Signed)
ED TO INPATIENT HANDOFF REPORT  ED Nurse Name and Phone #: Wells Guiles [phone]  G Name/Age/Gender Rachael Humphrey 85 y.o. female Room/Bed: ED03A/ED03A  Code Status   Code Status: Prior  Home/SNF/Other Home Patient oriented to: self, place, time and situation Is this baseline? Yes   Triage Complete: Triage complete  Chief Complaint Fall  Triage Note Pt to ED via EMS c/o mechanical fall today.  Was walking out of funeral home and tripped over cement barrier and fell onto right knee, denies LOC, is on Plavix.  Minor abrasions noted to right wrist and elbow.  Swelling noted to right knee without obvious deformity, strong bilateral pedal pulses, A&Ox4, in NAD at this time.   Allergies Allergies  Allergen Reactions  . Codeine Nausea And Vomiting  . Darvon [Propoxyphene] Other (See Comments)    Altered mental status    . Demerol [Meperidine] Other (See Comments)    Altered mental status    . Gabapentin Other (See Comments)  . Sulfa Antibiotics Other (See Comments)  . Tramadol Other (See Comments)    "makes me crazy"  "makes me crazy"   . Voltaren [Diclofenac Sodium] Other (See Comments)    "Makes me crazy" "Makes me crazy"  . Zinc     Hurting in lower stomach     Level of Care/Admitting Diagnosis ED Disposition    ED Disposition Condition [facility] Area: [place] [[id number]]  Level of Care: Med-Surg [16]  Covid Evaluation: Asymptomatic Screening Protocol (No Symptoms)  Diagnosis: Closed patellar sleeve fracture of right knee [[id number]]  Admitting Physician: Epifanio Lesches [[id number]]  Attending Physician: Epifanio Lesches [[id number]]  Estimated length of stay: past midnight tomorrow  Certification:: I certify this patient will need inpatient services for at least 2 midnights  PT Class (Do Not Modify): Inpatient [101]  PT Acc Code (Do Not Modify): Private [1]       B Medical/Surgery History Past Medical History:  Diagnosis  Date  . Cancer [facility]) 2005   colon cancer  . Colon cancer ([place])   . Complication of anesthesia   . Coronary artery disease   . Diabetes mellitus without complication ([place])   . GERD (gastroesophageal reflux disease)   . H/O hiatal hernia   . Hemorrhoids    internal-not a problem now.  . History of kidney stones    multiple  . History of shingles    04-18-14 3 weeks now -pain only left posterior back , no outbreaks  . Hypertension   . Hypothyroidism   . Myocardial infarction ([place])    2 months ago, stent placed [facility]  . PONV (postoperative nausea and vomiting)    no recent problems  . Stroke [facility])    TIA   Past Surgical History:  Procedure Laterality Date  . APPENDECTOMY    . AUGMENTATION MAMMAPLASTY Bilateral 1978  . BREAST BIOPSY Right    over 30 years ago negative  . BREAST BIOPSY Left    negative over 30 years ago  . CATARACT EXTRACTION, BILATERAL    . COLECTOMY     partial small and large instestine removed  . CORONARY ANGIOPLASTY WITH STENT PLACEMENT     02-23-14 coranary stent placed.  . CYSTOSCOPY WITH RETROGRADE PYELOGRAM, URETEROSCOPY AND STENT PLACEMENT Bilateral 05/17/2014   Procedure: CYSTOSCOPY WITH RETROGRADE PYELOGRAM, URETEROSCOPY AND STENT PLACEMENT, BASKET STONE REMOVAL;  Surgeon: Alexis Frock, MD;  Location: [facility];  Service: Urology;  Laterality: Bilateral;  . KYPHOPLASTY N/A 11/25/2018  Procedure: KYPHOPLASTY T11, DIABETIC;  Surgeon: Hessie Knows, MD;  Location: [facility] ORS;  Service: Orthopedics;  Laterality: N/A;     A IV Location/Drains/Wounds Patient Lines/Drains/Airways Status   Active Line/Drains/Airways    Name:   Placement date:   Placement time:   Site:   Days:   Peripheral IV 04/08/19 Left Forearm   04/08/19    1829    Forearm   less than 1   Ureteral Drain/Stent Right ureter 5 Fr.   05/17/14    1409    Right ureter   1787   Ureteral Drain/Stent Left ureter 5 Fr.   05/17/14    1412    Left ureter   1787   Incision (Closed) 11/25/18 Back    11/25/18    1157     134          Intake/Output Last 24 hours No intake or output data in the 24 hours ending 04/08/19 1929  Labs/Imaging Results for orders placed or performed during the hospital encounter of 04/08/19 (from the past 48 hour(s))  CBC     Status: None   Collection Time: 04/08/19  6:30 PM  Result Value Ref Range   WBC 6.9 4.0 - 10.5 K/uL   RBC 4.29 3.87 - 5.11 MIL/uL   Hemoglobin 13.5 12.0 - 15.0 g/dL   HCT 39.7 36.0 - 46.0 %   MCV 92.5 80.0 - 100.0 fL   MCH 31.5 26.0 - 34.0 pg   MCHC 34.0 30.0 - 36.0 g/dL   RDW 13.2 11.5 - 15.5 %   Platelets 212 150 - 400 K/uL   nRBC 0.0 0.0 - 0.2 %    Comment: Performed at [facility], [address] [place], [place] [address]  Comprehensive metabolic panel     Status: Abnormal   Collection Time: 04/08/19  6:30 PM  Result Value Ref Range   Sodium 138 135 - 145 mmol/L   Potassium 4.0 3.5 - 5.1 mmol/L   Chloride 105 98 - 111 mmol/L   CO2 22 22 - 32 mmol/L   Glucose, Bld 117 (H) 70 - 99 mg/dL   BUN 17 8 - 23 mg/dL   Creatinine, Ser 0.52 0.44 - 1.00 mg/dL   Calcium 9.0 8.9 - 10.3 mg/dL   Total Protein 6.7 6.5 - 8.1 g/dL   Albumin 4.1 3.5 - 5.0 g/dL   AST 27 15 - 41 U/L   ALT 28 0 - 44 U/L   Alkaline Phosphatase 70 38 - 126 U/L   Total Bilirubin 1.0 0.3 - 1.2 mg/dL   GFR calc non Af Amer >60 >60 mL/min   GFR calc Af Amer >60 >60 mL/min   Anion gap 11 5 - 15    Comment: Performed at [facility], [address], [place], [place] [address]   Dg Chest 1 View  Result Date: 04/08/2019 CLINICAL DATA:  Pre operative respiratory exam. Patella fracture. EXAM: CHEST  1 VIEW COMPARISON:  Chest x-ray dated 02/12/2017 FINDINGS: The heart size and pulmonary vascularity are normal. Lungs are clear. No acute bone abnormality. Old thoracic compression fractures treated with vertebroplasty. Hiatal hernia. IMPRESSION: No active disease. Electronically Signed   By: Lorriane Shire M.D.   On: 04/08/2019 18:24   Dg Knee  Complete 4 Views Right  Result Date: 04/08/2019 CLINICAL DATA:  Anterior right knee pain post fall. EXAM: RIGHT KNEE - COMPLETE 4+ VIEW COMPARISON:  None. FINDINGS: There is a comminuted predominantly transverse fracture of the patella.  No other fractures are seen. Large hemorrhagic suprapatellar joint effusion. Anterior prepatellar soft tissue swelling. Vascular calcifications noted. IMPRESSION: 1. Comminuted predominantly transverse fracture of the patella. 2. Large hemorrhagic suprapatellar joint effusion. Electronically Signed   By: Fidela Salisbury M.D.   On: 04/08/2019 17:26    Pending Labs Unresulted Labs (From admission, onward)    Start     Ordered   04/08/19 1739  SARS Coronavirus 2 (CEPHEID - Performed in [facility] hospital lab), Hosp Order  (Asymptomatic Patients Labs)  Once,   STAT    Question:  Rule Out  Answer:  Yes   04/08/19 1738   Signed and Held  Basic metabolic panel  Tomorrow morning,   R     Signed and Held   Signed and Held  CBC  Tomorrow morning,   R     Signed and Held   Signed and Held  CBC  (enoxaparin (LOVENOX)    CrCl >/= 30 ml/min)  Once,   R    Comments: Baseline for enoxaparin therapy IF NOT ALREADY DRAWN.  Notify MD if PLT < 100 K.    Signed and Held   Signed and Held  Creatinine, serum  (enoxaparin (LOVENOX)    CrCl >/= 30 ml/min)  Once,   R    Comments: Baseline for enoxaparin therapy IF NOT ALREADY DRAWN.    Signed and Held   Signed and Held  Creatinine, serum  (enoxaparin (LOVENOX)    CrCl >/= 30 ml/min)  Weekly,   R    Comments: while on enoxaparin therapy    Signed and Held          Vitals/Pain Today's Vitals   04/08/19 1644 04/08/19 1645 04/08/19 1845 04/08/19 1929  BP: (!) 150/87   (!) 142/82  Pulse: 69   85  Resp: 14  16 18   Temp: 97.7 F (36.5 C)     TempSrc: Oral     SpO2: 96%   98%  Weight:  54 kg    Height:  5\' 2"  (1.575 m)    PainSc: 8        Isolation Precautions No active isolations  Medications Medications - No  data to display  Mobility walks with person assist Moderate fall risk   Focused Assessments Ortho   R Recommendations: See Admitting Provider Note  Report given to:   Additional Notes:

## 2019-04-09 DIAGNOSIS — S82011A Displaced osteochondral fracture of right patella, initial encounter for closed fracture: Secondary | ICD-10-CM | POA: Diagnosis not present

## 2019-04-09 DIAGNOSIS — M25561 Pain in right knee: Secondary | ICD-10-CM | POA: Diagnosis not present

## 2019-04-09 LAB — BASIC METABOLIC PANEL
Anion gap: 8 (ref 5–15)
BUN: 12 mg/dL (ref 8–23)
CO2: 24 mmol/L (ref 22–32)
Calcium: 8.6 mg/dL — ABNORMAL LOW (ref 8.9–10.3)
Chloride: 106 mmol/L (ref 98–111)
Creatinine, Ser: 0.49 mg/dL (ref 0.44–1.00)
GFR calc Af Amer: >60 mL/min (ref >60)
GFR calc non Af Amer: >60 mL/min (ref >60)
Glucose, Bld: 136 mg/dL — ABNORMAL HIGH (ref 70–99)
Potassium: 3.9 mmol/L (ref 3.5–5.1)
Sodium: 138 mmol/L (ref 135–145)

## 2019-04-09 LAB — CBC
HCT: 37.8 % (ref 36.0–46.0)
Hemoglobin: 12.6 g/dL (ref 12.0–15.0)
MCH: 31.7 pg (ref 26.0–34.0)
MCHC: 33.3 g/dL (ref 30.0–36.0)
MCV: 95.2 fL (ref 80.0–100.0)
Platelets: 188 10*3/uL (ref 150–400)
RBC: 3.97 MIL/uL (ref 3.87–5.11)
RDW: 13 % (ref 11.5–15.5)
WBC: 9.6 10*3/uL (ref 4.0–10.5)
nRBC: 0 % (ref 0.0–0.2)

## 2019-04-09 LAB — MRSA PCR SCREENING: MRSA by PCR: POSITIVE — AB

## 2019-04-09 MED ORDER — MUPIROCIN 2 % EX OINT
1.0000 "application " | TOPICAL_OINTMENT | Freq: Two times a day (BID) | CUTANEOUS | Status: DC
  Administered 2019-04-09 – 2019-04-10 (×4): 1 via NASAL
  Filled 2019-04-09: qty 22

## 2019-04-09 MED ORDER — CLOPIDOGREL BISULFATE 75 MG PO TABS
75.0000 mg | ORAL_TABLET | Freq: Every day | ORAL | Status: DC
  Administered 2019-04-09 – 2019-04-10 (×2): 75 mg via ORAL
  Filled 2019-04-09 (×2): qty 1

## 2019-04-09 MED ORDER — CHLORHEXIDINE GLUCONATE CLOTH 2 % EX PADS
6.0000 | MEDICATED_PAD | Freq: Every day | CUTANEOUS | Status: DC
  Administered 2019-04-09 – 2019-04-10 (×2): 6 via TOPICAL

## 2019-04-09 NOTE — Progress Notes (Signed)
[facility] NAME: Rachael Humphrey    MR#:  [id number]  DATE OF BIRTH:  29-Dec-1929  SUBJECTIVE: Admitted for right knee fracture, orthopedic recommended conservative management.  We will start the patient on diet, discontinue IV fluids.  Need to get physical therapy, possible discharge home tomorrow.  CHIEF COMPLAINT:   Chief Complaint  Patient presents with  . Fall    REVIEW OF SYSTEMS:   Review of Systems  Constitutional: Negative for chills and fever.  HENT: Negative for hearing loss.   Eyes: Negative for blurred vision, double vision and photophobia.  Respiratory: Negative for cough, hemoptysis and shortness of breath.   Cardiovascular: Negative for palpitations, orthopnea and leg swelling.  Gastrointestinal: Negative for abdominal pain, diarrhea and vomiting.  Genitourinary: Negative for dysuria and urgency.  Musculoskeletal: Positive for joint pain. Negative for myalgias and neck pain.  Skin: Negative for rash.  Neurological: Negative for dizziness, focal weakness, seizures, weakness and headaches.  Psychiatric/Behavioral: Negative for memory loss. The patient does not have insomnia.     DRUG ALLERGIES:   Allergies  Allergen Reactions  . Codeine Nausea And Vomiting  . Darvon [Propoxyphene] Other (See Comments)    Altered mental status    . Demerol [Meperidine] Other (See Comments)    Altered mental status    . Gabapentin Other (See Comments)  . Sulfa Antibiotics Other (See Comments)  . Tramadol Other (See Comments)    "makes me crazy"  "makes me crazy"   . Voltaren [Diclofenac Sodium] Other (See Comments)    "Makes me crazy" "Makes me crazy"  . Zinc     Hurting in lower stomach     VITALS:  Blood pressure (!) 149/89, pulse 73, temperature 97.8 F (36.6 C), resp. rate 18, height 5\' 1"  (1.549 m), weight 58 kg, SpO2 98 %.  PHYSICAL EXAMINATION:  GENERAL:  85 y.o.-year-old patient lying in the bed with  no acute distress.  EYES: Pupils equal, round, reactive to light and accommodation. No scleral icterus. Extraocular muscles intact.  HEENT: Head atraumatic, normocephalic. Oropharynx and nasopharynx clear.  NECK:  Supple, no jugular venous distention. No thyroid enlargement, no tenderness.  LUNGS: Normal breath sounds bilaterally, no wheezing, rales,rhonchi or crepitation. No use of accessory muscles of respiration.  CARDIOVASCULAR: S1, S2 normal. No murmurs, rubs, or gallops.  ABDOMEN: Soft, nontender, nondistended. Bowel sounds present. No organomegaly or mass.  EXTREMITIES: Right knee swelling better than yesterday. NEUROLOGIC: Cranial nerves II through XII are intact. Muscle strength 5/5 in all extremities. Sensation intact. Gait not checked.  PSYCHIATRIC: The patient is alert and oriented x 3.  SKIN: No obvious rash, lesion, or ulcer.    LABORATORY PANEL:   CBC Recent Labs  Lab 04/09/19 0427  WBC 9.6  HGB 12.6  HCT 37.8  PLT 188   ------------------------------------------------------------------------------------------------------------------  Chemistries  Recent Labs  Lab 04/08/19 1830 04/09/19 0427  NA 138 138  K 4.0 3.9  CL 105 106  CO2 22 24  GLUCOSE 117* 136*  BUN 17 12  CREATININE 0.52 0.49  CALCIUM 9.0 8.6*  AST 27  --   ALT 28  --   ALKPHOS 70  --   BILITOT 1.0  --    ------------------------------------------------------------------------------------------------------------------  Cardiac Enzymes No results for input(s): TROPONINI in the last 168 hours. ------------------------------------------------------------------------------------------------------------------  RADIOLOGY:  Dg Chest 1 View  Result Date: 04/08/2019 CLINICAL DATA:  Pre operative respiratory exam. Patella fracture. EXAM: CHEST  1 VIEW  COMPARISON:  Chest x-ray dated 02/12/2017 FINDINGS: The heart size and pulmonary vascularity are normal. Lungs are clear. No acute bone abnormality.  Old thoracic compression fractures treated with vertebroplasty. Hiatal hernia. IMPRESSION: No active disease. Electronically Signed   By: Lorriane Shire M.D.   On: 04/08/2019 18:24   Dg Knee Complete 4 Views Right  Result Date: 04/08/2019 CLINICAL DATA:  Anterior right knee pain post fall. EXAM: RIGHT KNEE - COMPLETE 4+ VIEW COMPARISON:  None. FINDINGS: There is a comminuted predominantly transverse fracture of the patella. No other fractures are seen. Large hemorrhagic suprapatellar joint effusion. Anterior prepatellar soft tissue swelling. Vascular calcifications noted. IMPRESSION: 1. Comminuted predominantly transverse fracture of the patella. 2. Large hemorrhagic suprapatellar joint effusion. Electronically Signed   By: Fidela Salisbury M.D.   On: 04/08/2019 17:26    EKG:   Orders placed or performed during the hospital encounter of 04/08/19  . ED EKG  . ED EKG  . EKG 12-Lead  . EKG 12-Lead    ASSESSMENT AND PLAN:   #1. right knee fracture, nonsurgical intervention recommended by orthopedic, knee immobilizer to right leg, patient will be seen by physical therapy, follow-up with orthopedic Dr. Marica Otter in 1 week, possible discharge home tomorrow with home health/rehab placement depending upon how she does with physical therapy. 2.  History of CAD, bare-metal stent placed in 2015, cardiology is consulted yesterday if patient is going to have knee surgery.  Patient right now he is not going for surgery so we will restart her aspirin, Plavix. 3.  Essential hypertension, controlled 4.  History of hypothyroidism: Continue Synthyroid. 5.  DVT prophylaxis with Lovenox Physical therapy consult, started on diet, discontinue IV fluids.     All the records are reviewed and case discussed with Care Management/Social Workerr. Management plans discussed with the patient, family and they are in agreement.  CODE STATUS: Full code  TOTAL TIME TAKING CARE OF THIS PATIENT: 35 minutes.   POSSIBLE  D/C IN 1-2 DAYS, DEPENDING ON CLINICAL CONDITION.   Epifanio Lesches M.D on 04/09/2019 at 11:12 AM  Between 7am to 6pm - Pager - [phone]  After 6pm go to [email or web address] - password [email or web address] [facility]  Office  [phone]  CC: Primary care physician; Idelle Crouch, MD   Note: This dictation was prepared with Dragon dictation along with smaller phrase technology. Any transcriptional errors that result from this process are unintentional.

## 2019-04-09 NOTE — Evaluation (Signed)
Physical Therapy Evaluation Patient Details Name: Rachael Humphrey MRN: [id number] DOB: Apr 21, 1930 Today's Date: 04/09/2019   History of Present Illness  Rachael Humphrey is an 48yoF who comes to [facility] after sustaining a fall at a funeral, with resultant patella fracture. Orthopedic team reccomending nonsurigcal management with PWB and KI donned at "all times".  Clinical Impression  Pt admitted with above diagnosis. Pt currently with functional limitations due to the deficits listed below (see "PT Problem List"). Upon entry, pt in bed, awake and agreeable to participate. The pt is alert and oriented x4, pleasant, conversational, and generally a good historian. Supervision to modified independent for bed mobility and AMB, but transfers from standard height require modA lift assist. Functional mobility assessment demonstrates increased effort/time requirements, poor tolerance, and need for physical assistance, whereas the patient performed these at a higher level of independence PTA. Pain Is not severely limiting within session. Empirically, the patient demonstrates increased risk of recurrent falls AEB gait speed <0.8m/s, forward reach <5", and prior falls history. Pt is recently widowed, now living alone, but feels she can obtain social support for groceries, meds, and transport at [place]. Pt will benefit from skilled PT intervention to increase independence and safety with basic mobility in preparation for discharge to the venue listed below.       Follow Up Recommendations Home health PT    Equipment Recommendations  Other (comment)(youth height rolling walker)    Recommendations for Other Services       Precautions / Restrictions Precautions Precautions: Fall Required Braces or Orthoses: Knee Immobilizer - Right Restrictions Weight Bearing Restrictions: Yes RLE Weight Bearing: Partial weight bearing      Mobility  Bed Mobility Overal bed mobility: Modified Independent              General bed mobility comments: additional time  Transfers Overall transfer level: Needs assistance Equipment used: Rolling walker (2 wheeled) Transfers: Sit to/from Stand Sit to Stand: Mod assist(from comode.)            Ambulation/Gait Ambulation/Gait assistance: Supervision Gait Distance (Feet): 24 Feet(57ft; 78ft) Assistive device: Rolling walker (2 wheeled) Gait Pattern/deviations: WFL(Within Functional Limits);Step-to pattern     General Gait Details: Given cues for PWB c RW; pt AMB to BR, then BR to recliner.  Stairs            Wheelchair Mobility    Modified Rankin (Stroke Patients Only)       Balance Overall balance assessment: Modified Independent;History of Falls;No apparent balance deficits (not formally assessed)                                           Pertinent Vitals/Pain Pain Assessment: No/denies pain(no pain at rest)    Home Living Family/patient expects to be discharged to:: Private residence Living Arrangements: Alone Available Help at Discharge: Neighbor;Family Type of Home: House Home Access: Stairs to enter Entrance Stairs-Rails: Right Entrance Stairs-Number of Steps: 1 Home Layout: One level Home Equipment: Cane - single point      Prior Function Level of Independence: Independent with assistive device(s)         Comments: recently widdowed; drives independent in ADL, most IADL     Hand Dominance        Extremity/Trunk Assessment   Upper Extremity Assessment Upper Extremity Assessment: Overall WFL for tasks assessed    Lower Extremity Assessment Lower Extremity  Assessment: Generalized weakness;Overall WFL for tasks assessed;RLE deficits/detail RLE: Unable to fully assess due to immobilization RLE Coordination: decreased gross motor       Communication   Communication: No difficulties  Cognition Arousal/Alertness: Awake/alert Behavior During Therapy: WFL for tasks  assessed/performed Overall Cognitive Status: Within Functional Limits for tasks assessed                                        General Comments      Exercises     Assessment/Plan    PT Assessment Patient needs continued PT services  PT Problem List Decreased strength;Decreased range of motion;Decreased mobility;Decreased knowledge of use of DME;Decreased knowledge of precautions       PT Treatment Interventions DME instruction;Gait training;Therapeutic exercise;Stair training;Functional mobility training;Therapeutic activities;Patient/family education    PT Goals (Current goals can be found in the Care Plan section)  Acute Rehab PT Goals Patient Stated Goal: return to home with help from friends/family PT Goal Formulation: With patient Time For Goal Achievement: 04/29/19 Potential to Achieve Goals: Good    Frequency 7X/week   Barriers to discharge Decreased caregiver support      Co-evaluation               AM-PAC PT "6 Clicks" Mobility  Outcome Measure Help needed turning from your back to your side while in a flat bed without using bedrails?: A Little Help needed moving from lying on your back to sitting on the side of a flat bed without using bedrails?: A Little Help needed moving to and from a bed to a chair (including a wheelchair)?: A Little Help needed standing up from a chair using your arms (e.g., wheelchair or bedside chair)?: A Little Help needed to walk in hospital room?: A Little Help needed climbing 3-5 steps with a railing? : A Little 6 Click Score: 18    End of Session Equipment Utilized During Treatment: Right knee immobilizer Activity Tolerance: Patient tolerated treatment well Patient left: in chair;with call bell/phone within reach Nurse Communication: Mobility status;Precautions;Weight bearing status PT Visit Diagnosis: Other abnormalities of gait and mobility (R26.89);Muscle weakness (generalized) (M62.81);Difficulty in  walking, not elsewhere classified (R26.2)    Time: 4481-8563 PT Time Calculation (min) (ACUTE ONLY): 27 min   Charges:   PT Evaluation $PT Eval Low Complexity: 1 Low PT Treatments $Gait Training: 8-22 mins        1:47 PM, 04/09/19 [name], PT, DPT Physical Therapist - [facility]  [phone] ([place])   , C 04/09/2019, 1:45 PM

## 2019-04-09 NOTE — TOC Initial Note (Signed)
Transition of Care [facility]) - Initial/Assessment Note    Patient Details  Name: Rachael Humphrey MRN: [id number] Date of Birth: 1930/07/25  [facility] CM/SW Contact:    Latanya Maudlin, RN Phone Number: 04/09/2019, 2:04 PM  Clinical Narrative: [facility] consulted after PT rec home health PT. Patient lives alone after her husband passed. Her niece is able to check on her but patient maintains she is independent with activities of daily living and was previously driving before her fracture. Patient has her spouse old walker but feels it is too big. We will obtain DME youth walker from [employer]. Patient agreeable to home health. CMS Medicare.gov Compare Post Acute Care list reviewed with patient and she has no preference of agency. [facility].  [facility] is [place]. [facility].                  Expected Discharge Plan: [place] Barriers to Discharge: Continued Medical Work up   Patient Goals and CMS Choice Patient states their goals for this hospitalization and ongoing recovery are:: to get home and keep things going CMS Medicare.gov Compare Post Acute Care list provided to:: Patient Choice offered to / list presented to : Patient  Expected Discharge Plan and Services Expected Discharge Plan: [place]   Discharge Planning Services: CM Consult Post Acute Care Choice: Home Health, Durable Medical Equipment Living arrangements for the past 2 months: Single Family Home                 DME Arranged: Environmental consultant youth DME Agency: [employer] Date DME Agency Contacted: 04/09/19 Time DME Agency Contacted: [phone] Representative spoke with at DME Agency: [place]: PT [place]: [place] ([place]) Date [place]: 04/09/19 Time [place]: [place] Representative spoke with at [place]: Corene Cornea  Prior Living Arrangements/Services Living arrangements for the past 2 months: [place] with:: Self Patient language and need for interpreter reviewed:: Yes Do you feel safe going back to the place where you live?: Yes      Need for Family Participation in Patient Care: No (Comment)   Current home services: Housekeeping Criminal Activity/Legal Involvement Pertinent to Current Situation/Hospitalization: No - Comment as needed  Activities of Daily Living Home Assistive Devices/Equipment: Cane (specify quad or straight) ADL Screening (condition at time of admission) Patient's cognitive ability adequate to safely complete daily activities?: Yes Is the patient deaf or have difficulty hearing?: No Does the patient have difficulty seeing, even when wearing glasses/contacts?: No Does the patient have difficulty concentrating, remembering, or making decisions?: No Patient able to express need for assistance with ADLs?: No Does the patient have difficulty dressing or bathing?: No Independently performs ADLs?: Yes (appropriate for developmental age) Does the patient have difficulty walking or climbing stairs?: No Weakness of Legs: None Weakness of Arms/Hands: None  Permission Sought/Granted                  Emotional Assessment       Orientation: : Oriented to Self, Oriented to Place, Oriented to  Time, Oriented to Situation      Admission diagnosis:  Closed nondisplaced comminuted fracture of right patella, initial encounter [S82.044A] Patient Active Problem List   Diagnosis Date Noted  . Closed patellar sleeve fracture of right knee 04/08/2019  . Fall 02/12/2017  . Acquired hypothyroidism 10/06/2016  . Arm fracture 10/06/2016  . Benign neoplasm of  colon 10/06/2016  . Cervical adenopathy 10/06/2016  . Benign essential hypertension 10/06/2016  . GERD (gastroesophageal reflux disease) 10/06/2016  . Hiatal hernia 10/06/2016  . History of abnormal cervical Pap smear 10/06/2016  . History of TIA (transient ischemic attack) 10/06/2016  . Hyperlipidemia,  unspecified 10/06/2016  . Nephrolithiasis 10/06/2016  . Senile osteoporosis 10/06/2016  . Type 2 diabetes mellitus without complication ([place]) 11/73/5670  . Rupture of implant of right breast 09/18/2015  . Chronic back pain 03/08/2014  . Compression fracture 03/08/2014  . Coronary artery disease 03/03/2014  . Wedge compression fracture of T7 vertebra ([place]) 02/22/2014   PCP:  Idelle Crouch, MD Pharmacy:   [id number] [place], [place] - [address] [place] [address] Phone: [phone] Fax: [phone]     Social Determinants of Health (SDOH) Interventions    Readmission Risk Interventions Readmission Risk Prevention Plan 04/09/2019  Post Dischage Appt Complete  Medication Screening Complete  Transportation Screening Complete  Some recent data might be hidden

## 2019-04-09 NOTE — Consult Note (Signed)
ORTHOPAEDIC CONSULTATION  REQUESTING PHYSICIAN: Epifanio Lesches, MD  Chief Complaint: right knee pain  HPI: Rachael Humphrey is a 85 y.o. female who complains of right knee pain. She was admitted yesterday after a mechanical fall causing a fracture of her right patella.  Please see H&P and ED notes for details. Denies any numbness, tingling or constitutional symptoms.  Past Medical History:  Diagnosis Date  . Cancer [facility]) 2005   colon cancer  . Colon cancer ([place])   . Complication of anesthesia   . Coronary artery disease   . Diabetes mellitus without complication ([place])   . GERD (gastroesophageal reflux disease)   . H/O hiatal hernia   . Hemorrhoids    internal-not a problem now.  . History of kidney stones    multiple  . History of shingles    04-18-14 3 weeks now -pain only left posterior back , no outbreaks  . Hypertension   . Hypothyroidism   . Myocardial infarction ([place])    2 months ago, stent placed [facility]  . PONV (postoperative nausea and vomiting)    no recent problems  . Stroke [facility])    TIA   Past Surgical History:  Procedure Laterality Date  . APPENDECTOMY    . AUGMENTATION MAMMAPLASTY Bilateral 1978  . BREAST BIOPSY Right    over 30 years ago negative  . BREAST BIOPSY Left    negative over 30 years ago  . CATARACT EXTRACTION, BILATERAL    . COLECTOMY     partial small and large instestine removed  . CORONARY ANGIOPLASTY WITH STENT PLACEMENT     02-23-14 coranary stent placed.  . CYSTOSCOPY WITH RETROGRADE PYELOGRAM, URETEROSCOPY AND STENT PLACEMENT Bilateral 05/17/2014   Procedure: CYSTOSCOPY WITH RETROGRADE PYELOGRAM, URETEROSCOPY AND STENT PLACEMENT, BASKET STONE REMOVAL;  Surgeon: Alexis Frock, MD;  Location: [facility];  Service: Urology;  Laterality: Bilateral;  . KYPHOPLASTY N/A 11/25/2018   Procedure: KYPHOPLASTY T11, DIABETIC;  Surgeon: Hessie Knows, MD;  Location: [facility] ORS;  Service: Orthopedics;  Laterality: N/A;   Social History   Socioeconomic  History  . Marital status: Married    Spouse name: Not on file  . Number of children: Not on file  . Years of education: Not on file  . Highest education level: Not on file  Occupational History  . Not on file  Social Needs  . Financial resource strain: Not on file  . Food insecurity    Worry: Not on file    Inability: Not on file  . Transportation needs    Medical: Not on file    Non-medical: Not on file  Tobacco Use  . Smoking status: Never Smoker  . Smokeless tobacco: Never Used  Substance and Sexual Activity  . Alcohol use: No  . Drug use: No  . Sexual activity: Yes  Lifestyle  . Physical activity    Days per week: Not on file    Minutes per session: Not on file  . Stress: Not on file  Relationships  . Social Herbalist on phone: Not on file    Gets together: Not on file    Attends religious service: Not on file    Active member of club or organization: Not on file    Attends meetings of clubs or organizations: Not on file    Relationship status: Not on file  Other Topics Concern  . Not on file  Social History Narrative  . Not on file   Family History  Problem Relation Age of Onset  . Stroke Mother   . Breast cancer Sister 61   Allergies  Allergen Reactions  . Codeine Nausea And Vomiting  . Darvon [Propoxyphene] Other (See Comments)    Altered mental status    . Demerol [Meperidine] Other (See Comments)    Altered mental status    . Gabapentin Other (See Comments)  . Sulfa Antibiotics Other (See Comments)  . Tramadol Other (See Comments)    "makes me crazy"  "makes me crazy"   . Voltaren [Diclofenac Sodium] Other (See Comments)    "Makes me crazy" "Makes me crazy"  . Zinc     Hurting in lower stomach    Prior to Admission medications   Medication Sig Start Date End Date Taking? Authorizing Provider  aspirin 325 MG tablet Take 325 mg by mouth daily.   Yes [provider]  atorvastatin (LIPITOR) 20 MG tablet Take 20 mg by mouth  daily. Reported on 09/18/2015   Yes [provider]  Calcium Carb-Cholecalciferol (CALCIUM 600 + D PO) Take 1 tablet by mouth daily.   Yes [provider]  cetirizine (ZYRTEC) 10 MG tablet Take 10 mg by mouth daily. 07/18/16  Yes [provider]  clopidogrel (PLAVIX) 75 MG tablet Take 75 mg by mouth daily.  11/12/15  Yes [provider]  levothyroxine (SYNTHROID, LEVOTHROID) 25 MCG tablet Take 25 mcg by mouth daily before breakfast.  12/10/16  Yes [provider]  losartan (COZAAR) 25 MG tablet Take 25 mg by mouth daily.  08/13/16  Yes [provider]  metoprolol succinate (TOPROL-XL) 50 MG 24 hr tablet Take 75 mg by mouth every morning.    Yes [provider]  montelukast (SINGULAIR) 10 MG tablet Take 10 mg by mouth at bedtime.  07/18/16  Yes [provider]  Multiple Vitamin (MULTIVITAMIN WITH MINERALS) TABS tablet Take 1 tablet by mouth daily.   Yes [provider]  Multiple Vitamins-Minerals (PRESERVISION AREDS 2 PO) Take 1 tablet by mouth 2 (two) times daily.    Yes [provider]  NIFEdipine (PROCARDIA-XL/NIFEDICAL-XL) 30 MG 24 hr tablet Take 30 mg by mouth daily. 11/06/18  Yes [provider]  pantoprazole (PROTONIX) 40 MG tablet Take 40 mg by mouth daily.  08/13/16  Yes [provider]  acetaminophen (TYLENOL) 500 MG tablet Take 1,000 mg by mouth every 8 (eight) hours as needed for moderate pain.     [provider]  fluticasone (FLONASE) 50 MCG/ACT nasal spray Place 2 sprays into both nostrils daily as needed for allergies.  11/12/15   [provider]  meloxicam (MOBIC) 7.5 MG tablet TAKE 1 TABLET (7.5 MG TOTAL) BY MOUTH DAILY. Patient not taking: Reported on 02/12/2017 12/01/16   Gardiner Barefoot, DPM   Dg Chest 1 View  Result Date: 04/08/2019 CLINICAL DATA:  Pre operative respiratory exam. Patella fracture. EXAM: CHEST  1 VIEW COMPARISON:  Chest x-ray dated 02/12/2017  FINDINGS: The heart size and pulmonary vascularity are normal. Lungs are clear. No acute bone abnormality. Old thoracic compression fractures treated with vertebroplasty. Hiatal hernia. IMPRESSION: No active disease. Electronically Signed   By: Lorriane Shire M.D.   On: 04/08/2019 18:24   Dg Knee Complete 4 Views Right  Result Date: 04/08/2019 CLINICAL DATA:  Anterior right knee pain post fall. EXAM: RIGHT KNEE - COMPLETE 4+ VIEW COMPARISON:  None. FINDINGS: There is a comminuted predominantly transverse fracture of the patella. No other fractures are seen. Large hemorrhagic suprapatellar joint  effusion. Anterior prepatellar soft tissue swelling. Vascular calcifications noted. IMPRESSION: 1. Comminuted predominantly transverse fracture of the patella. 2. Large hemorrhagic suprapatellar joint effusion. Electronically Signed   By: Fidela Salisbury M.D.   On: 04/08/2019 17:26    Positive ROS: All other systems have been reviewed and were otherwise negative with the exception of those mentioned in the HPI and as above.  Physical Exam: General: Alert, no acute distress Cardiovascular: No pedal edema Respiratory: No cyanosis, no use of accessory musculature GI: No organomegaly, abdomen is soft and non-tender Skin: No lesions in the area of chief complaint Neurologic: Sensation intact distally Psychiatric: Patient is competent for consent with normal mood and affect Lymphatic: No axillary or cervical lymphadenopathy  MUSCULOSKELETAL: right knee swelling.  No abrasions Compartments soft. Good cap refill. Motor and sensory intact distally.  Assessment: Right knee fracture, nonsurgical  Plan: 1. Discontinue NPO order 2. Knee immobilizer right lower extremity, at all times 3. Rolling walker 4. Partial weightbearing in knee immobilizer  right lower extremity 5. May follow up with Dr. Harlow Mares in 1 week at [facility] 6. Patient had previous surgery by Dr. Rudene Christians in March and  may follow up with his office if she prefers and Dr. Rudene Christians agreeable.    Carlynn Spry, PA-C    04/09/2019 10:12 AM

## 2019-04-09 NOTE — Consult Note (Signed)
Cardiology Consultation Note    Patient ID: Rachael Humphrey, MRN: [id number], DOB/AGE: [birth year] 85 y.o. Admit date: 04/08/2019   Date of Consult: 04/09/2019 Primary Physician: Idelle Crouch, MD Primary Cardiologist: Dr. Ubaldo Glassing  Chief Complaint: Right knee pain Reason for Consultation: preop  Requesting MD: Dr. Vianne Bulls  HPI: Rachael Humphrey is a 85 y.o. female with history of coronary artery disease status post PCI of the LAD with a 2.75 x 28 mm bare-metal stent in 2015, history of preserved LV function ejection fraction of 50 to 55% with mild AI, MR and TR.  She has been on dual antiplatelet therapy since her stent and is been doing well with this.  She was admitted yesterday after a mechanical fall causing a fracture of her right patella.  She has not had any exertional chest pain.  Despite her advanced age she has been fairly active.  She is grieving the loss of her husband in the recent past.  She ambulates daily.  She had a functional study in January 2019 showing no evidence of ischemia.  She denies chest pain.  Chest x-ray in the emergency room revealed no active disease.  Electrolytes were normal.  Renal function was normal.  Hemoglobin and hematocrit were normal.  Platelet count was 212,000.  She is COVID negative.  EKG revealed sinus rhythm with first-degree AV block.  No ischemia or injury.  She is hemodynamically stable  Past Medical History:  Diagnosis Date  . Cancer [facility]) 2005   colon cancer  . Colon cancer ([place])   . Complication of anesthesia   . Coronary artery disease   . Diabetes mellitus without complication ([employer])   . GERD (gastroesophageal reflux disease)   . H/O hiatal hernia   . Hemorrhoids    internal-not a problem now.  . History of kidney stones    multiple  . History of shingles    04-18-14 3 weeks now -pain only left posterior back , no outbreaks  . Hypertension   . Hypothyroidism   . Myocardial infarction ([place])    2 months ago, stent placed [facility]  . PONV  (postoperative nausea and vomiting)    no recent problems  . Stroke [facility])    TIA      Surgical History:  Past Surgical History:  Procedure Laterality Date  . APPENDECTOMY    . AUGMENTATION MAMMAPLASTY Bilateral 1978  . BREAST BIOPSY Right    over 30 years ago negative  . BREAST BIOPSY Left    negative over 30 years ago  . CATARACT EXTRACTION, BILATERAL    . COLECTOMY     partial small and large instestine removed  . CORONARY ANGIOPLASTY WITH STENT PLACEMENT     02-23-14 coranary stent placed.  . CYSTOSCOPY WITH RETROGRADE PYELOGRAM, URETEROSCOPY AND STENT PLACEMENT Bilateral 05/17/2014   Procedure: CYSTOSCOPY WITH RETROGRADE PYELOGRAM, URETEROSCOPY AND STENT PLACEMENT, BASKET STONE REMOVAL;  Surgeon: Alexis Frock, MD;  Location: [facility];  Service: Urology;  Laterality: Bilateral;  . KYPHOPLASTY N/A 11/25/2018   Procedure: KYPHOPLASTY T11, DIABETIC;  Surgeon: Hessie Knows, MD;  Location: [facility] ORS;  Service: Orthopedics;  Laterality: N/A;     Home Meds: Prior to Admission medications   Medication Sig Start Date End Date Taking? Authorizing Provider  aspirin 325 MG tablet Take 325 mg by mouth daily.   Yes [provider]  atorvastatin (LIPITOR) 20 MG tablet Take 20 mg by mouth daily. Reported on 09/18/2015   Yes [provider]  Calcium  Carb-Cholecalciferol (CALCIUM 600 + D PO) Take 1 tablet by mouth daily.   Yes [provider]  cetirizine (ZYRTEC) 10 MG tablet Take 10 mg by mouth daily. 07/18/16  Yes [provider]  clopidogrel (PLAVIX) 75 MG tablet Take 75 mg by mouth daily.  11/12/15  Yes [provider]  levothyroxine (SYNTHROID, LEVOTHROID) 25 MCG tablet Take 25 mcg by mouth daily before breakfast.  12/10/16  Yes [provider]  losartan (COZAAR) 25 MG tablet Take 25 mg by mouth daily.  08/13/16  Yes [provider]  metoprolol succinate (TOPROL-XL) 50 MG 24 hr tablet Take 75 mg by mouth every morning.    Yes [provider]  montelukast (SINGULAIR) 10 MG tablet Take 10 mg by mouth at bedtime.  07/18/16  Yes [provider]  Multiple Vitamin (MULTIVITAMIN WITH MINERALS) TABS tablet Take 1 tablet by mouth daily.   Yes [provider]  Multiple Vitamins-Minerals (PRESERVISION AREDS 2 PO) Take 1 tablet by mouth 2 (two) times daily.    Yes [provider]  NIFEdipine (PROCARDIA-XL/NIFEDICAL-XL) 30 MG 24 hr tablet Take 30 mg by mouth daily. 11/06/18  Yes [provider]  pantoprazole (PROTONIX) 40 MG tablet Take 40 mg by mouth daily.  08/13/16  Yes [provider]  acetaminophen (TYLENOL) 500 MG tablet Take 1,000 mg by mouth every 8 (eight) hours as needed for moderate pain.     [provider]  fluticasone (FLONASE) 50 MCG/ACT nasal spray Place 2 sprays into both nostrils daily as needed for allergies.  11/12/15   [provider]  meloxicam (MOBIC) 7.5 MG tablet TAKE 1 TABLET (7.5 MG TOTAL) BY MOUTH DAILY. Patient not taking: Reported on 02/12/2017 12/01/16   Gardiner Barefoot, DPM    Inpatient Medications:  . aspirin EC  325 mg Oral Daily  . atorvastatin  20 mg Oral Daily  . Chlorhexidine Gluconate Cloth  6 each Topical Q0600  . docusate sodium  100 mg Oral BID  . enoxaparin (LOVENOX) injection  40 mg Subcutaneous Q24H  . levothyroxine  25 mcg Oral Q0600  . loratadine  10 mg Oral Daily  . losartan  25 mg Oral Daily  . meloxicam  7.5 mg Oral Daily  . metoprolol succinate  75 mg Oral q morning - 10a  . multivitamin-lutein  1 capsule Oral BID  . mupirocin ointment  1 application Nasal BID  . NIFEdipine  30 mg Oral Daily  . pantoprazole  40 mg Oral Daily   . sodium chloride 75 mL/hr at 04/08/19 2208  . methocarbamol (ROBAXIN) IV 500 mg (04/08/19 2325)    Allergies:  Allergies  Allergen Reactions  . Codeine Nausea And Vomiting  . Darvon [Propoxyphene] Other (See Comments)    Altered mental status    . Demerol [Meperidine] Other (See  Comments)    Altered mental status    . Gabapentin Other (See Comments)  . Sulfa Antibiotics Other (See Comments)  . Tramadol Other (See Comments)    "makes me crazy"  "makes me crazy"   . Voltaren [Diclofenac Sodium] Other (See Comments)    "Makes me crazy" "Makes me crazy"  . Zinc     Hurting in lower stomach     Social History   Socioeconomic History  . Marital status: Married    Spouse name: Not on file  . Number of children: Not on file  . Years of education: Not on file  . Highest education level: Not on file  Occupational History  . Not on file  Social Needs  . Financial resource strain: Not on file  . Food insecurity    Worry: Not on file    Inability: Not on file  . Transportation needs    Medical: Not on file    Non-medical: Not on file  Tobacco Use  . Smoking status: Never Smoker  . Smokeless tobacco: Never Used  Substance and Sexual Activity  . Alcohol use: No  . Drug use: No  . Sexual activity: Yes  Lifestyle  . Physical activity    Days per week: Not on file    Minutes per session: Not on file  . Stress: Not on file  Relationships  . Social Herbalist on phone: Not on file    Gets together: Not on file    Attends religious service: Not on file    Active member of club or organization: Not on file    Attends meetings of clubs or organizations: Not on file    Relationship status: Not on file  . Intimate partner violence    Fear of current or ex partner: Not on file    Emotionally abused: Not on file    Physically abused: Not on file    Forced sexual activity: Not on file  Other Topics Concern  . Not on file  Social History Narrative  . Not on file     Family History  Problem Relation Age of Onset  . Stroke Mother   . Breast cancer Sister 53     Review of Systems: A 12-system review of systems was performed and is negative except as noted in the HPI.  Labs: No results for input(s): CKTOTAL, CKMB, TROPONINI in the last 72  hours. Lab Results  Component Value Date   WBC 9.6 04/09/2019   HGB 12.6 04/09/2019   HCT 37.8 04/09/2019   MCV 95.2 04/09/2019   PLT 188 04/09/2019    Recent Labs  Lab 04/08/19 1830 04/09/19 0427  NA 138 138  K 4.0 3.9  CL 105 106  CO2 22 24  BUN 17 12  CREATININE 0.52 0.49  CALCIUM 9.0 8.6*  PROT 6.7  --   BILITOT 1.0  --   ALKPHOS 70  --   ALT 28  --   AST 27  --   GLUCOSE 117* 136*   Lab Results  Component Value Date   CHOL 136 01/25/2014   HDL 34 (L) 01/25/2014   LDLCALC 58 01/25/2014   TRIG 219 (H) 01/25/2014   No results found for: DDIMER  Radiology/Studies:  Dg Chest 1 View  Result Date: 04/08/2019 CLINICAL DATA:  Pre operative respiratory exam. Patella fracture. EXAM: CHEST  1 VIEW COMPARISON:  Chest x-ray dated 02/12/2017 FINDINGS: The heart size and pulmonary vascularity are normal. Lungs are clear. No acute bone abnormality. Old thoracic compression fractures treated with vertebroplasty. Hiatal hernia. IMPRESSION: No active disease. Electronically Signed   By: Lorriane Shire M.D.   On: 04/08/2019 18:24   Dg Knee Complete 4 Views Right  Result Date: 04/08/2019 CLINICAL DATA:  Anterior right knee pain post fall. EXAM: RIGHT KNEE - COMPLETE 4+ VIEW COMPARISON:  None. FINDINGS: There is a comminuted predominantly transverse fracture of the patella. No other fractures are seen. Large hemorrhagic suprapatellar joint effusion. Anterior prepatellar soft tissue swelling. Vascular calcifications noted. IMPRESSION: 1. Comminuted predominantly transverse fracture of the patella. 2. Large hemorrhagic suprapatellar joint effusion. Electronically Signed   By: Thomas Hoff  Dimitrova M.D.   On: 04/08/2019 17:26    Wt Readings from Last 3 Encounters:  04/08/19 58 kg  11/25/18 61.1 kg  11/24/18 61.1 kg    EKG: Sinus rhythm with no ischemia.  First-degree AV block.  Physical Exam:  Blood pressure (!) 164/97, pulse 72, temperature 97.8 F (36.6 C), resp. rate 18, height 5'  1" (1.549 m), weight 58 kg, SpO2 98 %. Body mass index is 24.17 kg/m. General: Well developed, well nourished, in no acute distress. Head: Normocephalic, atraumatic, sclera non-icteric, no xanthomas, nares are without discharge.  Neck: Negative for carotid bruits. JVD not elevated. Lungs: Clear bilaterally to auscultation without wheezes, rales, or rhonchi. Breathing is unlabored. Heart: RRR with S1 S2. No murmurs, rubs, or gallops appreciated. Abdomen: Soft, non-tender, non-distended with normoactive bowel sounds. No hepatomegaly. No rebound/guarding. No obvious abdominal masses. Msk:  Strength and tone appear normal for age. Extremities: No clubbing or cyanosis. No edema.  Distal pedal pulses are 2+ and equal bilaterally. Neuro: Alert and oriented X 3. No facial asymmetry. No focal deficit. Moves all extremities spontaneously. Psych:  Responds to questions appropriately with a normal affect.     Assessment and Plan  85 year old female with history of coronary disease status post PCI in 2015 with a bare-metal stent who was admitted after a mechanical fall causing right patellar fracture.  She had a functional study in January of last year showing no ischemia.  She has no exertional chest pain.  She is quite active.  Echo showed preserved LV function.  Electrocardiogram showed sinus rhythm.  She has been on dual antiplatelet therapy since her stent due to doing well with this.  She may come off of both for the surgery if needed.  She is at moderate risk given her advanced age however well optimized from a cardiac standpoint for surgery.  If surgery is needed, would proceed with routine cardiac monitoring.  No further cardiac preop work-up indicated.  Signed, Teodoro Spray MD 04/09/2019, 8:44 AM Pager: [phone]

## 2019-04-10 DIAGNOSIS — M25561 Pain in right knee: Secondary | ICD-10-CM | POA: Diagnosis not present

## 2019-04-10 DIAGNOSIS — S82011A Displaced osteochondral fracture of right patella, initial encounter for closed fracture: Secondary | ICD-10-CM | POA: Diagnosis not present

## 2019-04-10 MED ORDER — MUPIROCIN 2 % EX OINT: 1.0000 "application " | TOPICAL_OINTMENT | Freq: Two times a day (BID) | CUTANEOUS | 0 refills | Status: DC

## 2019-04-10 MED ORDER — HYDROCODONE-ACETAMINOPHEN 5-325 MG PO TABS: 1.0000 | ORAL_TABLET | ORAL | 0 refills | Status: DC | PRN

## 2019-04-10 NOTE — Progress Notes (Signed)
Walker sent with pt 

## 2019-04-10 NOTE — Progress Notes (Signed)
DISCHARGE NOTE:  Pt given discharge instructions and script (norco), pt verbalized understanding. Pt wheeled to car by staff. Sister providing transportation.

## 2019-04-10 NOTE — Progress Notes (Signed)
Physical Therapy Treatment Patient Details Name: Rachael Humphrey MRN: [id number] DOB: June 15, 1930 Today's Date: 04/10/2019    History of Present Illness Rachael Humphrey is an 89yoF who comes to [facility] after sustaining a fall at a funeral, with resultant patella fracture. Orthopedic team reccomending nonsurigcal management with PWB and KI donned at "all times".    PT Comments    Pt was seen for mobility and strength testing, able to handle step to return home with help.  Follow up acutely as needed, and will review again tomorrow if pt does not go home today.  Her independence with transfers and min guard for safety only with gait on RW are going to require very little extra help for home.  New RW arrived and is sized correctly for pt.   Follow Up Recommendations  Home health PT     Equipment Recommendations  (youth height RW)    Recommendations for Other Services       Precautions / Restrictions Precautions Precautions: Fall Restrictions Weight Bearing Restrictions: Yes RLE Weight Bearing: Partial weight bearing RLE Partial Weight Bearing Percentage or Pounds: 50%    Mobility  Bed Mobility Overal bed mobility: Modified Independent             General bed mobility comments: additional time  Transfers Overall transfer level: Needs assistance Equipment used: Rolling walker (2 wheeled) Transfers: Sit to/from Stand Sit to Stand: Min guard         General transfer comment: pt is able to maneuver her brace to stand  Ambulation/Gait Ambulation/Gait assistance: Min guard Gait Distance (Feet): 70 Feet Assistive device: Rolling walker (2 wheeled) Gait Pattern/deviations: Step-through pattern;Decreased stance time - right Gait velocity: reduced Gait velocity interpretation: <1.31 ft/sec, indicative of household ambulator     Stairs Stairs: Yes Stairs assistance: Min guard Stair Management: With walker Number of Stairs: 1 General stair comments: used single step  repeatedly to ensure she could ascend as needed   Wheelchair Mobility    Modified Rankin (Stroke Patients Only)       Balance Overall balance assessment: Needs assistance Sitting-balance support: Feet supported Sitting balance-Leahy Scale: Good     Standing balance support: Bilateral upper extremity supported;During functional activity Standing balance-Leahy Scale: Fair                              Cognition Arousal/Alertness: Awake/alert Behavior During Therapy: WFL for tasks assessed/performed Overall Cognitive Status: Within Functional Limits for tasks assessed                                        Exercises General Exercises - Lower Extremity Ankle Circles/Pumps: AROM;Both;5 reps Quad Sets: AROM;Both;10 reps Hip ABduction/ADduction: AROM;Both;10 reps    General Comments        Pertinent Vitals/Pain Pain Assessment: No/denies pain    Home Living                      Prior Function            PT Goals (current goals can now be found in the care plan section) Acute Rehab PT Goals Patient Stated Goal: return to home with help from friends/family Progress towards PT goals: Progressing toward goals    Frequency    7X/week      PT Plan Current plan remains appropriate  Co-evaluation              AM-PAC PT "6 Clicks" Mobility   Outcome Measure  Help needed turning from your back to your side while in a flat bed without using bedrails?: None Help needed moving from lying on your back to sitting on the side of a flat bed without using bedrails?: A Little Help needed moving to and from a bed to a chair (including a wheelchair)?: A Little Help needed standing up from a chair using your arms (e.g., wheelchair or bedside chair)?: A Little Help needed to walk in hospital room?: A Little Help needed climbing 3-5 steps with a railing? : A Little 6 Click Score: 19    End of Session Equipment Utilized During  Treatment: Right knee immobilizer Activity Tolerance: Patient tolerated treatment well Patient left: with call bell/phone within reach;in bed Nurse Communication: Mobility status PT Visit Diagnosis: Other abnormalities of gait and mobility (R26.89);Muscle weakness (generalized) (M62.81);Difficulty in walking, not elsewhere classified (R26.2)     Time: 0947-0962 PT Time Calculation (min) (ACUTE ONLY): 24 min  Charges:  $Gait Training: 8-22 mins $Therapeutic Exercise: 8-22 mins                    Ramond Dial 04/10/2019, 4:51 PM    Mee Hives, PT MS Acute Rehab Dept. Number: [place]

## 2019-04-10 NOTE — Discharge Summary (Signed)
Rachael Humphrey, is a 85 y.o. female  DOB 07/22/1930  MRN [id number].  Admission date:  04/08/2019  Admitting Physician  Epifanio Lesches, MD  Discharge Date:  04/10/2019   Primary MD  Idelle Crouch, MD  Recommendations for primary care physician for things to follow:   With PCP in 1 week Follow-up with [facility] in 4 to 5 weeks.  Admission Diagnosis  Closed nondisplaced comminuted fracture of right patella, initial encounter [S82.044A]   Discharge Diagnosis  Closed nondisplaced comminuted fracture of right patella, initial encounter [S82.044A]   Active Problems:   Closed patellar sleeve fracture of right knee      Past Medical History:  Diagnosis Date  . Cancer [facility]) 2005   colon cancer  . Colon cancer ([place])   . Complication of anesthesia   . Coronary artery disease   . Diabetes mellitus without complication ([place])   . GERD (gastroesophageal reflux disease)   . H/O hiatal hernia   . Hemorrhoids    internal-not a problem now.  . History of kidney stones    multiple  . History of shingles    04-18-14 3 weeks now -pain only left posterior back , no outbreaks  . Hypertension   . Hypothyroidism   . Myocardial infarction ([place])    2 months ago, stent placed [facility]  . PONV (postoperative nausea and vomiting)    no recent problems  . Stroke [facility])    TIA    Past Surgical History:  Procedure Laterality Date  . APPENDECTOMY    . AUGMENTATION MAMMAPLASTY Bilateral 1978  . BREAST BIOPSY Right    over 30 years ago negative  . BREAST BIOPSY Left    negative over 30 years ago  . CATARACT EXTRACTION, BILATERAL    . COLECTOMY     partial small and large instestine removed  . CORONARY ANGIOPLASTY WITH STENT PLACEMENT     02-23-14 coranary stent placed.  . CYSTOSCOPY WITH RETROGRADE PYELOGRAM, URETEROSCOPY AND  STENT PLACEMENT Bilateral 05/17/2014   Procedure: CYSTOSCOPY WITH RETROGRADE PYELOGRAM, URETEROSCOPY AND STENT PLACEMENT, BASKET STONE REMOVAL;  Surgeon: Alexis Frock, MD;  Location: [facility] ORS;  Service: Urology;  Laterality: Bilateral;  . KYPHOPLASTY N/A 11/25/2018   Procedure: KYPHOPLASTY T11, DIABETIC;  Surgeon: Hessie Knows, MD;  Location: [facility] ORS;  Service: Orthopedics;  Laterality: N/A;       History of present illness and  Hospital Course:     Kindly see H&P for history of present illness and admission details, please review complete Labs, Consult reports and Test reports for all details in brief  HPI  from the history and physical done on the day of admission 85 year old female patient admitted because of fall and right knee fracture.   Hospital Course  #1. right patella fracture, admitted to medical service, nonsurgical intervention recommended by orthopedic, knee immobilizer is placed to right lower extremity, advised to keep that all the time, patient can do partial weightbearing in knee immobilizer to right leg, patient wants to go home, will arrange home health physical therapy, discharged home with pain medicines, follow-up with orthopedic in 4 to 5 weeks.  Says 24-hour help at home for 4 to 6 weeks, I spoke with patient's niece Ms. Mariann Laster over the phone, patient sister can stay with her, will arrange home health physical therapy.  Patient labs are otherwise normal.  #2 history of CAD, PCI with stent placement in LAD in 2015, she is on aspirin, Plavix seen by Dr. Ubaldo Glassing, continue  that.  #3 essential hypertension: Controlled 4.  Hypothyroidism condition. 5.  Depression, last her husband recently she is more upset because she lost earrings that were given by him today. Discharge Condition: Full code   Follow UP  Follow-up Information    Idelle Crouch, MD. Schedule an appointment as soon as possible for a visit in 1 week(s).   Specialty: Internal Medicine Contact  information: [facility] [place] [address] [phone]        Lovell Sheehan, MD. Schedule an appointment as soon as possible for a visit in 4 week(s).   Specialty: Orthopedic Surgery Why: Please give phone number for patient to make the appointment Contact information: [place] [address] [phone]             Discharge Instructions  and  Discharge Medications      Allergies as of 04/10/2019      Reactions   Codeine Nausea And Vomiting   Darvon [propoxyphene] Other (See Comments)   Altered mental status    Demerol [meperidine] Other (See Comments)   Altered mental status    Gabapentin Other (See Comments)   Sulfa Antibiotics Other (See Comments)   Tramadol Other (See Comments)   "makes me crazy"  "makes me crazy"    Voltaren [diclofenac Sodium] Other (See Comments)   "Makes me crazy" "Makes me crazy"   Zinc    Hurting in lower stomach       Medication List    STOP taking these medications   acetaminophen 500 MG tablet Commonly known as: TYLENOL     TAKE these medications   aspirin 325 MG tablet Take 325 mg by mouth daily.   atorvastatin 20 MG tablet Commonly known as: LIPITOR Take 20 mg by mouth daily. Reported on 09/18/2015   CALCIUM 600 + D PO Take 1 tablet by mouth daily.   cetirizine 10 MG tablet Commonly known as: ZYRTEC Take 10 mg by mouth daily.   clopidogrel 75 MG tablet Commonly known as: PLAVIX Take 75 mg by mouth daily.   fluticasone 50 MCG/ACT nasal spray Commonly known as: FLONASE Place 2 sprays into both nostrils daily as needed for allergies.   HYDROcodone-acetaminophen 5-325 MG tablet Commonly known as: NORCO/VICODIN Take 1-2 tablets by mouth every 4 (four) hours as needed for moderate pain.   levothyroxine 25 MCG tablet Commonly known as: SYNTHROID Take 25 mcg by mouth daily before breakfast.   losartan 25 MG tablet Commonly known as: COZAAR Take 25 mg by mouth  daily.   meloxicam 7.5 MG tablet Commonly known as: MOBIC TAKE 1 TABLET (7.5 MG TOTAL) BY MOUTH DAILY.   metoprolol succinate 50 MG 24 hr tablet Commonly known as: TOPROL-XL Take 75 mg by mouth every morning.   montelukast 10 MG tablet Commonly known as: SINGULAIR Take 10 mg by mouth at bedtime.   multivitamin with minerals Tabs tablet Take 1 tablet by mouth daily.   mupirocin ointment 2 % Commonly known as: BACTROBAN Place 1 application into the nose 2 (two) times daily.   NIFEdipine 30 MG 24 hr tablet Commonly known as: PROCARDIA-XL/NIFEDICAL-XL Take 30 mg by mouth daily.   pantoprazole 40 MG tablet Commonly known as: PROTONIX Take 40 mg by mouth daily.   PRESERVISION AREDS 2 PO Take 1 tablet by mouth 2 (two) times daily.            Durable Medical Equipment  (From admission, onward)  Start     Ordered   04/09/19 1019  For home use only DME Walker rolling  Once    Question:  Patient needs a walker to treat with the following condition  Answer:  Fracture of right patella   04/09/19 1019            Diet and Activity recommendation: See Discharge Instructions above   Consults obtained -orthopedic  Major procedures and Radiology Reports - PLEASE review detailed and final reports for all details, in brief -      Dg Chest 1 View  Result Date: 04/08/2019 CLINICAL DATA:  Pre operative respiratory exam. Patella fracture. EXAM: CHEST  1 VIEW COMPARISON:  Chest x-ray dated 02/12/2017 FINDINGS: The heart size and pulmonary vascularity are normal. Lungs are clear. No acute bone abnormality. Old thoracic compression fractures treated with vertebroplasty. Hiatal hernia. IMPRESSION: No active disease. Electronically Signed   By: Lorriane Shire M.D.   On: 04/08/2019 18:24   Dg Knee Complete 4 Views Right  Result Date: 04/08/2019 CLINICAL DATA:  Anterior right knee pain post fall. EXAM: RIGHT KNEE - COMPLETE 4+ VIEW COMPARISON:  None. FINDINGS: There is a  comminuted predominantly transverse fracture of the patella. No other fractures are seen. Large hemorrhagic suprapatellar joint effusion. Anterior prepatellar soft tissue swelling. Vascular calcifications noted. IMPRESSION: 1. Comminuted predominantly transverse fracture of the patella. 2. Large hemorrhagic suprapatellar joint effusion. Electronically Signed   By: Fidela Salisbury M.D.   On: 04/08/2019 17:26    Micro Results     Recent Results (from the past 240 hour(s))  SARS Coronavirus 2 (CEPHEID - Performed in [place] hospital lab), Hosp Order     Status: None   Collection Time: 04/08/19  6:30 PM   Specimen: Nasopharyngeal Swab  Result Value Ref Range Status   SARS Coronavirus 2 NEGATIVE NEGATIVE Final    Comment: (NOTE) If result is NEGATIVE SARS-CoV-2 target nucleic acids are NOT DETECTED. The SARS-CoV-2 RNA is generally detectable in upper and lower  respiratory specimens during the acute phase of infection. The lowest  concentration of SARS-CoV-2 viral copies this assay can detect is 250  copies / mL. A negative result does not preclude SARS-CoV-2 infection  and should not be used as the sole basis for treatment or other  patient management decisions.  A negative result may occur with  improper specimen collection / handling, submission of specimen other  than nasopharyngeal swab, presence of viral mutation(s) within the  areas targeted by this assay, and inadequate number of viral copies  (<250 copies / mL). A negative result must be combined with clinical  observations, patient history, and epidemiological information. If result is POSITIVE SARS-CoV-2 target nucleic acids are DETECTED. The SARS-CoV-2 RNA is generally detectable in upper and lower  respiratory specimens dur ing the acute phase of infection.  Positive  results are indicative of active infection with SARS-CoV-2.  Clinical  correlation with patient history and other diagnostic information is  necessary  to determine patient infection status.  Positive results do  not rule out bacterial infection or co-infection with other viruses. If result is PRESUMPTIVE POSTIVE SARS-CoV-2 nucleic acids MAY BE PRESENT.   A presumptive positive result was obtained on the submitted specimen  and confirmed on repeat testing.  While 2019 novel coronavirus  (SARS-CoV-2) nucleic acids may be present in the submitted sample  additional confirmatory testing may be necessary for epidemiological  and / or clinical management purposes  to differentiate between  SARS-CoV-2 and  other Sarbecovirus currently known to infect humans.  If clinically indicated additional testing with an alternate test  methodology [phone]) is advised. The SARS-CoV-2 RNA is generally  detectable in upper and lower respiratory sp ecimens during the acute  phase of infection. The expected result is Negative. Fact Sheet for Patients:  StrictlyIdeas.no Fact Sheet for Healthcare Providers: BankingDealers.co.za This test is not yet approved or cleared by the [place] FDA and has been authorized for detection and/or diagnosis of SARS-CoV-2 by FDA under an Emergency Use Authorization (EUA).  This EUA will remain in effect (meaning this test can be used) for the duration of the COVID-19 declaration under Section 564(b)(1) of the Act, 21 U.S.C. section 360bbb-3(b)(1), unless the authorization is terminated or revoked sooner. Performed at [facility], [address] [place], [place] [address]   MRSA PCR Screening     Status: Abnormal   Collection Time: 04/08/19 11:26 PM   Specimen: Nasal Mucosa; Nasopharyngeal  Result Value Ref Range Status   MRSA by PCR POSITIVE (A) NEGATIVE Final    Comment:        The GeneXpert MRSA Assay (FDA approved for NASAL specimens only), is one component of a comprehensive MRSA colonization surveillance program. It is not intended to diagnose  MRSA infection nor to guide or monitor treatment for MRSA infections. CRITICAL RESULT CALLED TO, READ BACK BY AND VERIFIED WITH: OLIVA KIVERENGE @050  04/09/2019 TTG  Performed at [facility], [address] [place], [place] [address]        Today   Subjective:   Rachael Humphrey today has no headache,no chest abdominal pain,no new weakness tingling or numbness, feels much better wants to go home today.   Objective:   Blood pressure 126/79, pulse 67, temperature 98.8 F (37.1 C), resp. rate 16, height 5\' 1"  (1.549 m), weight 58 kg, SpO2 95 %.   Intake/Output Summary (Last 24 hours) at 04/10/2019 1300 Last data filed at 04/09/2019 1700 Gross per 24 hour  Intake 120 ml  Output -  Net 120 ml    Exam Awake Alert, Oriented x 3, No new F.N deficits, Normal affect [place].AT,PERRAL Supple Neck,No JVD, No cervical lymphadenopathy appriciated.  Symmetrical Chest wall movement, Good air movement bilaterally, CTAB RRR,No Gallops,Rubs or new Murmurs, No Parasternal Heave +ve B.Sounds, Abd Soft, Non tender, No organomegaly appriciated, No rebound -guarding or rigidity. Right knee immobilizer present, decreased swelling, rolling walker provided.  Data Review   CBC w Diff:  Lab Results  Component Value Date   WBC 9.6 04/09/2019   HGB 12.6 04/09/2019   HGB 15.3 04/01/2014   HCT 37.8 04/09/2019   HCT 45.7 04/01/2014   PLT 188 04/09/2019   PLT 185 04/01/2014   LYMPHOPCT 18 02/12/2017   LYMPHOPCT 8.8 04/01/2014   MONOPCT 7 02/12/2017   MONOPCT 4.6 04/01/2014   EOSPCT 2 02/12/2017   EOSPCT 0.4 04/01/2014   BASOPCT 0 02/12/2017   BASOPCT 0.7 04/01/2014    CMP:  Lab Results  Component Value Date   NA 138 04/09/2019   NA 136 04/01/2014   K 3.9 04/09/2019   K 4.1 04/01/2014   CL 106 04/09/2019   CL 103 04/01/2014   CO2 24 04/09/2019   CO2 20 (L) 04/01/2014   BUN 12 04/09/2019   BUN 10 04/01/2014   CREATININE 0.49 04/09/2019   CREATININE 0.85 04/01/2014   PROT 6.7  04/08/2019   PROT 8.0 04/01/2014   ALBUMIN 4.1 04/08/2019   ALBUMIN 4.4 04/01/2014  BILITOT 1.0 04/08/2019   BILITOT 0.6 04/01/2014   ALKPHOS 70 04/08/2019   ALKPHOS 70 04/01/2014   AST 27 04/08/2019   AST 31 04/01/2014   ALT 28 04/08/2019   ALT 30 04/01/2014  .   Total Time in preparing paper work, data evaluation and todays exam - 37 minutes  Epifanio Lesches M.D on 04/10/2019 at 1:00 PM    Note: This dictation was prepared with Dragon dictation along with smaller phrase technology. Any transcriptional errors that result from this process are unintentional.

## 2019-04-10 NOTE — Progress Notes (Signed)
  Subjective:  Patient reports pain as moderate.  Sitting up in chair.  Objective:   VITALS:   Vitals:   04/09/19 1057 04/09/19 1556 04/09/19 2345 04/10/19 0800  BP: (!) 149/89 138/88 (!) 143/80 126/79  Pulse: 73 70 68 67  Resp:  16 16 16   Temp:  97.7 F (36.5 C) 97.7 F (36.5 C) 98.8 F (37.1 C)  TempSrc:      SpO2:  99% 95% 95%  Weight:      Height:        PHYSICAL EXAM:  ABD soft Sensation intact distally No cellulitis present Compartment soft skin is clean, dry and intact, moderate swelling over the patella  LABS  No results found for this or any previous visit (from the past 24 hour(s)).  Dg Chest 1 View  Result Date: 04/08/2019 CLINICAL DATA:  Pre operative respiratory exam. Patella fracture. EXAM: CHEST  1 VIEW COMPARISON:  Chest x-ray dated 02/12/2017 FINDINGS: The heart size and pulmonary vascularity are normal. Lungs are clear. No acute bone abnormality. Old thoracic compression fractures treated with vertebroplasty. Hiatal hernia. IMPRESSION: No active disease. Electronically Signed   By: Lorriane Shire M.D.   On: 04/08/2019 18:24   Dg Knee Complete 4 Views Right  Result Date: 04/08/2019 CLINICAL DATA:  Anterior right knee pain post fall. EXAM: RIGHT KNEE - COMPLETE 4+ VIEW COMPARISON:  None. FINDINGS: There is a comminuted predominantly transverse fracture of the patella. No other fractures are seen. Large hemorrhagic suprapatellar joint effusion. Anterior prepatellar soft tissue swelling. Vascular calcifications noted. IMPRESSION: 1. Comminuted predominantly transverse fracture of the patella. 2. Large hemorrhagic suprapatellar joint effusion. Electronically Signed   By: Fidela Salisbury M.D.   On: 04/08/2019 17:26    Assessment/Plan:     Active Problems:   Closed patellar sleeve fracture of right knee   Up with therapy Discharge to SNF or to home with daily aid. She will require assistance for 4 to 6 weeks given the limited weightbearing, poor  endurance, generalized weakness, poor balance and she lives alone. Will see her in the office in 7 to 10 days. Please call with questions.  She may 50% weight bearing in the knee immobilizer only.   Lovell Sheehan , MD 04/10/2019, 10:08 AM

## 2019-09-15 ENCOUNTER — Other Ambulatory Visit: Payer: Self-pay | Admitting: Orthopedic Surgery

## 2019-09-15 DIAGNOSIS — M546 Pain in thoracic spine: Secondary | ICD-10-CM

## 2019-09-22 ENCOUNTER — Ambulatory Visit
Admission: RE | Admit: 2019-09-22 | Discharge: 2019-09-22 | Disposition: A | Discharge status: Home / Self Care | Payer: Medicare Other | Source: Ambulatory Visit | Attending: Orthopedic Surgery | Admitting: Orthopedic Surgery

## 2019-09-22 ENCOUNTER — Other Ambulatory Visit: Payer: Self-pay

## 2019-09-22 DIAGNOSIS — M546 Pain in thoracic spine: Secondary | ICD-10-CM | POA: Diagnosis present

## 2020-07-24 IMAGING — MR MR THORACIC SPINE W/O CM
5 of 6 series · 32 of 48 positions shown · non-contrast
Comparison: Thoracic MRI 11/19/2018

CLINICAL DATA: Previous kyphoplasty. Recent back injury with
central back pain and flank pain.

EXAM:
MRI THORACIC SPINE WITHOUT CONTRAST
TECHNIQUE: Multiplanar, multisequence MR imaging of the thoracic spine was
performed. No intravenous contrast was administered.

[Series 16: T1 · sagittal · 5.0mm · 1.88mm/px · 3 of 9 slices shown (1 of 2)]
[im 1/9]
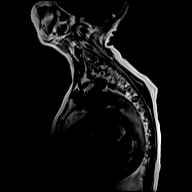
[im 5/9]
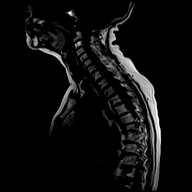
[im 9/9]
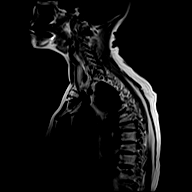

[Series 19: T2 · sagittal · 3.0mm · 1.06mm/px · 7 of 23 slices shown (1 of 2)]
[im 1/23]
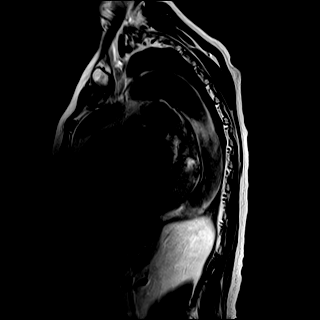
[im 4/23]
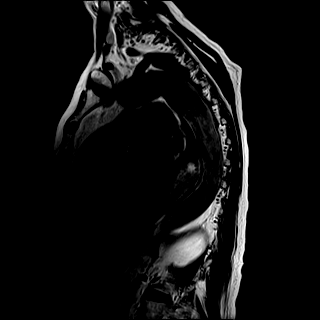
[im 8/23]
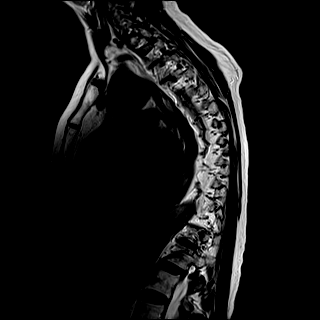
[im 12/23]
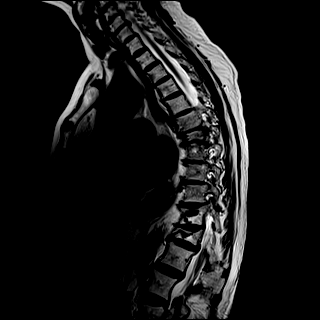
[im 15/23]
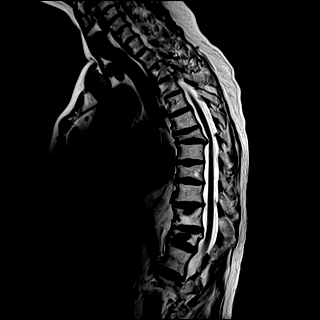
[im 19/23]
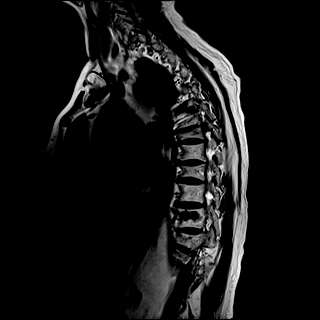
[im 23/23]
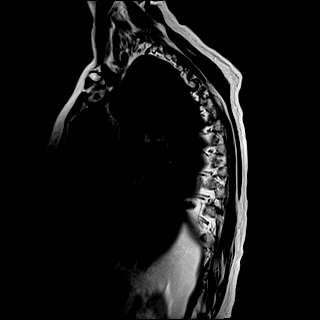

[Series 20: T1 · sagittal · 3.0mm · 1.06mm/px · 7 of 23 slices shown (2 of 2)]
[im 1/23]
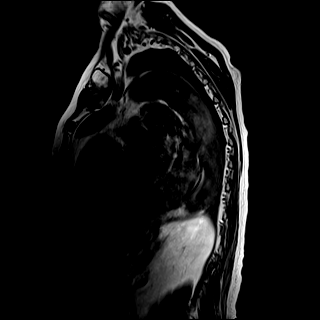
[im 4/23]
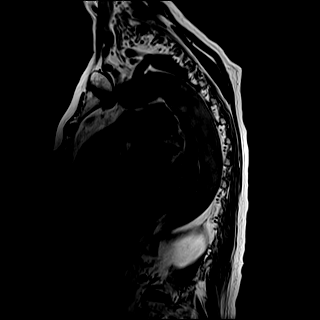
[im 8/23]
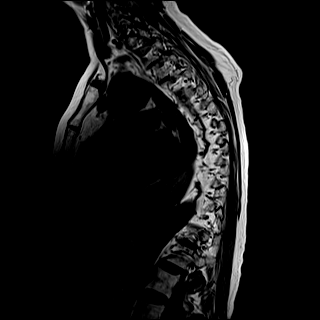
[im 12/23]
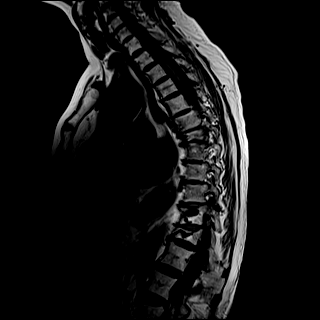
[im 15/23]
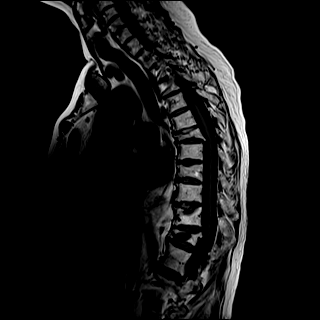
[im 19/23]
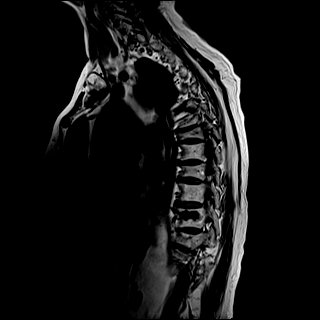
[im 23/23]
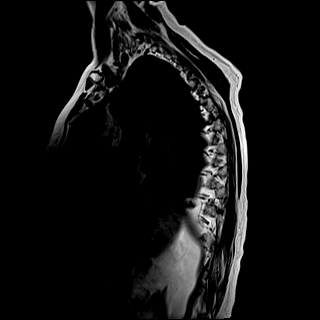

[Series 21: STIR · sagittal · 3.0mm · 0.53mm/px · 6 of 23 slices shown]
[im 1/23]
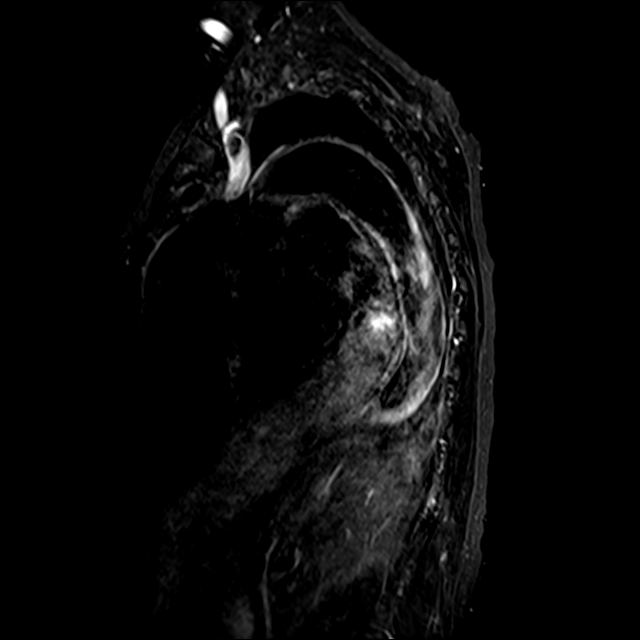
[im 4/23]
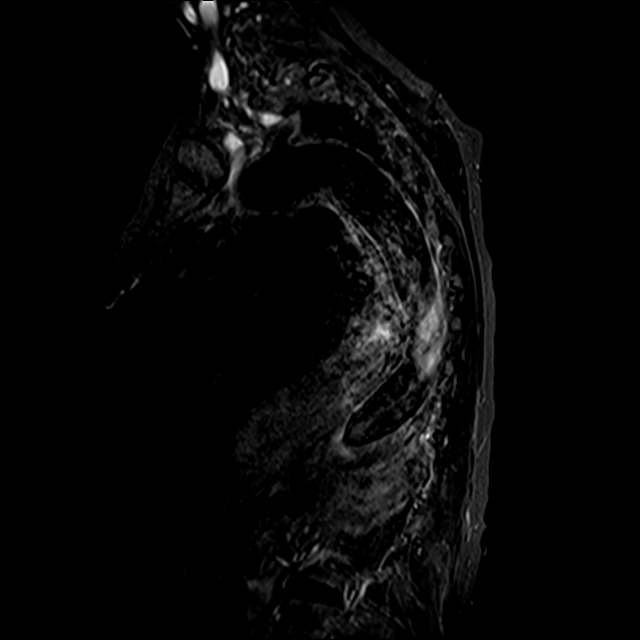
[im 8/23]
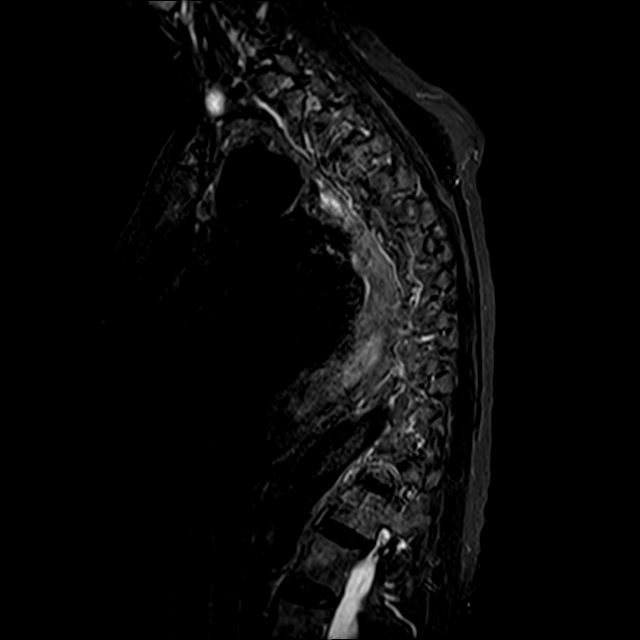
[im 12/23]
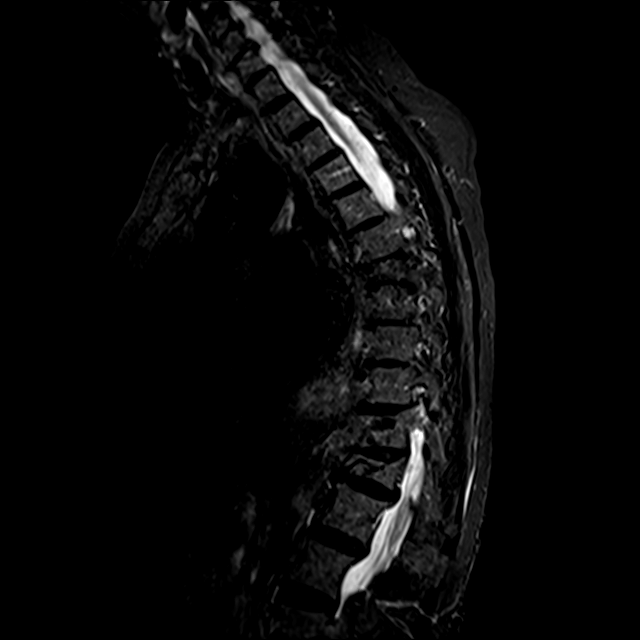
[im 15/23]
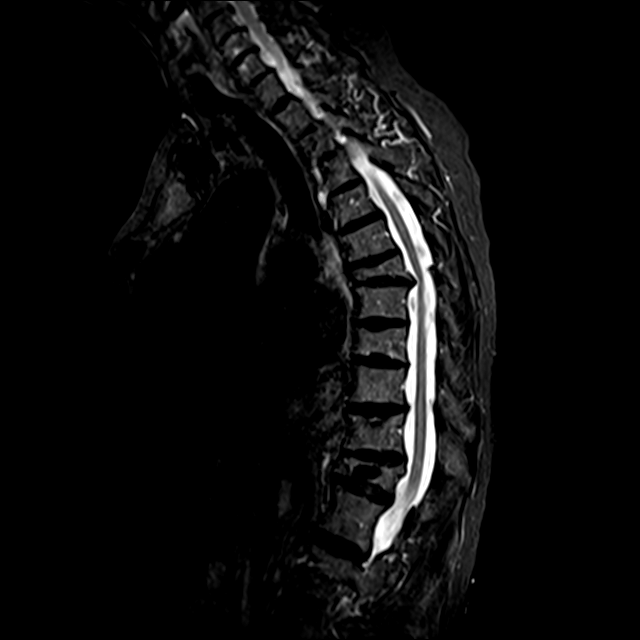
[im 19/23]
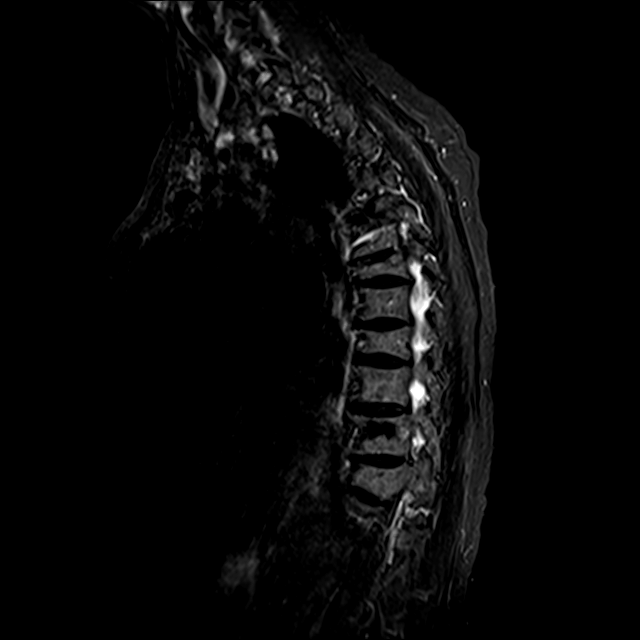

[Series 22: T2 · axial · 4.0mm · 0.59mm/px · z∈[-282,-131]mm · 9 of 39 slices shown (2 of 2)]
[im 1/39]
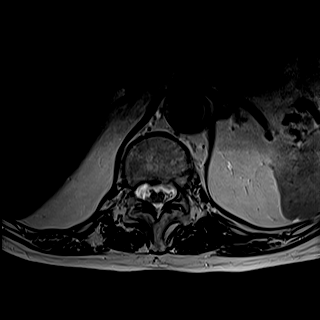
[im 7/39]
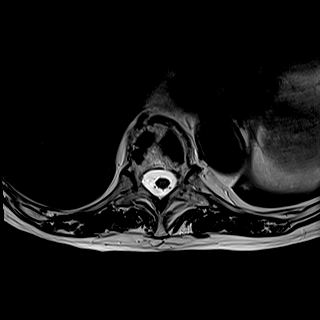
[im 11/39]
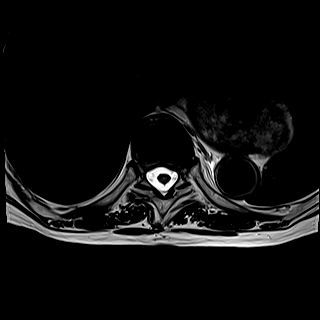
[im 18/39]
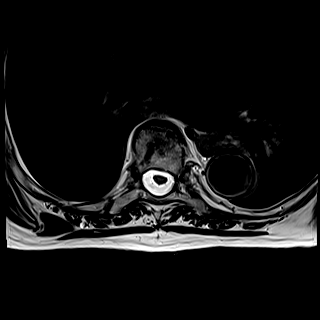
[im 21/39]
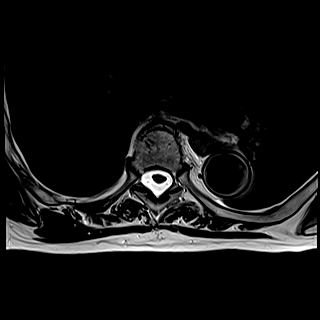
[im 28/39]
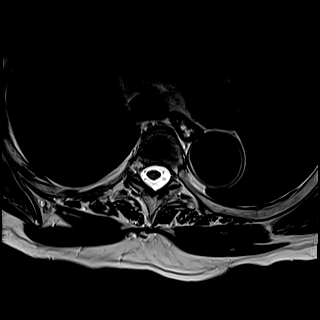
[im 32/39]
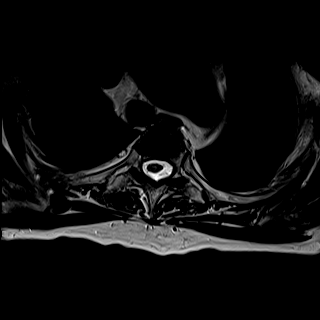
[im 35/39]
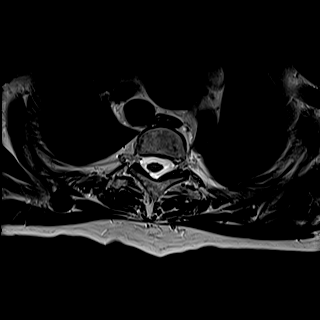
[im 39/39]
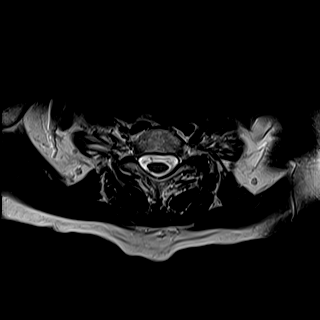

[32 of 48 positions shown; findings below may reference images not displayed]

FINDINGS: Alignment: Upper thoracic curvature convex to the left. Midthoracic
curvature convex to the right. Lumbar curvature convex to the left.

Vertebrae: Old healed compression fracture at T7 with loss of height
anteriorly of 70%. Old healed compression fracture at T9 with loss
of height centrally of 50%. Distant vertebral augmentation at T11-12
without residual edema. Old healed fracture at T12 with loss of
height of 50% and distant vertebral augmentation. No residual edema.
L1 and L2 are negative.

Cord:  No cord compression or primary cord lesion.

Paraspinal and other soft tissues: Negative

Disc levels:

No significant disc pathology at T6-7 or above. Mild non-compressive
bulges, most notable at T5-6. At T7, there is posterior bowing of
the posteroinferior margin of the vertebral body by 4 mm. This
indents the ventral subarachnoid space but does not compress the
cord. Ample subarachnoid spaces present dorsal to the cord. There
are mild non-compressive disc bulges at T9-10, T10-11, T11-12 and
T12-L1.
IMPRESSION: No acute injury.

Old compression deformities at T7, T9, T11 and T12 with augmentation
at T11 and T12. No recent fracture. No compressive canal or
foraminal stenosis.

## 2020-11-15 ENCOUNTER — Encounter (HOSPITAL_COMMUNITY): Payer: Self-pay | Admitting: Emergency Medicine

## 2020-11-15 ENCOUNTER — Emergency Department (HOSPITAL_COMMUNITY): Payer: Medicare Other

## 2020-11-15 ENCOUNTER — Other Ambulatory Visit: Payer: Self-pay

## 2020-11-15 ENCOUNTER — Emergency Department (HOSPITAL_COMMUNITY)
Admission: EM | Admit: 2020-11-15 | Discharge: 2020-11-15 | Disposition: A | Discharge status: Home / Self Care | Payer: Medicare Other | Attending: Emergency Medicine | Admitting: Emergency Medicine

## 2020-11-15 DIAGNOSIS — M79662 Pain in left lower leg: Secondary | ICD-10-CM | POA: Diagnosis not present

## 2020-11-15 DIAGNOSIS — I10 Essential (primary) hypertension: Secondary | ICD-10-CM | POA: Insufficient documentation

## 2020-11-15 DIAGNOSIS — W2201XA Walked into wall, initial encounter: Secondary | ICD-10-CM | POA: Diagnosis not present

## 2020-11-15 DIAGNOSIS — M25511 Pain in right shoulder: Secondary | ICD-10-CM | POA: Diagnosis present

## 2020-11-15 DIAGNOSIS — E039 Hypothyroidism, unspecified: Secondary | ICD-10-CM | POA: Insufficient documentation

## 2020-11-15 DIAGNOSIS — Z85038 Personal history of other malignant neoplasm of large intestine: Secondary | ICD-10-CM | POA: Diagnosis not present

## 2020-11-15 DIAGNOSIS — E119 Type 2 diabetes mellitus without complications: Secondary | ICD-10-CM | POA: Diagnosis not present

## 2020-11-15 DIAGNOSIS — Z79899 Other long term (current) drug therapy: Secondary | ICD-10-CM | POA: Insufficient documentation

## 2020-11-15 DIAGNOSIS — Z7982 Long term (current) use of aspirin: Secondary | ICD-10-CM | POA: Insufficient documentation

## 2020-11-15 DIAGNOSIS — Z8673 Personal history of transient ischemic attack (TIA), and cerebral infarction without residual deficits: Secondary | ICD-10-CM | POA: Diagnosis not present

## 2020-11-15 DIAGNOSIS — M545 Low back pain, unspecified: Secondary | ICD-10-CM | POA: Insufficient documentation

## 2020-11-15 DIAGNOSIS — W19XXXA Unspecified fall, initial encounter: Secondary | ICD-10-CM

## 2020-11-15 DIAGNOSIS — I2510 Atherosclerotic heart disease of native coronary artery without angina pectoris: Secondary | ICD-10-CM | POA: Insufficient documentation

## 2020-11-15 DIAGNOSIS — Y92230 Patient room in hospital as the place of occurrence of the external cause: Secondary | ICD-10-CM | POA: Insufficient documentation

## 2020-11-15 DIAGNOSIS — Z7902 Long term (current) use of antithrombotics/antiplatelets: Secondary | ICD-10-CM | POA: Diagnosis not present

## 2020-11-15 MED ORDER — ACETAMINOPHEN 325 MG PO TABS
650.0000 mg | ORAL_TABLET | Freq: Once | ORAL | Status: AC
  Administered 2020-11-15: 650 mg via ORAL
  Filled 2020-11-15: qty 2

## 2020-11-15 MED ORDER — LIDOCAINE 5 % EX PTCH: 1.0000 | MEDICATED_PATCH | CUTANEOUS | 0 refills | Status: DC

## 2020-11-15 MED ORDER — DICLOFENAC EPOLAMINE 1.3 % EX PTCH
1.0000 | MEDICATED_PATCH | Freq: Two times a day (BID) | CUTANEOUS | Status: DC
  Administered 2020-11-15: 1 via TRANSDERMAL
  Filled 2020-11-15: qty 1

## 2020-11-15 NOTE — Discharge Instructions (Addendum)
Yo may take 1000mg  Tylenol and 400mg  ibuprofen every 6hrs as needed for your back pain. Will also prescribe daily lidocaine patch. Please schedule follow up appointment with Orthopedic Clinic for your back pain.

## 2020-11-15 NOTE — ED Notes (Signed)
Patient provided with portable phone, speaking with sister at this time.

## 2020-11-15 NOTE — ED Triage Notes (Addendum)
Pt slipped and feel in a patients room she was visiting, no LOC. She reports straightening up the bed and slipped on the floor in her socks. She is flat lying on a stretcher, there is  lower back pain without radiation & right knee pain.. She denies hip pain,no shortening or rotation noted. Hx of blood thinner Plavix.

## 2020-11-15 NOTE — ED Provider Notes (Signed)
[facility] EMERGENCY DEPARTMENT Provider Note   CSN: [id number] Arrival date & time: 11/15/20  1520     History Chief Complaint  Patient presents with  . Fall    Rachael Humphrey is a 87 y.o. female w/ h/o CAD and previous MI, T2DM, GERD, HTN, HLD, previous TIA, and hypothyroidism who presents to the ED for fall. Patient visiting someone in the hospital when she slipped and fell in patient's room. She states she struck her right shoulder against the wall and landed on her buttocks. Denies LOC or prodromal symptoms. She reports pain to sacrum, R shoulder, and R lower leg. Denies headache, neck pain, chest pain, SOB, or abdominal pain. Currently on Plavix.  The history is provided by the patient and medical records.  Fall This is a new problem. The current episode started 1 to 2 hours ago. The problem occurs constantly. The problem has been resolved. Pertinent negatives include no chest pain, no abdominal pain, no headaches and no shortness of breath. Exacerbated by: sitting up. Nothing relieves the symptoms. She has tried nothing for the symptoms.       Past Medical History:  Diagnosis Date  . Cancer [facility]) 2005   colon cancer  . Colon cancer ([place])   . Complication of anesthesia   . Coronary artery disease   . Diabetes mellitus without complication ([place])   . GERD (gastroesophageal reflux disease)   . H/O hiatal hernia   . Hemorrhoids    internal-not a problem now.  . History of kidney stones    multiple  . History of shingles    04-18-14 3 weeks now -pain only left posterior back , no outbreaks  . Hypertension   . Hypothyroidism   . Myocardial infarction ([place])    2 months ago, stent placed [facility]  . PONV (postoperative nausea and vomiting)    no recent problems  . Stroke [facility])    TIA    Patient Active Problem List   Diagnosis Date Noted  . Closed patellar sleeve fracture of right knee 04/08/2019  . Fall 02/12/2017  . Acquired hypothyroidism 10/06/2016   . Arm fracture 10/06/2016  . Benign neoplasm of colon 10/06/2016  . Cervical adenopathy 10/06/2016  . Benign essential hypertension 10/06/2016  . GERD (gastroesophageal reflux disease) 10/06/2016  . Hiatal hernia 10/06/2016  . History of abnormal cervical Pap smear 10/06/2016  . History of TIA (transient ischemic attack) 10/06/2016  . Hyperlipidemia, unspecified 10/06/2016  . Nephrolithiasis 10/06/2016  . Senile osteoporosis 10/06/2016  . Type 2 diabetes mellitus without complication ([place]) 11/94/1740  . Rupture of implant of right breast 09/18/2015  . Chronic back pain 03/08/2014  . Compression fracture 03/08/2014  . Coronary artery disease 03/03/2014  . Wedge compression fracture of T7 vertebra ([employer]) 02/22/2014    Past Surgical History:  Procedure Laterality Date  . APPENDECTOMY    . AUGMENTATION MAMMAPLASTY Bilateral 1978  . BREAST BIOPSY Right    over 30 years ago negative  . BREAST BIOPSY Left    negative over 30 years ago  . CATARACT EXTRACTION, BILATERAL    . COLECTOMY     partial small and large instestine removed  . CORONARY ANGIOPLASTY WITH STENT PLACEMENT     02-23-14 coranary stent placed.  . CYSTOSCOPY WITH RETROGRADE PYELOGRAM, URETEROSCOPY AND STENT PLACEMENT Bilateral 05/17/2014   Procedure: CYSTOSCOPY WITH RETROGRADE PYELOGRAM, URETEROSCOPY AND STENT PLACEMENT, BASKET STONE REMOVAL;  Surgeon: Alexis Frock, MD;  Location: [facility] ORS;  Service: Urology;  Laterality:  Bilateral;  . KYPHOPLASTY N/A 11/25/2018   Procedure: KYPHOPLASTY T11, DIABETIC;  Surgeon: Hessie Knows, MD;  Location: [facility] ORS;  Service: Orthopedics;  Laterality: N/A;     OB History    Gravida  1   Para  1   Term      Preterm      AB      Living  0     SAB      IAB      Ectopic      Multiple      Live Births           Obstetric Comments  1st Menstrual Cycle:  17  1st Pregnancy:  39         Family History  Problem Relation Age of Onset  . Stroke Mother   . Breast  cancer Sister 50    Social History   Tobacco Use  . Smoking status: Never Smoker  . Smokeless tobacco: Never Used  Vaping Use  . Vaping Use: Never used  Substance Use Topics  . Alcohol use: No  . Drug use: No    Home Medications Prior to Admission medications   Medication Sig Start Date End Date Taking? Authorizing Provider  lidocaine (LIDODERM) 5 % Place 1 patch onto the skin daily. Remove & Discard patch within 12 hours or as directed by MD 11/15/20  Yes Christy Gentles, MD  aspirin 325 MG tablet Take 325 mg by mouth daily.    [provider]  atorvastatin (LIPITOR) 20 MG tablet Take 20 mg by mouth daily. Reported on 09/18/2015    [provider]  Calcium Carb-Cholecalciferol (CALCIUM 600 + D PO) Take 1 tablet by mouth daily.    [provider]  cetirizine (ZYRTEC) 10 MG tablet Take 10 mg by mouth daily. 07/18/16   [provider]  clopidogrel (PLAVIX) 75 MG tablet Take 75 mg by mouth daily.  11/12/15   [provider]  fluticasone (FLONASE) 50 MCG/ACT nasal spray Place 2 sprays into both nostrils daily as needed for allergies.  11/12/15   [provider]  HYDROcodone-acetaminophen (NORCO/VICODIN) 5-325 MG tablet Take 1-2 tablets by mouth every 4 (four) hours as needed for moderate pain. 04/10/19   Epifanio Lesches, MD  levothyroxine (SYNTHROID, LEVOTHROID) 25 MCG tablet Take 25 mcg by mouth daily before breakfast.  12/10/16   [provider]  losartan (COZAAR) 25 MG tablet Take 25 mg by mouth daily.  08/13/16   [provider]  meloxicam (MOBIC) 7.5 MG tablet TAKE 1 TABLET (7.5 MG TOTAL) BY MOUTH DAILY. Patient not taking: Reported on 02/12/2017 12/01/16   Gardiner Barefoot, DPM  metoprolol succinate (TOPROL-XL) 50 MG 24 hr tablet Take 75 mg by mouth every morning.     [provider]  montelukast (SINGULAIR) 10 MG tablet Take 10 mg by mouth at bedtime.  07/18/16   [provider]  Multiple Vitamin  (MULTIVITAMIN WITH MINERALS) TABS tablet Take 1 tablet by mouth daily.    [provider]  Multiple Vitamins-Minerals (PRESERVISION AREDS 2 PO) Take 1 tablet by mouth 2 (two) times daily.     [provider]  mupirocin ointment (BACTROBAN) 2 % Place 1 application into the nose 2 (two) times daily. 04/10/19   Epifanio Lesches, MD  NIFEdipine (PROCARDIA-XL/NIFEDICAL-XL) 30 MG 24 hr tablet Take 30 mg by mouth daily. 11/06/18   [provider]  pantoprazole (PROTONIX) 40 MG tablet Take 40 mg by mouth daily.  08/13/16  [provider]    Allergies    Codeine, Darvon [propoxyphene], Demerol [meperidine], Gabapentin, Sulfa antibiotics, Tramadol, Voltaren [diclofenac sodium], and Zinc  Review of Systems   Review of Systems  Constitutional: Negative for chills and fever.  HENT: Negative for ear pain and sore throat.   Eyes: Negative for pain and visual disturbance.  Respiratory: Negative for cough and shortness of breath.   Cardiovascular: Negative for chest pain and palpitations.  Gastrointestinal: Negative for abdominal pain and vomiting.  Genitourinary: Negative for dysuria and hematuria.  Musculoskeletal: Positive for back pain. Negative for arthralgias.  Skin: Negative for color change and rash.  Neurological: Negative for seizures, syncope and headaches.  All other systems reviewed and are negative.   Physical Exam Updated Vital Signs BP (!) 152/90   Pulse 75   Temp 98.3 F (36.8 C) (Oral)   Resp 18   SpO2 98%   Physical Exam Vitals and nursing note reviewed.  Constitutional:      General: She is awake. She is not in acute distress.    Appearance: Normal appearance. She is well-developed, well-groomed, normal weight and well-nourished. She is not ill-appearing.  HENT:     Head: Normocephalic and atraumatic.     Right Ear: External ear normal.     Left Ear: External ear normal.     Nose: Nose normal.     Mouth/Throat:     Mouth: Mucous  membranes are moist.     Pharynx: Oropharynx is clear. No oropharyngeal exudate or posterior oropharyngeal erythema.  Eyes:     General: No scleral icterus.       Right eye: No discharge.        Left eye: No discharge.     Extraocular Movements: Extraocular movements intact.     Conjunctiva/sclera: Conjunctivae normal.     Pupils: Pupils are equal, round, and reactive to light.  Cardiovascular:     Rate and Rhythm: Normal rate and regular rhythm.     Pulses: Normal pulses.     Heart sounds: Normal heart sounds. No murmur heard.   Pulmonary:     Effort: Pulmonary effort is normal. No respiratory distress.     Breath sounds: Normal breath sounds. No wheezing, rhonchi or rales.     Comments: Chest and clavicles non-tender and stable to compression with no overlying skin changes. Chest:     Chest wall: No tenderness.  Abdominal:     General: Abdomen is flat. There is no distension.     Palpations: Abdomen is soft.     Tenderness: There is no abdominal tenderness. There is no guarding or rebound.  Musculoskeletal:        General: Tenderness present. No edema.     Cervical back: Neck supple. No signs of trauma. No pain with movement, spinous process tenderness or muscular tenderness.     Right lower leg: No edema.     Left lower leg: No edema.     Comments: R paralumbar muscle tenderness without midline spinous process tenderness, step offs, or deformity. No C/T/L midline spinous process tenderness.  Tenderness over proximal L lower leg without deformity or overlying skin changes. NVI distally.  Skin:    General: Skin is warm and dry.  Neurological:     Mental Status: She is alert.  Psychiatric:        Mood and Affect: Mood and affect normal.        Behavior: Behavior is cooperative.     ED Results / Procedures /  Treatments   Labs (all labs ordered are listed, but only abnormal results are displayed) Labs Reviewed - No data to display  EKG None  Radiology DG Lumbar Spine  Complete  Result Date: 11/15/2020 CLINICAL DATA:  Low back pain fall EXAM: LUMBAR SPINE - COMPLETE 4+ VIEW COMPARISON:  01/25/2014 FINDINGS: Levoscoliosis. Diffuse aortic atherosclerosis. Treated compression deformity at T11 and T12. Possible subtle deformity superior endplate L4. Advanced degenerative changes at L5-S1. Facet degenerative changes of the lower lumbar spine. IMPRESSION: 1. Possible subtle superior endplate deformity at L4. 2. Treated compression fractures at T11 and T12. Electronically Signed   By: Donavan Foil M.D.   On: 11/15/2020 17:47   DG Knee 2 Views Right  Result Date: 11/15/2020 CLINICAL DATA:  Fall with knee pain EXAM: RIGHT KNEE - 1-2 VIEW COMPARISON:  04/08/2019 FINDINGS: Old fracture deformity of the patella. No acute displaced fracture or malalignment. No significant knee effusion. Vascular calcifications. IMPRESSION: Old fracture deformity of the patella. No acute osseous abnormality. Electronically Signed   By: Donavan Foil M.D.   On: 11/15/2020 17:44   DG Tibia/Fibula Right  Result Date: 11/15/2020 CLINICAL DATA:  Proximal right lower leg pain EXAM: RIGHT TIBIA AND FIBULA - 2 VIEW COMPARISON:  11/15/2020 COVID 04/08/2019 FINDINGS: Frontal and lateral views of the right tibia and fibula are obtained. There are no acute displaced fractures. There is mild osteoarthritis of the right knee and ankle. Alignment is anatomic. Soft tissues are unremarkable. IMPRESSION: 1. Osteoarthritis.  No acute fracture. Electronically Signed   By: Randa Ngo M.D.   On: 11/15/2020 19:48   DG Hip Unilat With Pelvis 2-3 Views Left  Result Date: 11/15/2020 CLINICAL DATA:  Status post fall. EXAM: DG HIP (WITH OR WITHOUT PELVIS) 2-3V LEFT COMPARISON:  None. FINDINGS: There is no evidence of an acute hip fracture or dislocation. Moderate severity degenerative changes are seen within both hips, in the form of joint space narrowing and acetabular sclerosis. Additional degenerative changes are noted  throughout the visualized portion of the lumbar spine. Radiopaque surgical sutures and surgical clips are seen overlying the mid to lower right abdomen. IMPRESSION: Moderate severity degenerative changes without evidence of an acute osseous abnormality. Electronically Signed   By: Virgina Norfolk M.D.   On: 11/15/2020 17:39    Procedures Procedures  Medications Ordered in ED Medications  acetaminophen (TYLENOL) tablet 650 mg (650 mg Oral Given 11/15/20 1800)    ED Course  I have reviewed the triage vital signs and the nursing notes.  Pertinent labs & imaging results that were available during my care of the patient were reviewed by me and considered in my medical decision making (see chart for details).    MDM Rules/Calculators/A&P                          Patient is a 27yoF with history and physical as described above who presents to the ED for mechanical GLF. VS reassuring and HDS. Patient resting comfortably and in no acute distress. Initial workup started in triage and included plain films of lumbar spine, R knee, and hip. Additional workup includes plain films of RLE. Initial treatment includes Tylenol and lidocaine patch.  Plain films reassuring for acute fracture. Possible superior endplate deformity at L4 with no corresponding tenderness on exam. Doubt acute fracture at this time. Overall traumatic workup reassuring. Doubt ICH, spinal injury, intrathoracic trauma, intraabdominal trauma, neurovascular injury, fracture, or dislocation. Suspect back pain likely 2/2 acute  paralumbar muscle strain. Recommend Tylenol and ibuprofen for pain. Will prescribe for lidocaine patch. Recommend close PCP follow up as well as follow up with Neurosurgery if back pain persists for re-evaluation. Ambulating in ED.  Strict return precautions provided and discussed. Questions and concerns addressed. Patient verbalized understanding and amenable with discharge plan. Discharged in stable condition.  Final  Clinical Impression(s) / ED Diagnoses Final diagnoses:  Fall, initial encounter  Acute midline low back pain without sciatica    Rx / DC Orders ED Discharge Orders         Ordered    lidocaine (LIDODERM) 5 %  Every 24 hours        11/15/20 2027           Christy Gentles, MD 11/16/20 0221    Lucrezia Starch, MD 11/17/20 [phone]

## 2020-11-15 NOTE — ED Notes (Signed)
Called patient's nephew, he will come pick up patient.

## 2020-11-15 NOTE — ED Notes (Signed)
Pt assisted with bed pan

## 2020-11-19 ENCOUNTER — Emergency Department
Admission: EM | Admit: 2020-11-19 | Discharge: 2020-11-19 | Disposition: A | Discharge status: Home / Self Care | Payer: Medicare Other | Attending: Emergency Medicine | Admitting: Emergency Medicine

## 2020-11-19 ENCOUNTER — Emergency Department: Payer: Medicare Other

## 2020-11-19 ENCOUNTER — Other Ambulatory Visit: Payer: Self-pay

## 2020-11-19 ENCOUNTER — Encounter: Payer: Self-pay | Admitting: Emergency Medicine

## 2020-11-19 DIAGNOSIS — W19XXXA Unspecified fall, initial encounter: Secondary | ICD-10-CM | POA: Insufficient documentation

## 2020-11-19 DIAGNOSIS — E039 Hypothyroidism, unspecified: Secondary | ICD-10-CM | POA: Insufficient documentation

## 2020-11-19 DIAGNOSIS — I119 Hypertensive heart disease without heart failure: Secondary | ICD-10-CM | POA: Insufficient documentation

## 2020-11-19 DIAGNOSIS — Z85038 Personal history of other malignant neoplasm of large intestine: Secondary | ICD-10-CM | POA: Diagnosis not present

## 2020-11-19 DIAGNOSIS — I2510 Atherosclerotic heart disease of native coronary artery without angina pectoris: Secondary | ICD-10-CM | POA: Diagnosis not present

## 2020-11-19 DIAGNOSIS — Z79899 Other long term (current) drug therapy: Secondary | ICD-10-CM | POA: Insufficient documentation

## 2020-11-19 DIAGNOSIS — Z7982 Long term (current) use of aspirin: Secondary | ICD-10-CM | POA: Diagnosis not present

## 2020-11-19 DIAGNOSIS — E119 Type 2 diabetes mellitus without complications: Secondary | ICD-10-CM | POA: Insufficient documentation

## 2020-11-19 DIAGNOSIS — Y92009 Unspecified place in unspecified non-institutional (private) residence as the place of occurrence of the external cause: Secondary | ICD-10-CM | POA: Insufficient documentation

## 2020-11-19 DIAGNOSIS — M25551 Pain in right hip: Secondary | ICD-10-CM | POA: Diagnosis not present

## 2020-11-19 DIAGNOSIS — S0990XA Unspecified injury of head, initial encounter: Secondary | ICD-10-CM | POA: Diagnosis not present

## 2020-11-19 DIAGNOSIS — M79651 Pain in right thigh: Secondary | ICD-10-CM | POA: Diagnosis not present

## 2020-11-19 LAB — CBC WITH DIFFERENTIAL/PLATELET
Abs Immature Granulocytes: 0.02 10*3/uL (ref 0.00–0.07)
Basophils Absolute: 0 10*3/uL (ref 0.0–0.1)
Basophils Relative: 1 %
Eosinophils Absolute: 0 10*3/uL (ref 0.0–0.5)
Eosinophils Relative: 0 %
HCT: 43 % (ref 36.0–46.0)
Hemoglobin: 14.8 g/dL (ref 12.0–15.0)
Immature Granulocytes: 0 %
Lymphocytes Relative: 26 %
Lymphs Abs: 2.3 10*3/uL (ref 0.7–4.0)
MCH: 31.3 pg (ref 26.0–34.0)
MCHC: 34.4 g/dL (ref 30.0–36.0)
MCV: 90.9 fL (ref 80.0–100.0)
Monocytes Absolute: 0.8 10*3/uL (ref 0.1–1.0)
Monocytes Relative: 9 %
Neutro Abs: 5.5 10*3/uL (ref 1.7–7.7)
Neutrophils Relative %: 64 %
Platelets: 165 10*3/uL (ref 150–400)
RBC: 4.73 MIL/uL (ref 3.87–5.11)
RDW: 13.2 % (ref 11.5–15.5)
WBC: 8.7 10*3/uL (ref 4.0–10.5)
nRBC: 0 % (ref 0.0–0.2)

## 2020-11-19 LAB — BASIC METABOLIC PANEL
Anion gap: 9 (ref 5–15)
BUN: 16 mg/dL (ref 8–23)
CO2: 21 mmol/L — ABNORMAL LOW (ref 22–32)
Calcium: 9 mg/dL (ref 8.9–10.3)
Chloride: 105 mmol/L (ref 98–111)
Creatinine, Ser: 0.52 mg/dL (ref 0.44–1.00)
GFR, Estimated: >60 mL/min (ref >60)
Glucose, Bld: 185 mg/dL — ABNORMAL HIGH (ref 70–99)
Potassium: 3.3 mmol/L — ABNORMAL LOW (ref 3.5–5.1)
Sodium: 135 mmol/L (ref 135–145)

## 2020-11-19 MED ORDER — LORAZEPAM 1 MG PO TABS
1.0000 mg | ORAL_TABLET | Freq: Once | ORAL | Status: AC
  Administered 2020-11-19: 1 mg via ORAL
  Filled 2020-11-19: qty 1

## 2020-11-19 MED ORDER — TRAMADOL HCL 50 MG PO TABS
50.0000 mg | ORAL_TABLET | Freq: Once | ORAL | Status: AC
  Administered 2020-11-19: 50 mg via ORAL
  Filled 2020-11-19: qty 1

## 2020-11-19 NOTE — ED Triage Notes (Addendum)
Pt via EMS from home. Pt had a mechanical fall last Thursday. Pt was seen as [facility] the same day and was d/c'ed. Pt states she has been taking the pain medication and muscle relaxers prescribed with no relief. Pt states she hasn't been able to stand and walk since then. All X-Rays at [facility] were negative. Pt c/o R sided pain including hip, pelvis, and leg. Pt is on Plavix. Pt states she did hit her head on the initial fall but they did not do a head CT. Pt is A&Ox4 and NAD.

## 2020-11-19 NOTE — ED Notes (Signed)
Reinforced d/c plan/instructions verbally with patient and family member and provided written instructions-- pt/family acknowledged verbal understanding-- all questions, concerns addressed prior to d/c home-- assisted pt into w/c and then to car -- step stool provided for patient to sit in front seat of SUV - pt was able to bear weight long enough to step onto step stool and vehicle ledge

## 2020-11-19 NOTE — ED Notes (Signed)
Pt awake and alert; GCS 15.  RR even and unlabored on RA with symmetrical rise and fall of chest.  Per provider pt requesting to ambulate- when this nurse entered room and spoke with pt it was reported that pt uses walker and/or cane at home at baseline.  Pt and daughter, who is at bedside, report that pt has not ambulated at all in last two days d/t severe pain.  Pt reports ongoing R groin,hip, lower back pain and small healing bruise noted to R hip.  When this nurse attempted to assist pt up pt became anxious with facial grimacing and reports "I can't I can't" - pt states pain worse with attempting to sit up.  Will notify provider.

## 2020-11-19 NOTE — ED Notes (Addendum)
Pt reporting pain "a little better" since PO Ativan received -- attempted to sit patient up to see if she would be able to ambulate and pt unable to sit up due to severe cramping.  Pt reporting needing to void - assisted to bedpan and after using bedpan pt noted that she was able to tolerate turning side to side better - stated she wanted to try again to ambulate.  With some difficulty pt able to transfer from lying to sitting -- with positional changes pt reporting dizziness; explained it could be related to Ativan -- after a few minutes pt reporting wanting to try to stand from sitting position -- pt able to stand with walker and one person moderate assist but states severe cramping returned and unable to take any steps -- pt assisted back to sitting then lying position.  States pain at rest 4/10 however with movement increases to 6-7/10.

## 2020-11-19 NOTE — ED Provider Notes (Signed)
[facility] Emergency Department Provider Note  ____________________________________________   I have reviewed the triage vital signs and the nursing notes.   HISTORY  Chief Complaint Fall   History limited by: Not Limited   HPI Rachael Humphrey is a 87 y.o. female who presents to the emergency department today because of concerns for continued right hip pain.  Patient had a fall 4 days ago.  She states that she fell into the corner of the room.  Since then she has been having pain in her right hip.  She was evaluated emergency department when this happened with negative x-rays.  Since going home she has been trying Tylenol and muscle relaxers without any significant relief.  She states any movement and trying to get up because of increasing pain.  Patient states that the pain starts in her right buttocks and then will go around to the right lateral thigh.  She denies any numbness in the foot.  Denies any change in bladder or bowel habits.    Records reviewed. Per medical record review patient has a history of recent er evaluation for fall with negative imaging.   Past Medical History:  Diagnosis Date  . Cancer [facility]) 2005   colon cancer  . Colon cancer ([place])   . Complication of anesthesia   . Coronary artery disease   . Diabetes mellitus without complication ([place])   . GERD (gastroesophageal reflux disease)   . H/O hiatal hernia   . Hemorrhoids    internal-not a problem now.  . History of kidney stones    multiple  . History of shingles    04-18-14 3 weeks now -pain only left posterior back , no outbreaks  . Hypertension   . Hypothyroidism   . Myocardial infarction ([place])    2 months ago, stent placed [facility]  . PONV (postoperative nausea and vomiting)    no recent problems  . Stroke [facility])    TIA    Patient Active Problem List   Diagnosis Date Noted  . Closed patellar sleeve fracture of right knee 04/08/2019  . Fall 02/12/2017  . Acquired  hypothyroidism 10/06/2016  . Arm fracture 10/06/2016  . Benign neoplasm of colon 10/06/2016  . Cervical adenopathy 10/06/2016  . Benign essential hypertension 10/06/2016  . GERD (gastroesophageal reflux disease) 10/06/2016  . Hiatal hernia 10/06/2016  . History of abnormal cervical Pap smear 10/06/2016  . History of TIA (transient ischemic attack) 10/06/2016  . Hyperlipidemia, unspecified 10/06/2016  . Nephrolithiasis 10/06/2016  . Senile osteoporosis 10/06/2016  . Type 2 diabetes mellitus without complication ([place]) 95/28/4132  . Rupture of implant of right breast 09/18/2015  . Chronic back pain 03/08/2014  . Compression fracture 03/08/2014  . Coronary artery disease 03/03/2014  . Wedge compression fracture of T7 vertebra ([place]) 02/22/2014    Past Surgical History:  Procedure Laterality Date  . APPENDECTOMY    . AUGMENTATION MAMMAPLASTY Bilateral 1978  . BREAST BIOPSY Right    over 30 years ago negative  . BREAST BIOPSY Left    negative over 30 years ago  . CATARACT EXTRACTION, BILATERAL    . COLECTOMY     partial small and large instestine removed  . CORONARY ANGIOPLASTY WITH STENT PLACEMENT     02-23-14 coranary stent placed.  . CYSTOSCOPY WITH RETROGRADE PYELOGRAM, URETEROSCOPY AND STENT PLACEMENT Bilateral 05/17/2014   Procedure: CYSTOSCOPY WITH RETROGRADE PYELOGRAM, URETEROSCOPY AND STENT PLACEMENT, BASKET STONE REMOVAL;  Surgeon: Alexis Frock, MD;  Location: [facility] ORS;  Service:  Urology;  Laterality: Bilateral;  . KYPHOPLASTY N/A 11/25/2018   Procedure: KYPHOPLASTY T11, DIABETIC;  Surgeon: Hessie Knows, MD;  Location: [facility] ORS;  Service: Orthopedics;  Laterality: N/A;    Prior to Admission medications   Medication Sig Start Date End Date Taking? Authorizing Provider  aspirin 325 MG tablet Take 325 mg by mouth daily.    [provider]  atorvastatin (LIPITOR) 20 MG tablet Take 20 mg by mouth daily. Reported on 09/18/2015    [provider]  Calcium  Carb-Cholecalciferol (CALCIUM 600 + D PO) Take 1 tablet by mouth daily.    [provider]  cetirizine (ZYRTEC) 10 MG tablet Take 10 mg by mouth daily. 07/18/16   [provider]  clopidogrel (PLAVIX) 75 MG tablet Take 75 mg by mouth daily.  11/12/15   [provider]  fluticasone (FLONASE) 50 MCG/ACT nasal spray Place 2 sprays into both nostrils daily as needed for allergies.  11/12/15   [provider]  HYDROcodone-acetaminophen (NORCO/VICODIN) 5-325 MG tablet Take 1-2 tablets by mouth every 4 (four) hours as needed for moderate pain. 04/10/19   Epifanio Lesches, MD  levothyroxine (SYNTHROID, LEVOTHROID) 25 MCG tablet Take 25 mcg by mouth daily before breakfast.  12/10/16   [provider]  lidocaine (LIDODERM) 5 % Place 1 patch onto the skin daily. Remove & Discard patch within 12 hours or as directed by MD 11/15/20   Christy Gentles, MD  losartan (COZAAR) 25 MG tablet Take 25 mg by mouth daily.  08/13/16   [provider]  meloxicam (MOBIC) 7.5 MG tablet TAKE 1 TABLET (7.5 MG TOTAL) BY MOUTH DAILY. Patient not taking: Reported on 02/12/2017 12/01/16   Gardiner Barefoot, DPM  metoprolol succinate (TOPROL-XL) 50 MG 24 hr tablet Take 75 mg by mouth every morning.     [provider]  montelukast (SINGULAIR) 10 MG tablet Take 10 mg by mouth at bedtime.  07/18/16   [provider]  Multiple Vitamin (MULTIVITAMIN WITH MINERALS) TABS tablet Take 1 tablet by mouth daily.    [provider]  Multiple Vitamins-Minerals (PRESERVISION AREDS 2 PO) Take 1 tablet by mouth 2 (two) times daily.     [provider]  mupirocin ointment (BACTROBAN) 2 % Place 1 application into the nose 2 (two) times daily. 04/10/19   Epifanio Lesches, MD  NIFEdipine (PROCARDIA-XL/NIFEDICAL-XL) 30 MG 24 hr tablet Take 30 mg by mouth daily. 11/06/18   [provider]  pantoprazole (PROTONIX) 40 MG tablet Take 40 mg by mouth daily.  08/13/16    [provider]    Allergies Codeine, Darvon [propoxyphene], Demerol [meperidine], Gabapentin, Sulfa antibiotics, Tramadol, Voltaren [diclofenac sodium], and Zinc  Family History  Problem Relation Age of Onset  . Stroke Mother   . Breast cancer Sister 66    Social History Social History   Tobacco Use  . Smoking status: Never Smoker  . Smokeless tobacco: Never Used  Vaping Use  . Vaping Use: Never used  Substance Use Topics  . Alcohol use: No  . Drug use: No    Review of Systems Constitutional: No fever/chills Eyes: No visual changes. ENT: No sore throat. Cardiovascular: Denies chest pain. Respiratory: Denies shortness of breath. Gastrointestinal: No abdominal pain.  No nausea, no vomiting.  No diarrhea.   Genitourinary: Negative for dysuria. Musculoskeletal: Positive for right hip pain. Skin: Negative for rash. Neurological: Negative for headaches, focal weakness or numbness.  ____________________________________________   PHYSICAL EXAM:  VITAL SIGNS: ED Triage Vitals  Enc Vitals Group     BP 11/19/20 1643 135/79     Pulse Rate 11/19/20 1643 68     Resp 11/19/20 1643 20     Temp 11/19/20 1643 98.2 F (36.8 C)     Temp Source 11/19/20 1643 Oral     SpO2 11/19/20 1643 96 %     Weight 11/19/20 1642 123 lb (55.8 kg)     Height 11/19/20 1642 5\' 1"  (1.549 m)     Head Circumference --      Peak Flow --      Pain Score 11/19/20 1641 0   Constitutional: Alert and oriented.  Eyes: Conjunctivae are normal.  ENT      Head: Normocephalic and atraumatic.      Nose: No congestion/rhinnorhea.      Mouth/Throat: Mucous membranes are moist.      Neck: No stridor. Hematological/Lymphatic/Immunilogical: No cervical lymphadenopathy. Cardiovascular: Normal rate, regular rhythm.  No murmurs, rubs, or gallops.  Respiratory: Normal respiratory effort without tachypnea nor retractions. Breath sounds are clear and equal bilaterally. No  wheezes/rales/rhonchi. Gastrointestinal: Soft and non tender. No rebound. No guarding.  Genitourinary: Deferred Musculoskeletal: No deformity to the right leg. DP 2+. Compartments soft. Discomfort with ROM of the right hip.  Neurologic:  Normal speech and language. No gross focal neurologic deficits are appreciated.  Skin:  Small bruise to right lateral proximal thigh.  Psychiatric: Mood and affect are normal. Speech and behavior are normal. Patient exhibits appropriate insight and judgment.  ____________________________________________    LABS (pertinent positives/negatives)  CBC wbc 8.7, hgb 14.8, plt 165 BMP na 135, k 3.3, glu 185, cr 0.52  ____________________________________________   EKG  None  ____________________________________________    RADIOLOGY  CT head No acute abnormality  CT pelvis No evidence of acute fracture of right hip or pelvis. ____________________________________________   PROCEDURES  Procedures  ____________________________________________   INITIAL IMPRESSION / ASSESSMENT AND PLAN / ED COURSE  Pertinent labs & imaging results that were available during my care of the patient were reviewed by me and considered in my medical decision making (see chart for details).   Patient presented to the emergency department today because of concerns for continued and significant right sided hip pain.  This all started after a fall 4 days ago.  Had negative imaging at that time.  Today CT scan was obtained which not show any acute osseous abnormalities.  I discussed this finding with the patient.  Did try pain control here in the emergency department.  She was perhaps able to ambulate slightly better although still had some significant pain.  I did discuss with patient possibility of keeping patient in the emergency department for social work and PT consult in the morning for possible rehab placement.  I did give patient my concern about her safety going home  with the pain is significant.  However at this time patient declined.  She stated she would like to try to go home and continue to rest and improve there.  Will order home PT for the patient.   ____________________________________________   FINAL CLINICAL IMPRESSION(S) / ED DIAGNOSES  Final diagnoses:  Right hip pain     Note: This dictation was prepared with Dragon dictation. Any transcriptional errors that result from this process are unintentional     Nance Pear, MD 11/19/20 2306

## 2020-11-19 NOTE — ED Triage Notes (Signed)
Pt comes into the ED via EMS from home, c/o right hip pain , states she slipped and fell 3/3 while visiting a family member at [facility] and showed a small chip, pt states she is not able to bare wt today and having a lot of pain

## 2020-11-19 NOTE — Discharge Instructions (Addendum)
Please seek medical attention for any high fevers, chest pain, shortness of breath, change in behavior, persistent vomiting, bloody stool or any other new or concerning symptoms.  

## 2020-11-20 ENCOUNTER — Other Ambulatory Visit: Payer: Self-pay | Admitting: Family Medicine

## 2020-11-20 ENCOUNTER — Other Ambulatory Visit (HOSPITAL_COMMUNITY): Payer: Self-pay | Admitting: Family Medicine

## 2020-11-20 DIAGNOSIS — M5441 Lumbago with sciatica, right side: Secondary | ICD-10-CM

## 2020-11-23 ENCOUNTER — Other Ambulatory Visit: Payer: Self-pay

## 2020-11-23 ENCOUNTER — Ambulatory Visit
Admission: RE | Admit: 2020-11-23 | Discharge: 2020-11-23 | Disposition: A | Discharge status: Home / Self Care | Payer: Medicare Other | Source: Ambulatory Visit | Attending: Family Medicine | Admitting: Family Medicine

## 2020-11-23 DIAGNOSIS — M5441 Lumbago with sciatica, right side: Secondary | ICD-10-CM | POA: Insufficient documentation

## 2020-11-26 ENCOUNTER — Observation Stay
Admission: EM | Admit: 2020-11-26 | Discharge: 2020-11-29 | Disposition: A | Discharge status: Home Health | Payer: Medicare Other | Attending: Family Medicine | Admitting: Family Medicine

## 2020-11-26 ENCOUNTER — Encounter: Payer: Self-pay | Admitting: Emergency Medicine

## 2020-11-26 ENCOUNTER — Other Ambulatory Visit: Payer: Self-pay

## 2020-11-26 ENCOUNTER — Observation Stay: Payer: Medicare Other

## 2020-11-26 DIAGNOSIS — Z419 Encounter for procedure for purposes other than remedying health state, unspecified: Secondary | ICD-10-CM

## 2020-11-26 DIAGNOSIS — Z85038 Personal history of other malignant neoplasm of large intestine: Secondary | ICD-10-CM | POA: Diagnosis not present

## 2020-11-26 DIAGNOSIS — M549 Dorsalgia, unspecified: Secondary | ICD-10-CM | POA: Diagnosis present

## 2020-11-26 DIAGNOSIS — W19XXXA Unspecified fall, initial encounter: Secondary | ICD-10-CM | POA: Insufficient documentation

## 2020-11-26 DIAGNOSIS — Z79899 Other long term (current) drug therapy: Secondary | ICD-10-CM | POA: Diagnosis not present

## 2020-11-26 DIAGNOSIS — S32040A Wedge compression fracture of fourth lumbar vertebra, initial encounter for closed fracture: Principal | ICD-10-CM | POA: Insufficient documentation

## 2020-11-26 DIAGNOSIS — R0602 Shortness of breath: Secondary | ICD-10-CM

## 2020-11-26 DIAGNOSIS — Z7902 Long term (current) use of antithrombotics/antiplatelets: Secondary | ICD-10-CM | POA: Insufficient documentation

## 2020-11-26 DIAGNOSIS — Z20822 Contact with and (suspected) exposure to covid-19: Secondary | ICD-10-CM | POA: Insufficient documentation

## 2020-11-26 DIAGNOSIS — E119 Type 2 diabetes mellitus without complications: Secondary | ICD-10-CM | POA: Insufficient documentation

## 2020-11-26 DIAGNOSIS — I10 Essential (primary) hypertension: Secondary | ICD-10-CM | POA: Insufficient documentation

## 2020-11-26 DIAGNOSIS — J45909 Unspecified asthma, uncomplicated: Secondary | ICD-10-CM | POA: Diagnosis not present

## 2020-11-26 DIAGNOSIS — Y92239 Unspecified place in hospital as the place of occurrence of the external cause: Secondary | ICD-10-CM | POA: Insufficient documentation

## 2020-11-26 DIAGNOSIS — I2510 Atherosclerotic heart disease of native coronary artery without angina pectoris: Secondary | ICD-10-CM | POA: Insufficient documentation

## 2020-11-26 DIAGNOSIS — K219 Gastro-esophageal reflux disease without esophagitis: Secondary | ICD-10-CM | POA: Diagnosis present

## 2020-11-26 DIAGNOSIS — Z955 Presence of coronary angioplasty implant and graft: Secondary | ICD-10-CM | POA: Insufficient documentation

## 2020-11-26 DIAGNOSIS — E785 Hyperlipidemia, unspecified: Secondary | ICD-10-CM | POA: Diagnosis present

## 2020-11-26 DIAGNOSIS — E039 Hypothyroidism, unspecified: Secondary | ICD-10-CM | POA: Diagnosis not present

## 2020-11-26 DIAGNOSIS — Z7982 Long term (current) use of aspirin: Secondary | ICD-10-CM | POA: Diagnosis not present

## 2020-11-26 DIAGNOSIS — Z8673 Personal history of transient ischemic attack (TIA), and cerebral infarction without residual deficits: Secondary | ICD-10-CM | POA: Insufficient documentation

## 2020-11-26 LAB — CBC WITH DIFFERENTIAL/PLATELET
Abs Immature Granulocytes: 0.04 10*3/uL (ref 0.00–0.07)
Basophils Absolute: 0.1 10*3/uL (ref 0.0–0.1)
Basophils Relative: 1 %
Eosinophils Absolute: 0.2 10*3/uL (ref 0.0–0.5)
Eosinophils Relative: 2 %
HCT: 41.3 % (ref 36.0–46.0)
Hemoglobin: 14.1 g/dL (ref 12.0–15.0)
Immature Granulocytes: 1 %
Lymphocytes Relative: 28 %
Lymphs Abs: 2.5 10*3/uL (ref 0.7–4.0)
MCH: 31.7 pg (ref 26.0–34.0)
MCHC: 34.1 g/dL (ref 30.0–36.0)
MCV: 92.8 fL (ref 80.0–100.0)
Monocytes Absolute: 0.7 10*3/uL (ref 0.1–1.0)
Monocytes Relative: 8 %
Neutro Abs: 5.4 10*3/uL (ref 1.7–7.7)
Neutrophils Relative %: 60 %
Platelets: 215 10*3/uL (ref 150–400)
RBC: 4.45 MIL/uL (ref 3.87–5.11)
RDW: 13.3 % (ref 11.5–15.5)
WBC: 8.8 10*3/uL (ref 4.0–10.5)
nRBC: 0 % (ref 0.0–0.2)

## 2020-11-26 LAB — URINALYSIS, COMPLETE (UACMP) WITH MICROSCOPIC
Bilirubin Urine: NEGATIVE
Glucose, UA: NEGATIVE mg/dL
Hgb urine dipstick: NEGATIVE
Ketones, ur: NEGATIVE mg/dL
Nitrite: NEGATIVE
Protein, ur: 30 mg/dL — AB
Specific Gravity, Urine: 1.035 — ABNORMAL HIGH (ref 1.005–1.030)
WBC, UA: >50 WBC/hpf — ABNORMAL HIGH (ref 0–5)
pH: 5 (ref 5.0–8.0)

## 2020-11-26 LAB — BASIC METABOLIC PANEL
Anion gap: 8 (ref 5–15)
BUN: 31 mg/dL — ABNORMAL HIGH (ref 8–23)
CO2: 20 mmol/L — ABNORMAL LOW (ref 22–32)
Calcium: 8.7 mg/dL — ABNORMAL LOW (ref 8.9–10.3)
Chloride: 111 mmol/L (ref 98–111)
Creatinine, Ser: 0.65 mg/dL (ref 0.44–1.00)
GFR, Estimated: >60 mL/min (ref >60)
Glucose, Bld: 143 mg/dL — ABNORMAL HIGH (ref 70–99)
Potassium: 3.3 mmol/L — ABNORMAL LOW (ref 3.5–5.1)
Sodium: 139 mmol/L (ref 135–145)

## 2020-11-26 LAB — RESP PANEL BY RT-PCR (FLU A&B, COVID) ARPGX2
Influenza A by PCR: NEGATIVE
Influenza B by PCR: NEGATIVE
SARS Coronavirus 2 by RT PCR: NEGATIVE

## 2020-11-26 MED ORDER — MONTELUKAST SODIUM 10 MG PO TABS
10.0000 mg | ORAL_TABLET | Freq: Every day | ORAL | Status: DC
  Administered 2020-11-26 – 2020-11-28 (×3): 10 mg via ORAL
  Filled 2020-11-26 (×4): qty 1

## 2020-11-26 MED ORDER — CLOPIDOGREL BISULFATE 75 MG PO TABS: 75.0000 mg | ORAL_TABLET | Freq: Every day | ORAL | Status: DC

## 2020-11-26 MED ORDER — MORPHINE SULFATE (PF) 4 MG/ML IV SOLN
4.0000 mg | Freq: Once | INTRAVENOUS | Status: AC
  Administered 2020-11-26: 4 mg via INTRAVENOUS
  Filled 2020-11-26: qty 1

## 2020-11-26 MED ORDER — ENOXAPARIN SODIUM 40 MG/0.4ML ~~LOC~~ SOLN: 40.0000 mg | SUBCUTANEOUS | Status: DC

## 2020-11-26 MED ORDER — BENZONATATE 100 MG PO CAPS: 100.0000 mg | ORAL_CAPSULE | Freq: Three times a day (TID) | ORAL | Status: DC | PRN

## 2020-11-26 MED ORDER — PANTOPRAZOLE SODIUM 40 MG PO TBEC
40.0000 mg | DELAYED_RELEASE_TABLET | Freq: Every day | ORAL | Status: DC
  Administered 2020-11-26 – 2020-11-29 (×4): 40 mg via ORAL
  Filled 2020-11-26 (×4): qty 1

## 2020-11-26 MED ORDER — ONDANSETRON HCL 4 MG/2ML IJ SOLN
4.0000 mg | Freq: Once | INTRAMUSCULAR | Status: AC
  Administered 2020-11-26: 4 mg via INTRAVENOUS
  Filled 2020-11-26: qty 2

## 2020-11-26 MED ORDER — ONDANSETRON HCL 4 MG/2ML IJ SOLN: 4.0000 mg | Freq: Four times a day (QID) | INTRAMUSCULAR | Status: DC | PRN

## 2020-11-26 MED ORDER — ACETAMINOPHEN 325 MG PO TABS
650.0000 mg | ORAL_TABLET | Freq: Once | ORAL | Status: AC
  Administered 2020-11-26: 650 mg via ORAL
  Filled 2020-11-26: qty 2

## 2020-11-26 MED ORDER — LEVOTHYROXINE SODIUM 25 MCG PO TABS
25.0000 ug | ORAL_TABLET | Freq: Every day | ORAL | Status: DC
  Administered 2020-11-27 – 2020-11-29 (×2): 25 ug via ORAL
  Filled 2020-11-26 (×2): qty 1

## 2020-11-26 MED ORDER — CEFAZOLIN SODIUM-DEXTROSE 1-4 GM/50ML-% IV SOLN
1.0000 g | Freq: Once | INTRAVENOUS | Status: AC
  Administered 2020-11-28: 1 g via INTRAVENOUS
  Filled 2020-11-26: qty 50

## 2020-11-26 MED ORDER — LIDOCAINE 5 % EX PTCH
1.0000 | MEDICATED_PATCH | CUTANEOUS | Status: DC
  Administered 2020-11-26 – 2020-11-29 (×3): 1 via TRANSDERMAL
  Filled 2020-11-26 (×4): qty 1

## 2020-11-26 MED ORDER — ACETAMINOPHEN 325 MG PO TABS: 650.0000 mg | ORAL_TABLET | Freq: Four times a day (QID) | ORAL | Status: DC | PRN

## 2020-11-26 MED ORDER — DEXAMETHASONE SODIUM PHOSPHATE 10 MG/ML IJ SOLN
10.0000 mg | Freq: Once | INTRAMUSCULAR | Status: AC
  Administered 2020-11-26: 10 mg via INTRAVENOUS
  Filled 2020-11-26: qty 1

## 2020-11-26 MED ORDER — FLUTICASONE PROPIONATE 50 MCG/ACT NA SUSP
2.0000 | Freq: Every day | NASAL | Status: DC | PRN
  Filled 2020-11-26: qty 16

## 2020-11-26 MED ORDER — METOPROLOL SUCCINATE ER 50 MG PO TB24
75.0000 mg | ORAL_TABLET | Freq: Every morning | ORAL | Status: DC
  Administered 2020-11-27 – 2020-11-29 (×3): 75 mg via ORAL
  Filled 2020-11-26 (×3): qty 1

## 2020-11-26 MED ORDER — ATORVASTATIN CALCIUM 20 MG PO TABS
20.0000 mg | ORAL_TABLET | Freq: Every day | ORAL | Status: DC
  Administered 2020-11-27 – 2020-11-29 (×3): 20 mg via ORAL
  Filled 2020-11-26 (×3): qty 1

## 2020-11-26 MED ORDER — MORPHINE SULFATE (PF) 2 MG/ML IV SOLN: 0.5000 mg | INTRAVENOUS | Status: DC | PRN

## 2020-11-26 MED ORDER — HEPARIN SODIUM (PORCINE) 5000 UNIT/ML IJ SOLN
5000.0000 [IU] | Freq: Three times a day (TID) | INTRAMUSCULAR | Status: AC
  Administered 2020-11-26 – 2020-11-27 (×5): 5000 [IU] via SUBCUTANEOUS
  Filled 2020-11-26 (×5): qty 1

## 2020-11-26 MED ORDER — ADULT MULTIVITAMIN W/MINERALS CH
1.0000 | ORAL_TABLET | Freq: Every day | ORAL | Status: DC
  Administered 2020-11-26 – 2020-11-28 (×3): 1 via ORAL
  Filled 2020-11-26 (×3): qty 1

## 2020-11-26 MED ORDER — LOSARTAN POTASSIUM 25 MG PO TABS
25.0000 mg | ORAL_TABLET | Freq: Every day | ORAL | Status: DC
  Administered 2020-11-26 – 2020-11-29 (×4): 25 mg via ORAL
  Filled 2020-11-26 (×4): qty 1

## 2020-11-26 MED ORDER — ASPIRIN 325 MG PO TABS: 325.0000 mg | ORAL_TABLET | Freq: Every day | ORAL | Status: DC

## 2020-11-26 MED ORDER — ONDANSETRON HCL 4 MG/2ML IJ SOLN: 4.0000 mg | Freq: Three times a day (TID) | INTRAMUSCULAR | Status: DC | PRN

## 2020-11-26 MED ORDER — HYDROCODONE-ACETAMINOPHEN 5-325 MG PO TABS
1.0000 | ORAL_TABLET | Freq: Four times a day (QID) | ORAL | Status: DC | PRN
  Administered 2020-11-26 – 2020-11-27 (×2): 1 via ORAL
  Filled 2020-11-26 (×3): qty 1

## 2020-11-26 MED ORDER — NIFEDIPINE ER OSMOTIC RELEASE 30 MG PO TB24
30.0000 mg | ORAL_TABLET | Freq: Every day | ORAL | Status: DC
  Administered 2020-11-26 – 2020-11-29 (×4): 30 mg via ORAL
  Filled 2020-11-26 (×4): qty 1

## 2020-11-26 NOTE — Evaluation (Signed)
Physical Therapy Evaluation Patient Details Name: Rachael Humphrey MRN: [id number] DOB: Sep 10, 1930 Today's Date: 11/26/2020   History of Present Illness  Pt is an 87 y.o. female with medical history significant for hyperlipidemia, hypertension, hypothyroid, GERD, history of compression fracture status post kyphoplasty, presents emergency department for chief concerns back pain resulting from a fall on 11/15/20.  MD assessment includes: compression fracture of L4 lumbar vertebra, acute hypoxic respiratory failure secondary to pain medication, HTN, and ecchymosis, fading at the left gluteus down to the mid thigh.    Clinical Impression  Pt was pleasant and motivated to participate during the session but was ultimately limited by back pain with mobility.  Pt required min to mod A with bed mobility tasks with log roll technique education provided.  Pt was able to stand and take several steps near the EOB but c/o increased back pain with WB activity.  Pt's SpO2 and HR were both WNL on 1.5LO2/min with no adverse symptoms reported other than back pain and "drowsiness" secondary to pain meds, nursing aware.  Pt will benefit from HHPT services upon discharge to safely address deficits listed in patient problem list for decreased caregiver assistance and eventual return to PLOF.      Follow Up Recommendations Home health PT;Supervision for mobility/OOB    Equipment Recommendations  None recommended by PT    Recommendations for Other Services       Precautions / Restrictions Precautions Precautions: Fall Restrictions Weight Bearing Restrictions: No      Mobility  Bed Mobility Overal bed mobility: Needs Assistance Bed Mobility: Rolling;Sidelying to Sit;Sit to Sidelying Rolling: Min assist Sidelying to sit: Min assist     Sit to sidelying: Mod assist General bed mobility comments: Log roll training provided    Transfers Overall transfer level: Needs assistance Equipment used: Rolling walker  (2 wheeled) Transfers: Sit to/from Stand Sit to Stand: Min guard;From elevated surface         General transfer comment: Extra time and effort to come to standing  Ambulation/Gait Ambulation/Gait assistance: Min guard Gait Distance (Feet): 10 Feet Assistive device: Rolling walker (2 wheeled) Gait Pattern/deviations: Step-through pattern;Decreased step length - right;Decreased step length - left Gait velocity: decreased   General Gait Details: Slow cadence with short B step length but steady without LOB  Stairs            Wheelchair Mobility    Modified Rankin (Stroke Patients Only)       Balance Overall balance assessment: Needs assistance   Sitting balance-Leahy Scale: Normal     Standing balance support: Bilateral upper extremity supported;During functional activity Standing balance-Leahy Scale: Good Standing balance comment: Min lean on the RW for support                             Pertinent Vitals/Pain Pain Assessment: 0-10 Pain Score: 5  Pain Location: Low back Pain Descriptors / Indicators: Aching;Sore Pain Intervention(s): Premedicated before session;Monitored during session;Repositioned    Home Living Family/patient expects to be discharged to:: Private residence Living Arrangements: Alone Available Help at Discharge: Family;Friend(s);Available 24 hours/day Type of Home: House Home Access: Stairs to enter Entrance Stairs-Rails: Right Entrance Stairs-Number of Steps: 1 with no rails or 2 with R rail Home Layout: One level Home Equipment: Walker - 2 wheels;Walker - 4 wheels;Bedside commode      Prior Function Level of Independence: Independent         Comments: Ind amb without  an AD, push mows and leaf blows yard, active, no other falls with current fall secondary to tripping over cords while visiting family in emergency room, Ind with ADLs, drives, runs all of her own errands     Hand Dominance        Extremity/Trunk  Assessment   Upper Extremity Assessment Upper Extremity Assessment: Overall WFL for tasks assessed    Lower Extremity Assessment Lower Extremity Assessment: Generalized weakness       Communication   Communication: No difficulties  Cognition Arousal/Alertness: Lethargic Behavior During Therapy: WFL for tasks assessed/performed Overall Cognitive Status: Within Functional Limits for tasks assessed                                 General Comments: Per patient, drowsy secondary to pain meds      General Comments      Exercises Other Exercises Other Exercises: HEP education with pt and visitor for BLE APs, QS, and GS x 10 each every 1-2 hours daily Other Exercises: Log roll training with pt and visitor   Assessment/Plan    PT Assessment Patient needs continued PT services  PT Problem List Decreased strength;Decreased activity tolerance;Decreased balance;Decreased mobility;Decreased knowledge of use of DME;Pain       PT Treatment Interventions Gait training;Stair training;Functional mobility training;Therapeutic activities;DME instruction;Therapeutic exercise;Balance training;Patient/family education    PT Goals (Current goals can be found in the Care Plan section)  Acute Rehab PT Goals Patient Stated Goal: To move with less pain PT Goal Formulation: With patient Time For Goal Achievement: 12/09/20 Potential to Achieve Goals: Good    Frequency Min 2X/week   Barriers to discharge        Co-evaluation               AM-PAC PT "6 Clicks" Mobility  Outcome Measure Help needed turning from your back to your side while in a flat bed without using bedrails?: A Little Help needed moving from lying on your back to sitting on the side of a flat bed without using bedrails?: A Little Help needed moving to and from a bed to a chair (including a wheelchair)?: A Little Help needed standing up from a chair using your arms (e.g., wheelchair or bedside chair)?: A  Little Help needed to walk in hospital room?: A Little Help needed climbing 3-5 steps with a railing? : A Lot 6 Click Score: 17    End of Session Equipment Utilized During Treatment: Gait belt Activity Tolerance: Patient limited by pain Patient left: in bed;with call bell/phone within reach;with family/visitor present;Other (comment) (bil rails up as pt was found in ER) Nurse Communication: Mobility status PT Visit Diagnosis: Difficulty in walking, not elsewhere classified (R26.2);Muscle weakness (generalized) (M62.81);Pain Pain - Right/Left:  (central low back) Pain - part of body:  (low back)    Time: 1340-1414 PT Time Calculation (min) (ACUTE ONLY): 34 min   Charges:   PT Evaluation $PT Eval Moderate Complexity: 1 Mod PT Treatments $Therapeutic Activity: 8-22 mins        D. Royetta Asal PT, DPT 11/26/20, 4:18 PM

## 2020-11-26 NOTE — ED Provider Notes (Signed)
[facility] Emergency Department Provider Note   ____________________________________________   Event Date/Time   First MD Initiated Contact with Patient 11/26/20 1108     (approximate)  I have reviewed the triage vital signs and the nursing notes.   HISTORY  Chief Complaint Back Pain (/)    HPI Rachael Humphrey is a 87 y.o. female patient arrived via EMS from home complaining of low back pain.  Patient had a fall on 11/15/2020.  Patient was seen at [facility] had MRIs showing a subacute compression fracture of L 4.  Daughter states pain control has been hard to achieve secondary to her multiple allergies to pain medications.  Further questioning showed mostly allergies consist of nausea and vomiting.  Daughter states states that she is having trouble with pain control through the PCP secondary to her allergies.         Past Medical History:  Diagnosis Date  . Cancer [facility]) 2005   colon cancer  . Colon cancer ([place])   . Complication of anesthesia   . Coronary artery disease   . Diabetes mellitus without complication ([place])   . GERD (gastroesophageal reflux disease)   . H/O hiatal hernia   . Hemorrhoids    internal-not a problem now.  . History of kidney stones    multiple  . History of shingles    04-18-14 3 weeks now -pain only left posterior back , no outbreaks  . Hypertension   . Hypothyroidism   . Myocardial infarction ([place])    2 months ago, stent placed [facility]  . PONV (postoperative nausea and vomiting)    no recent problems  . Stroke [facility])    TIA    Patient Active Problem List   Diagnosis Date Noted  . Back pain 11/26/2020  . Closed patellar sleeve fracture of right knee 04/08/2019  . Fall 02/12/2017  . Acquired hypothyroidism 10/06/2016  . Arm fracture 10/06/2016  . Benign neoplasm of colon 10/06/2016  . Cervical adenopathy 10/06/2016  . Benign essential hypertension 10/06/2016  . GERD (gastroesophageal reflux disease) 10/06/2016  .  Hiatal hernia 10/06/2016  . History of abnormal cervical Pap smear 10/06/2016  . History of TIA (transient ischemic attack) 10/06/2016  . Hyperlipidemia, unspecified 10/06/2016  . Nephrolithiasis 10/06/2016  . Senile osteoporosis 10/06/2016  . Type 2 diabetes mellitus without complication ([place]) 48/18/5631  . Rupture of implant of right breast 09/18/2015  . Chronic back pain 03/08/2014  . Compression fracture 03/08/2014  . Coronary artery disease 03/03/2014  . Wedge compression fracture of T7 vertebra ([place]) 02/22/2014    Past Surgical History:  Procedure Laterality Date  . APPENDECTOMY    . AUGMENTATION MAMMAPLASTY Bilateral 1978  . BREAST BIOPSY Right    over 30 years ago negative  . BREAST BIOPSY Left    negative over 30 years ago  . CATARACT EXTRACTION, BILATERAL    . COLECTOMY     partial small and large instestine removed  . CORONARY ANGIOPLASTY WITH STENT PLACEMENT     02-23-14 coranary stent placed.  . CYSTOSCOPY WITH RETROGRADE PYELOGRAM, URETEROSCOPY AND STENT PLACEMENT Bilateral 05/17/2014   Procedure: CYSTOSCOPY WITH RETROGRADE PYELOGRAM, URETEROSCOPY AND STENT PLACEMENT, BASKET STONE REMOVAL;  Surgeon: Alexis Frock, MD;  Location: [facility] ORS;  Service: Urology;  Laterality: Bilateral;  . KYPHOPLASTY N/A 11/25/2018   Procedure: KYPHOPLASTY T11, DIABETIC;  Surgeon: Hessie Knows, MD;  Location: [facility] ORS;  Service: Orthopedics;  Laterality: N/A;    Prior to Admission medications  Medication Sig Start Date End Date Taking? Authorizing Provider  aspirin 325 MG tablet Take 325 mg by mouth daily.    [provider]  atorvastatin (LIPITOR) 20 MG tablet Take 20 mg by mouth daily. Reported on 09/18/2015    [provider]  Calcium Carb-Cholecalciferol (CALCIUM 600 + D PO) Take 1 tablet by mouth daily.    [provider]  cetirizine (ZYRTEC) 10 MG tablet Take 10 mg by mouth daily. 07/18/16   [provider]  clopidogrel (PLAVIX) 75 MG tablet Take 75  mg by mouth daily.  11/12/15   [provider]  fluticasone (FLONASE) 50 MCG/ACT nasal spray Place 2 sprays into both nostrils daily as needed for allergies.  11/12/15   [provider]  levothyroxine (SYNTHROID, LEVOTHROID) 25 MCG tablet Take 25 mcg by mouth daily before breakfast.  12/10/16   [provider]  lidocaine (LIDODERM) 5 % Place 1 patch onto the skin daily. Remove & Discard patch within 12 hours or as directed by MD 11/15/20   Christy Gentles, MD  losartan (COZAAR) 25 MG tablet Take 25 mg by mouth daily.  08/13/16   [provider]  metoprolol succinate (TOPROL-XL) 50 MG 24 hr tablet Take 75 mg by mouth every morning.     [provider]  montelukast (SINGULAIR) 10 MG tablet Take 10 mg by mouth at bedtime.  07/18/16   [provider]  Multiple Vitamin (MULTIVITAMIN WITH MINERALS) TABS tablet Take 1 tablet by mouth daily.    [provider]  Multiple Vitamins-Minerals (PRESERVISION AREDS 2 PO) Take 1 tablet by mouth 2 (two) times daily.     [provider]  mupirocin ointment (BACTROBAN) 2 % Place 1 application into the nose 2 (two) times daily. 04/10/19   Epifanio Lesches, MD  NIFEdipine (PROCARDIA-XL/NIFEDICAL-XL) 30 MG 24 hr tablet Take 30 mg by mouth daily. 11/06/18   [provider]  pantoprazole (PROTONIX) 40 MG tablet Take 40 mg by mouth daily.  08/13/16   [provider]    Allergies Codeine, Darvon [propoxyphene], Demerol [meperidine], Gabapentin, Sulfa antibiotics, Tramadol, Voltaren [diclofenac sodium], and Zinc  Family History  Problem Relation Age of Onset  . Stroke Mother   . Breast cancer Sister 73    Social History Social History   Tobacco Use  . Smoking status: Never Smoker  . Smokeless tobacco: Never Used  Vaping Use  . Vaping Use: Never used  Substance Use Topics  . Alcohol use: No  . Drug use: No    Review of Systems Constitutional: No fever/chills Eyes: No visual  changes. ENT: No sore throat. Cardiovascular: Denies chest pain. Respiratory: Denies shortness of breath. Gastrointestinal: No abdominal pain.  No nausea, no vomiting.  No diarrhea.  No constipation. Genitourinary: Negative for dysuria. Musculoskeletal: Negative for back pain. Skin: Negative for rash. Neurological: Negative for headaches, focal weakness or numbness. Endocrine:  Diabetes, hyperlipidemia, hypertension, hypothyroidism. Hematological/Lymphatic:  Allergic/Immunilogical: Codeine, Darvon, Demerol, gabapentin, sulfa, tramadol, Voltaren, and NSAID. ____________________________________________   PHYSICAL EXAM:  VITAL SIGNS: ED Triage Vitals  Enc Vitals Group     BP 11/26/20 1103 133/84     Pulse Rate 11/26/20 1103 69     Resp 11/26/20 1103 18     Temp 11/26/20 1103 98 F (36.7 C)     Temp Source 11/26/20 1103 Oral     SpO2 11/26/20 1103 98 %     Weight 11/26/20 1104 120 lb (54.4 kg)     Height 11/26/20 1104  5\' 2"  (1.575 m)     Head Circumference --      Peak Flow --      Pain Score 11/26/20 1104 10     Pain Loc --      Pain Edu? --      Excl. in [place]? --    Constitutional: Alert and oriented.  Moderate distress.   Neck: No stridor. Hematological/Lymphatic/Immunilogical: No cervical lymphadenopathy. Cardiovascular: Normal rate, regular rhythm. Grossly normal heart sounds.  Good peripheral circulation. Respiratory: Normal respiratory effort.  No retractions. Lungs CTAB. Gastrointestinal: Soft and nontender. No distention. No abdominal bruits. No CVA tenderness. Genitourinary: Deferred Musculoskeletal: No obvious spinal deformity.  Patient is moderate guarding palpation of T11 and 12, and L4-S1. Neurologic:  Normal speech and language. No gross focal neurologic deficits are appreciated. No gait instability. Skin:  Skin is warm, dry and intact. No rash noted. Psychiatric: Mood and affect are normal. Speech and behavior are  normal.  ____________________________________________   LABS (all labs ordered are listed, but only abnormal results are displayed)  Labs Reviewed  RESP PANEL BY RT-PCR (FLU A&B, COVID) ARPGX2  CBC WITH DIFFERENTIAL/PLATELET  BASIC METABOLIC PANEL   ____________________________________________  EKG   ____________________________________________  RADIOLOGY I, Sable Feil, personally viewed and evaluated these images (plain radiographs) as part of my medical decision making, as well as reviewing the written report by the radiologist.  ED MD interpretation:    Official radiology report(s): No results found.  ____________________________________________   PROCEDURES  Procedure(s) performed (including Critical Care):  Procedures   ____________________________________________   INITIAL IMPRESSION / ASSESSMENT AND PLAN / ED COURSE  As part of my medical decision making, I reviewed the following data within the [facility]         Patient presents with continued back pain secondary to a fall on 11/15/2020.  Patient sustained a subacute L4 compression fracture but has advanced degenerative changes L5-S1.  Pain controlled in ED consisted of morphine 4 mg, Lidoderm patch, and Tylenol.  Discussed with orthopedics rationale for admission for pain control.     ____________________________________________   FINAL CLINICAL IMPRESSION(S) / ED DIAGNOSES  Final diagnoses:  Compression fracture of L4 vertebra, initial encounter [facility])     ED Discharge Orders    None      *Please note:  Rachael Humphrey was evaluated in Emergency Department on 11/26/2020 for the symptoms described in the history of present illness. She was evaluated in the context of the global COVID-19 pandemic, which necessitated consideration that the patient might be at risk for infection with the SARS-CoV-2 virus that causes COVID-19. Institutional protocols and algorithms that pertain  to the evaluation of patients at risk for COVID-19 are in a state of rapid change based on information released by regulatory bodies including the CDC and federal and state organizations. These policies and algorithms were followed during the patient's care in the ED.  Some ED evaluations and interventions may be delayed as a result of limited staffing during and the pandemic.*   Note:  This document was prepared using Dragon voice recognition software and may include unintentional dictation errors.    Sable Feil, PA-C 11/26/20 1245    Harvest Dark, MD 11/26/20 [phone]

## 2020-11-26 NOTE — ED Notes (Signed)
Pt assisted to bedside commode; assist x1 required.

## 2020-11-26 NOTE — ED Notes (Signed)
Pt with desat to low 70s after receiving pain medication; applied supplemental O2 @ 2L with good response; pt's sats 97%.

## 2020-11-26 NOTE — ED Notes (Signed)
Informed RN bed assigned 

## 2020-11-26 NOTE — Consult Note (Signed)
Reason for Consult: L4 compression fracture and sciatica Referring Physician: Dr. Randell Patient is an 87 y.o. female.  HPI: Patient is an 87 year old who suffered a fall on 3 3 has been having pain since then has been unable to ambulate at home secondary to this pain.  She is actually consulted to see me in the office but had such pain that she is brought to the emergency room and is now admitted.  She has had a history of prior compression fractures with good relief from kyphoplasty.  She is currently on Plavix for A. fib and needs to be off of that for a few days prior to procedure.  Past Medical History:  Diagnosis Date  . Cancer [facility]) 2005   colon cancer  . Colon cancer ([place])   . Complication of anesthesia   . Coronary artery disease   . Diabetes mellitus without complication ([place])   . GERD (gastroesophageal reflux disease)   . H/O hiatal hernia   . Hemorrhoids    internal-not a problem now.  . History of kidney stones    multiple  . History of shingles    04-18-14 3 weeks now -pain only left posterior back , no outbreaks  . Hypertension   . Hypothyroidism   . Myocardial infarction ([place])    2 months ago, stent placed [facility]  . PONV (postoperative nausea and vomiting)    no recent problems  . Stroke [facility])    TIA    Past Surgical History:  Procedure Laterality Date  . APPENDECTOMY    . AUGMENTATION MAMMAPLASTY Bilateral 1978  . BREAST BIOPSY Right    over 30 years ago negative  . BREAST BIOPSY Left    negative over 30 years ago  . CATARACT EXTRACTION, BILATERAL    . COLECTOMY     partial small and large instestine removed  . CORONARY ANGIOPLASTY WITH STENT PLACEMENT     02-23-14 coranary stent placed.  . CYSTOSCOPY WITH RETROGRADE PYELOGRAM, URETEROSCOPY AND STENT PLACEMENT Bilateral 05/17/2014   Procedure: CYSTOSCOPY WITH RETROGRADE PYELOGRAM, URETEROSCOPY AND STENT PLACEMENT, BASKET STONE REMOVAL;  Surgeon: Alexis Frock, MD;  Location: [facility];  Service: Urology;   Laterality: Bilateral;  . KYPHOPLASTY N/A 11/25/2018   Procedure: KYPHOPLASTY T11, DIABETIC;  Surgeon: Hessie Knows, MD;  Location: [facility] ORS;  Service: Orthopedics;  Laterality: N/A;    Family History  Problem Relation Age of Onset  . Stroke Mother   . Breast cancer Sister 25    Social History:  reports that she has never smoked. She has never used smokeless tobacco. She reports that she does not drink alcohol and does not use drugs.  Allergies:  Allergies  Allergen Reactions  . Codeine Nausea And Vomiting  . Darvon [Propoxyphene] Other (See Comments)    Altered mental status    . Demerol [Meperidine] Other (See Comments)    Altered mental status    . Gabapentin Other (See Comments)  . Sulfa Antibiotics Other (See Comments)  . Tramadol Other (See Comments)    "makes me crazy"  "makes me crazy"   . Voltaren [Diclofenac Sodium] Other (See Comments)    "Makes me crazy" "Makes me crazy"  . Zinc     Hurting in lower stomach     Medications: I have reviewed the patient's current medications.  Results for orders placed or performed during the hospital encounter of 11/26/20 (from the past 48 hour(s))  Basic metabolic panel     Status: Abnormal  Collection Time: 11/26/20 12:18 PM  Result Value Ref Range   Sodium 139 135 - 145 mmol/L   Potassium 3.3 (L) 3.5 - 5.1 mmol/L   Chloride 111 98 - 111 mmol/L   CO2 20 (L) 22 - 32 mmol/L   Glucose, Bld 143 (H) 70 - 99 mg/dL    Comment: Glucose reference range applies only to samples taken after fasting for at least 8 hours.   BUN 31 (H) 8 - 23 mg/dL   Creatinine, Ser 0.65 0.44 - 1.00 mg/dL   Calcium 8.7 (L) 8.9 - 10.3 mg/dL   GFR, Estimated >60 >60 mL/min    Comment: (NOTE) Calculated using the CKD-EPI Creatinine Equation (2021)    Anion gap 8 5 - 15    Comment: Performed at [facility], [address] [place], [place] [address]  CBC with Differential     Status: None   Collection Time: 11/26/20 12:18 PM  Result  Value Ref Range   WBC 8.8 4.0 - 10.5 K/uL   RBC 4.45 3.87 - 5.11 MIL/uL   Hemoglobin 14.1 12.0 - 15.0 g/dL   HCT 41.3 36.0 - 46.0 %   MCV 92.8 80.0 - 100.0 fL   MCH 31.7 26.0 - 34.0 pg   MCHC 34.1 30.0 - 36.0 g/dL   RDW 13.3 11.5 - 15.5 %   Platelets 215 150 - 400 K/uL   nRBC 0.0 0.0 - 0.2 %   Neutrophils Relative % 60 %   Neutro Abs 5.4 1.7 - 7.7 K/uL   Lymphocytes Relative 28 %   Lymphs Abs 2.5 0.7 - 4.0 K/uL   Monocytes Relative 8 %   Monocytes Absolute 0.7 0.1 - 1.0 K/uL   Eosinophils Relative 2 %   Eosinophils Absolute 0.2 0.0 - 0.5 K/uL   Basophils Relative 1 %   Basophils Absolute 0.1 0.0 - 0.1 K/uL   Immature Granulocytes 1 %   Abs Immature Granulocytes 0.04 0.00 - 0.07 K/uL    Comment: Performed at [facility], [address], [place], [place] [address]  Resp Panel by RT-PCR (Flu A&B, Covid) Nasopharyngeal Swab     Status: None   Collection Time: 11/26/20 12:30 PM   Specimen: Nasopharyngeal Swab; Nasopharyngeal(NP) swabs in vial transport medium  Result Value Ref Range   SARS Coronavirus 2 by RT PCR NEGATIVE NEGATIVE    Comment: (NOTE) SARS-CoV-2 target nucleic acids are NOT DETECTED.  The SARS-CoV-2 RNA is generally detectable in upper respiratory specimens during the acute phase of infection. The lowest concentration of SARS-CoV-2 viral copies this assay can detect is 138 copies/mL. A negative result does not preclude SARS-Cov-2 infection and should not be used as the sole basis for treatment or other patient management decisions. A negative result may occur with  improper specimen collection/handling, submission of specimen other than nasopharyngeal swab, presence of viral mutation(s) within the areas targeted by this assay, and inadequate number of viral copies(<138 copies/mL). A negative result must be combined with clinical observations, patient history, and epidemiological information. The expected result is Negative.  Fact Sheet for Patients:   EntrepreneurPulse.com.au  Fact Sheet for Healthcare Providers:  IncredibleEmployment.be  This test is no t yet approved or cleared by the [place] FDA and  has been authorized for detection and/or diagnosis of SARS-CoV-2 by FDA under an Emergency Use Authorization (EUA). This EUA will remain  in effect (meaning this test can be used) for the duration of the COVID-19 declaration under Section 564(b)(1) of the  Act, 21 U.S.C.section 360bbb-3(b)(1), unless the authorization is terminated  or revoked sooner.       Influenza A by PCR NEGATIVE NEGATIVE   Influenza B by PCR NEGATIVE NEGATIVE    Comment: (NOTE) The Xpert Xpress SARS-CoV-2/FLU/RSV plus assay is intended as an aid in the diagnosis of influenza from Nasopharyngeal swab specimens and should not be used as a sole basis for treatment. Nasal washings and aspirates are unacceptable for Xpert Xpress SARS-CoV-2/FLU/RSV testing.  Fact Sheet for Patients: EntrepreneurPulse.com.au  Fact Sheet for Healthcare Providers: IncredibleEmployment.be  This test is not yet approved or cleared by the [place] FDA and has been authorized for detection and/or diagnosis of SARS-CoV-2 by FDA under an Emergency Use Authorization (EUA). This EUA will remain in effect (meaning this test can be used) for the duration of the COVID-19 declaration under Section 564(b)(1) of the Act, 21 U.S.C. section 360bbb-3(b)(1), unless the authorization is terminated or revoked.  Performed at [facility], [address] [place], [place] [address]     Chest Portable 1 View  Result Date: 11/26/2020 CLINICAL DATA:  Oxygen desaturation. EXAM: PORTABLE CHEST 1 VIEW COMPARISON:  April 08, 2019. FINDINGS: The heart size and mediastinal contours are within normal limits. Both lungs are clear. No pneumothorax or pleural effusion is noted. The visualized skeletal structures are  unremarkable. IMPRESSION: No active disease. Electronically Signed   By: Marijo Conception M.D.   On: 11/26/2020 13:18    Review of Systems Blood pressure 137/82, pulse 69, temperature 98 F (36.7 C), temperature source Oral, resp. rate 16, height 5\' 2"  (1.575 m), weight 54.4 kg, SpO2 96 %. Physical Exam Tender to palpation at the lower lumbar spine no clonus.  Diminished sensation on the lateral and anterior calf  Assessment/Plan: Debilitating pain secondary to L4 compression fracture.  She also has some evidence of spinal stenosis that is new.  Recommendation is for kyphoplasty at L22's and a subsequent need epidural steroid if radicular symptoms persist but needs to be off Plavix for surgery.  Hessie Knows 11/26/2020, 4:43 PM

## 2020-11-26 NOTE — H&P (Signed)
History and Physical   Rachael Humphrey YTK:[id number] DOB: [birth year] DOA: 11/26/2020  PCP: Idelle Crouch, MD  Outpatient Specialists: Dr. Rudene Christians Patient coming from: home via EMS  I have personally briefly reviewed patient's old medical records in [place].  Chief Concern: back pain  HPI: Rachael Humphrey is a 87 y.o. female with medical history significant for hyperlipidemia, hypertension, hypothyroid, GERD, history of compression fracture status post kyphoplasty, presents emergency department for chief concerns of a fall.  Patient reports that she was visiting her sister in hospital in the floor was slippery and she fell.  This happened on 11/15/2020.  She did endorse ecchymosis of her lower back side and pain in her back.  She states the pain is 10 out of 10 and worst pain she is ever felt in her life.  She does endorse some numbness in her right lower extremity since the fall.  She denies dysuria, hematuria, chest pain, abdominal pain, shortness of breath, nausea, vomiting.  She denies dysuria and hematuria.  She denies diarrhea.  11/15/2020 imaging: Imaging of the hip was done with x-ray and showed moderate severity degenerative changes without evidence of an acute osseous abnormality, old fracture deformity of the patella.  No acute osseous abnormality, possible subtle superior endplate deformity at L4, treated compression fractures at T11 and T12, right tibia and fibula was remarkable for osteoarthritis no acute fracture.  She presented on 11/19/2020: Head trauma and recent fall, CT of the head without contrast ordered and read as no acute evidence of acute intracranial abnormality, mild chronic small vessel ischemic disease.  CT of the pelvis without contrast was also ordered and read as no evidence of acute fracture in the right hip or pelvis.  Bones are diffusely demineralized.  Small periumbilical and infraumbilical ventral hernias containing only fat.  Left colonic diverticulosis  without diverticulitis.  11/23/2020 MRI of the lumbar spine without contrast revealed subacute L4 compression deformity with mild height loss in 3 to 4 mm retropulsion.  Mild spinal canal/neural foraminal narrowing at this level.  Sequela T11 and T12 kyphoplasty.  Multilevel lumbar spondylosis.  Covid vaccine: Patient is vaccinated with 2 doses  Social history: She lives by herself and does not have children.  Her husband and son passed away.  She does not smoke tobacco products, drink alcohol, or do recreational drugs.  ROS: Unable to complete as patient states that I talk too much and that all I do is talk.  She does not want to talk anymore.  ED Course: Discussed with ED provider, patient requiring hospitalization due to back pain.  Vitals in the ED was remarkable for temperature of 98, respiration rate of 16, heart rate 69, blood pressure 137/82, satting at 96% on 2 L nasal cannula.  Labs in the ED was remarkable for serum creatinine 0.52, potassium 3.3, nonfasting blood sugar 185, WBC 8.7, hemoglobin 14.8, platelets 165.  Assessment/Plan  Principal Problem:   Closed compression fracture of L4 lumbar vertebra, initial encounter [facility]) Active Problems:   Acquired hypothyroidism   Coronary artery disease   Benign essential hypertension   GERD (gastroesophageal reflux disease)   History of TIA (transient ischemic attack)   Hyperlipidemia, unspecified   Type 2 diabetes mellitus without complication (HCC)   Fall   Back pain   Closed compression fracture of the L4 lumbar vertebra Mild spinal canal/neural foraminal narrowing -Morphine 0.5 mg IV every 6 hours as needed for severe pain -Norco 5 1 to 2 tablets every 6  hours as needed for moderate pain -Acetaminophen 650 mg every 6 hours as needed for mild pain, fever, headache, 3 days ordered -Decadron 10 mg IV once -Orthopedic provider, Dr. Rudene Christians has been consulted and plans to take patient to the OR for intervention on 11/28/2020 -He  would like for Plavix to be held -Heparin 5000 units subcutaneous every 8 hours ordered for DVT prophylaxis, please change appropriately pending orthopedic care -PT, OT, TOC consulted  Acute hypoxic respiratory failure secondary to pain medication -Status post morphine 4 mg per ED provider -We will continue pain medication however will decrease dosing  -Portable chest x-ray ordered  Hyperlipidemia-atorvastatin 20 mg daily resumed  GERD-Protonix 40 mg daily  Hypothyroid levothyroxine 25 mcg daily  Hypertension-we will start 25 mg daily, metoprolol succinate 75 mg daily, nifedipine 30 mg daily  CAD-aspirin and Plavix has been held -Last time she took Plavix was Sunday, 11/25/2020  Ecchymosis, fading at the left gluteus down to the mid thigh -Pale green and yellow -No signs of active bleeding -Present on admission  Chart reviewed.   DVT prophylaxis: TED hose, heparin 5000 units subcutaneous every 8 hours, 5 doses ordered with instructions to stop at least 2 hours prior to procedure, anticipate procedure on 11/28/2020 Code Status: Full code Diet: Heart healthy/carb modified Family Communication: Updated friend who calls patient grandmother at bedside Disposition Plan: Pending clinical course Consults called: Orthopedic Admission status: Observation, MedSurg, with telemetry  Past Medical History:  Diagnosis Date  . Cancer [facility]) 2005   colon cancer  . Colon cancer ([place])   . Complication of anesthesia   . Coronary artery disease   . Diabetes mellitus without complication ([place])   . GERD (gastroesophageal reflux disease)   . H/O hiatal hernia   . Hemorrhoids    internal-not a problem now.  . History of kidney stones    multiple  . History of shingles    04-18-14 3 weeks now -pain only left posterior back , no outbreaks  . Hypertension   . Hypothyroidism   . Myocardial infarction ([place])    2 months ago, stent placed [facility]  . PONV (postoperative nausea and vomiting)    no  recent problems  . Stroke [facility])    TIA   Past Surgical History:  Procedure Laterality Date  . APPENDECTOMY    . AUGMENTATION MAMMAPLASTY Bilateral 1978  . BREAST BIOPSY Right    over 30 years ago negative  . BREAST BIOPSY Left    negative over 30 years ago  . CATARACT EXTRACTION, BILATERAL    . COLECTOMY     partial small and large instestine removed  . CORONARY ANGIOPLASTY WITH STENT PLACEMENT     02-23-14 coranary stent placed.  . CYSTOSCOPY WITH RETROGRADE PYELOGRAM, URETEROSCOPY AND STENT PLACEMENT Bilateral 05/17/2014   Procedure: CYSTOSCOPY WITH RETROGRADE PYELOGRAM, URETEROSCOPY AND STENT PLACEMENT, BASKET STONE REMOVAL;  Surgeon: Alexis Frock, MD;  Location: [facility] ORS;  Service: Urology;  Laterality: Bilateral;  . KYPHOPLASTY N/A 11/25/2018   Procedure: KYPHOPLASTY T11, DIABETIC;  Surgeon: Hessie Knows, MD;  Location: [facility] ORS;  Service: Orthopedics;  Laterality: N/A;   Social History:  reports that she has never smoked. She has never used smokeless tobacco. She reports that she does not drink alcohol and does not use drugs.  Allergies  Allergen Reactions  . Codeine Nausea And Vomiting  . Darvon [Propoxyphene] Other (See Comments)    Altered mental status    . Demerol [Meperidine] Other (See Comments)    Altered mental  status    . Gabapentin Other (See Comments)  . Sulfa Antibiotics Other (See Comments)  . Tramadol Other (See Comments)    "makes me crazy"  "makes me crazy"   . Voltaren [Diclofenac Sodium] Other (See Comments)    "Makes me crazy" "Makes me crazy"  . Zinc     Hurting in lower stomach    Family History  Problem Relation Age of Onset  . Stroke Mother   . Breast cancer Sister 36   Family history: Family history reviewed and not pertinent  Prior to Admission medications   Medication Sig Start Date End Date Taking? Authorizing Provider  aspirin 325 MG tablet Take 325 mg by mouth daily.    [provider]  atorvastatin (LIPITOR) 20 MG tablet  Take 20 mg by mouth daily. Reported on 09/18/2015    [provider]  Calcium Carb-Cholecalciferol (CALCIUM 600 + D PO) Take 1 tablet by mouth daily.    [provider]  cetirizine (ZYRTEC) 10 MG tablet Take 10 mg by mouth daily. 07/18/16   [provider]  clopidogrel (PLAVIX) 75 MG tablet Take 75 mg by mouth daily.  11/12/15   [provider]  fluticasone (FLONASE) 50 MCG/ACT nasal spray Place 2 sprays into both nostrils daily as needed for allergies.  11/12/15   [provider]  levothyroxine (SYNTHROID, LEVOTHROID) 25 MCG tablet Take 25 mcg by mouth daily before breakfast.  12/10/16   [provider]  lidocaine (LIDODERM) 5 % Place 1 patch onto the skin daily. Remove & Discard patch within 12 hours or as directed by MD 11/15/20   Christy Gentles, MD  losartan (COZAAR) 25 MG tablet Take 25 mg by mouth daily.  08/13/16   [provider]  metoprolol succinate (TOPROL-XL) 50 MG 24 hr tablet Take 75 mg by mouth every morning.     [provider]  montelukast (SINGULAIR) 10 MG tablet Take 10 mg by mouth at bedtime.  07/18/16   [provider]  Multiple Vitamin (MULTIVITAMIN WITH MINERALS) TABS tablet Take 1 tablet by mouth daily.    [provider]  Multiple Vitamins-Minerals (PRESERVISION AREDS 2 PO) Take 1 tablet by mouth 2 (two) times daily.     [provider]  mupirocin ointment (BACTROBAN) 2 % Place 1 application into the nose 2 (two) times daily. 04/10/19   Epifanio Lesches, MD  NIFEdipine (PROCARDIA-XL/NIFEDICAL-XL) 30 MG 24 hr tablet Take 30 mg by mouth daily. 11/06/18   [provider]  pantoprazole (PROTONIX) 40 MG tablet Take 40 mg by mouth daily.  08/13/16   [provider]   Physical Exam: Vitals:   11/26/20 1200 11/26/20 1215 11/26/20 1218 11/26/20 1219  BP: 137/82 137/82    Pulse: 63 71 68 69  Resp: 16 14 14 16   Temp:      TempSrc:      SpO2: 98% 96% (!) 85% 96%  Weight:       Height:       Constitutional: appears age-appropriate, NAD, calm, comfortable Eyes: PERRL, lids and conjunctivae normal ENMT: Mucous membranes are moist. Posterior pharynx clear of any exudate or lesions. Age-appropriate dentition.  Mild hearing loss Neck: normal, supple, no masses, no thyromegaly Respiratory: clear to auscultation bilaterally, no wheezing, no crackles. Normal respiratory effort. No accessory muscle use.  Cardiovascular: Regular rate and rhythm, no murmurs / rubs / gallops. No extremity edema. 2+ pedal pulses. No carotid bruits.  Abdomen: no tenderness, no masses palpated, no hepatosplenomegaly. Bowel  sounds positive.  Musculoskeletal: no clubbing / cyanosis. No joint deformity upper and lower extremities. Good ROM, no contractures, no atrophy. Normal muscle tone.  Skin: no rashes, lesions, ulcers. No induration.  Ecchymosis of the left gluteal down to the mid upper thigh.  Appears to be fading light green and yellow. Neurologic: Sensation intact. Strength 5/5 in all 4.  Psychiatric: Normal judgment and insight. Alert and oriented x 3. Normal mood.   EKG: Not indicated  Chest x-ray on Admission: I personally reviewed and I agree with radiologist reading as below.  Chest Portable 1 View  Result Date: 11/26/2020 CLINICAL DATA:  Oxygen desaturation. EXAM: PORTABLE CHEST 1 VIEW COMPARISON:  April 08, 2019. FINDINGS: The heart size and mediastinal contours are within normal limits. Both lungs are clear. No pneumothorax or pleural effusion is noted. The visualized skeletal structures are unremarkable. IMPRESSION: No active disease. Electronically Signed   By: Marijo Conception M.D.   On: 11/26/2020 13:18   Labs on Admission: I have personally reviewed following labs  CBC: Recent Labs  Lab 11/19/20 2116 11/26/20 1218  WBC 8.7 8.8  NEUTROABS 5.5 5.4  HGB 14.8 14.1  HCT 43.0 41.3  MCV 90.9 92.8  PLT 165 015   Basic Metabolic Panel: Recent Labs  Lab 11/19/20 2116  11/26/20 1218  NA 135 139  K 3.3* 3.3*  CL 105 111  CO2 21* 20*  GLUCOSE 185* 143*  BUN 16 31*  CREATININE 0.52 0.65  CALCIUM 9.0 8.7*   GFR: Estimated Creatinine Clearance: 37.7 mL/min (by C-G formula based on SCr of 0.65 mg/dL).  Urine analysis:    Component Value Date/Time   COLORURINE YELLOW (A) 02/13/2017 0727   APPEARANCEUR CLEAR (A) 02/13/2017 0727   APPEARANCEUR Clear 04/01/2014 0246   LABSPEC 1.020 02/13/2017 0727   LABSPEC 1.006 04/01/2014 0246   PHURINE 6.0 02/13/2017 0727   GLUCOSEU NEGATIVE 02/13/2017 0727   GLUCOSEU >=500 04/01/2014 0246   HGBUR NEGATIVE 02/13/2017 0727   BILIRUBINUR NEGATIVE 02/13/2017 0727   BILIRUBINUR Negative 04/01/2014 0246   KETONESUR NEGATIVE 02/13/2017 0727   PROTEINUR NEGATIVE 02/13/2017 0727   NITRITE NEGATIVE 02/13/2017 0727   LEUKOCYTESUR TRACE (A) 02/13/2017 0727   LEUKOCYTESUR Negative 04/01/2014 0246    N  D.O. [facility]  If 7PM-7AM, please contact overnight-coverage provider If 7AM-7PM, please contact day coverage provider [email or web address]  11/26/2020, 2:30 PM

## 2020-11-26 NOTE — ED Triage Notes (Signed)
Presents to ED via EMS from home  States she had a fall on 3/3   Was seen at [facility]  conts to have pain  States pain is moving into left leg

## 2020-11-27 DIAGNOSIS — S32040A Wedge compression fracture of fourth lumbar vertebra, initial encounter for closed fracture: Secondary | ICD-10-CM | POA: Diagnosis not present

## 2020-11-27 LAB — CBC
HCT: 43.8 % (ref 36.0–46.0)
Hemoglobin: 15.1 g/dL — ABNORMAL HIGH (ref 12.0–15.0)
MCH: 31.9 pg (ref 26.0–34.0)
MCHC: 34.5 g/dL (ref 30.0–36.0)
MCV: 92.4 fL (ref 80.0–100.0)
Platelets: 232 10*3/uL (ref 150–400)
RBC: 4.74 MIL/uL (ref 3.87–5.11)
RDW: 13.4 % (ref 11.5–15.5)
WBC: 9.2 10*3/uL (ref 4.0–10.5)
nRBC: 0 % (ref 0.0–0.2)

## 2020-11-27 LAB — BASIC METABOLIC PANEL
Anion gap: 9 (ref 5–15)
BUN: 28 mg/dL — ABNORMAL HIGH (ref 8–23)
CO2: 23 mmol/L (ref 22–32)
Calcium: 9.8 mg/dL (ref 8.9–10.3)
Chloride: 109 mmol/L (ref 98–111)
Creatinine, Ser: 0.7 mg/dL (ref 0.44–1.00)
GFR, Estimated: >60 mL/min (ref >60)
Glucose, Bld: 131 mg/dL — ABNORMAL HIGH (ref 70–99)
Potassium: 4.3 mmol/L (ref 3.5–5.1)
Sodium: 141 mmol/L (ref 135–145)

## 2020-11-27 MED ORDER — ORAL CARE MOUTH RINSE
15.0000 mL | Freq: Two times a day (BID) | OROMUCOSAL | Status: DC
  Administered 2020-11-27 – 2020-11-29 (×3): 15 mL via OROMUCOSAL

## 2020-11-27 MED ORDER — ENSURE ENLIVE PO LIQD
237.0000 mL | Freq: Three times a day (TID) | ORAL | Status: DC
  Administered 2020-11-27 – 2020-11-29 (×4): 237 mL via ORAL

## 2020-11-27 MED ORDER — OXYCODONE HCL 5 MG PO TABS
5.0000 mg | ORAL_TABLET | ORAL | Status: DC | PRN
  Administered 2020-11-27: 5 mg via ORAL
  Filled 2020-11-27: qty 1

## 2020-11-27 MED ORDER — METHOCARBAMOL 500 MG PO TABS
500.0000 mg | ORAL_TABLET | Freq: Three times a day (TID) | ORAL | Status: DC
  Administered 2020-11-27 – 2020-11-28 (×4): 500 mg via ORAL
  Filled 2020-11-27 (×6): qty 1

## 2020-11-27 MED ORDER — ACETAMINOPHEN 500 MG PO TABS
1000.0000 mg | ORAL_TABLET | Freq: Three times a day (TID) | ORAL | Status: DC
  Administered 2020-11-27 – 2020-11-29 (×6): 1000 mg via ORAL
  Filled 2020-11-27 (×6): qty 2

## 2020-11-27 NOTE — Progress Notes (Signed)
patient continues to hurt in low back. L 4 compression fracture is new along with her sciatica. Plan  kyphoplasty tomorrow. Site marked

## 2020-11-27 NOTE — Care Management Obs Status (Signed)
[place] NOTIFICATION   Patient Details  Name: Rachael Humphrey MRN: [id number] Date of Birth: 1929-10-22   Medicare Observation Status Notification Given:  Yes    Shelbie Hutching, RN 11/27/2020, 2:13 PM

## 2020-11-27 NOTE — Progress Notes (Signed)
Initial Nutrition Assessment  DOCUMENTATION CODES:   Not applicable  INTERVENTION:   -Ensure Enlive po TID, each supplement provides 350 kcal and 20 grams of protein -MVI with minerals daily  NUTRITION DIAGNOSIS:   Inadequate oral intake related to decreased appetite as evidenced by meal completion < 50%.  GOAL:   Patient will meet greater than or equal to 90% of their needs  MONITOR:   PO intake,Supplement acceptance,Diet advancement,Labs,Weight trends,Skin,I & O's  REASON FOR ASSESSMENT:   Consult Assessment of nutrition requirement/status,Hip fracture protocol  ASSESSMENT:   Rachael Humphrey is a 87 y.o. female with medical history significant for hyperlipidemia, hypertension, hypothyroid, GERD, history of compression fracture status post kyphoplasty, presents emergency department for chief concerns of a fall.  Pt admitted with closed compression fracture of the L4 lumbar vertebra.   Per orthopedics notes, recommending kyphoplasty, however, need to be off Plavix prior to procedure.   Spoke with pt at bedside, who reports she has a good appetite at baseline. She shares that she consumes 2-3 meals per day (Breakfast: eggs and cereal; Lunch: Ritz crackers and peanut butter; Dinner: soup and salad). Per pt, her intake has declined since the weekend secondary to pain and poor appetite. She did not eat any breakfast today.   Reviewed wt hx; pt has experienced a 11% wt loss over the past year. Pt denies any weight loss.   Pt shares that she was very active PTA- living alone driving, and doing housework. Discussed importance of good meal and supplement intake. Pt amenable to Ensure Enlive supplements.   Medications reviewed.   Labs reviewed.   NUTRITION - FOCUSED PHYSICAL EXAM:  Flowsheet Row Most Recent Value  Orbital Region Moderate depletion  Upper Arm Region Mild depletion  Thoracic and Lumbar Region No depletion  Buccal Region No depletion  Temple Region Mild  depletion  Clavicle Bone Region No depletion  Clavicle and Acromion Bone Region No depletion  Scapular Bone Region No depletion  Dorsal Hand No depletion  Patellar Region Mild depletion  Anterior Thigh Region Mild depletion  Posterior Calf Region Mild depletion  Edema (RD Assessment) None  Hair Reviewed  Eyes Reviewed  Mouth Reviewed  Skin Reviewed  Nails Reviewed       Diet Order:   Diet Order            Diet NPO time specified  Diet effective 0500           Diet heart healthy/carb modified Room service appropriate? Yes; Fluid consistency: Thin  Diet effective now                 EDUCATION NEEDS:   Education needs have been addressed  Skin:  Skin Assessment: Reviewed RN Assessment  Last BM:  Unknown  Height:   Ht Readings from Last 1 Encounters:  11/26/20 5\' 2"  (1.575 m)    Weight:   Wt Readings from Last 1 Encounters:  11/26/20 54.4 kg    Ideal Body Weight:  50 kg  BMI:  Body mass index is 21.95 kg/m.  Estimated Nutritional Needs:   Kcal:  1450-1650  Protein:  65-80 grams  Fluid:  > 1.4 L    Loistine Chance, RD, LDN, [place] Registered Dietitian II Certified Diabetes Care and Education Specialist Please refer to [facility] for RD and/or RD on-call/weekend/after hours pager

## 2020-11-27 NOTE — TOC Initial Note (Signed)
Transition of Care [facility]) - Initial/Assessment Note    Patient Details  Name: Rachael Humphrey MRN: [id number] Date of Birth: 07-14-1930  Transition of Care [facility]) CM/SW Contact:    Shelbie Hutching, RN Phone Number: 11/27/2020, 3:57 PM  Clinical Narrative:                 Patient placed under observation for L4 compression fracture.  Patient is scheduled for kyphoplasty tomorrow.  RNCM was able to meet with patient at the bedside.  Patient lives alone and is independent and drives.  Patient has DME equipment at home, she does not use it but has it if needed.  Patient has had home health services in the past with [facility] and would like to use them again.  Referral for PT and OT given to [facility] with [facility].   Patient may be able to be discharged after procedure tomorrow.   TOC will cont to follow.   Expected Discharge Plan: [place] Barriers to Discharge: Continued Medical Work up   Patient Goals and CMS Choice Patient states their goals for this hospitalization and ongoing recovery are:: agrees to home health services CMS Medicare.gov Compare Post Acute Care list provided to:: Patient Choice offered to / list presented to : Patient  Expected Discharge Plan and Services Expected Discharge Plan: [place]   Discharge Planning Services: CM Consult Post Acute Care Choice: [place] arrangements for the past 2 months: Single Family Home                 DME Arranged: N/A DME Agency: NA       HH Arranged: PT,OT [place] Agency: [place] ([place]) Date [place]: 11/27/20 Time HH Agency Contacted: 27 Representative spoke with at [place]: Corene Cornea  Prior Living Arrangements/Services Living arrangements for the past 2 months: [place] with:: Self Patient language and need for interpreter reviewed:: Yes Do you feel safe going back to the place where you live?: Yes      Need for Family Participation in  Patient Care: Yes (Comment) (compression fracture) Care giver support system in place?: Yes (comment) (sister, niece) Current home services: DME (has all needed DME at home) Criminal Activity/Legal Involvement Pertinent to Current Situation/Hospitalization: No - Comment as needed  Activities of Daily Living Home Assistive Devices/Equipment: Bedside commode/3-in-1,Eyeglasses,Walker (specify type) ADL Screening (condition at time of admission) Patient's cognitive ability adequate to safely complete daily activities?: Yes Is the patient deaf or have difficulty hearing?: No Does the patient have difficulty seeing, even when wearing glasses/contacts?: No Does the patient have difficulty concentrating, remembering, or making decisions?: No Patient able to express need for assistance with ADLs?: Yes Does the patient have difficulty dressing or bathing?: No Independently performs ADLs?: No Communication: Independent Dressing (OT): Independent Grooming: Independent Feeding: Independent Bathing: Independent Toileting: Needs assistance In/Out Bed: Needs assistance Walks in Home: Needs assistance Does the patient have difficulty walking or climbing stairs?: Yes Weakness of Legs: Both Weakness of Arms/Hands: None  Permission Sought/Granted Permission sought to share information with : Case Manager,Family Supports,Other (comment) Permission granted to share information with : Yes, Verbal Permission Granted  Share Information with NAME: Mariann Laster  Permission granted to share info w AGENCY: [facility]  Permission granted to share info w Relationship: niece     Emotional Assessment Appearance:: Appears stated age Attitude/Demeanor/Rapport: Engaged Affect (typically observed): Accepting Orientation: : Oriented to Place,Oriented to Self,Oriented to  Time,Oriented to Situation  Alcohol / Substance Use: Not Applicable Psych Involvement: No (comment)  Admission diagnosis:  Shortness of breath  [R06.02] Back pain [M54.9] Compression fracture of L4 vertebra, initial encounter [facility]) [S32.040A] Patient Active Problem List   Diagnosis Date Noted  . Back pain 11/26/2020  . Closed compression fracture of L4 lumbar vertebra, initial encounter ([place]) 11/26/2020  . Closed patellar sleeve fracture of right knee 04/08/2019  . Fall 02/12/2017  . Acquired hypothyroidism 10/06/2016  . Arm fracture 10/06/2016  . Benign neoplasm of colon 10/06/2016  . Cervical adenopathy 10/06/2016  . Benign essential hypertension 10/06/2016  . GERD (gastroesophageal reflux disease) 10/06/2016  . Hiatal hernia 10/06/2016  . History of abnormal cervical Pap smear 10/06/2016  . History of TIA (transient ischemic attack) 10/06/2016  . Hyperlipidemia, unspecified 10/06/2016  . Nephrolithiasis 10/06/2016  . Senile osteoporosis 10/06/2016  . Type 2 diabetes mellitus without complication ([place]) 88/91/6945  . Rupture of implant of right breast 09/18/2015  . Chronic back pain 03/08/2014  . Compression fracture 03/08/2014  . Coronary artery disease 03/03/2014  . Wedge compression fracture of T7 vertebra ([place]) 02/22/2014   PCP:  Idelle Crouch, MD Pharmacy:   [id number] - [place], [place] - [address] [address] Phone: [phone] Fax: [phone]     Social Determinants of Health (SDOH) Interventions    Readmission Risk Interventions Readmission Risk Prevention Plan 04/09/2019  Post Dischage Appt Complete  Medication Screening Complete  Transportation Screening Complete  Some recent data might be hidden

## 2020-11-27 NOTE — Progress Notes (Signed)
Progress Note    THAILY HACKWORTH  MVE:[id number] DOB: September 28, 1929  DOA: 11/26/2020 PCP: Idelle Crouch, MD    Brief Narrative:    Medical records reviewed and are as summarized below:  KALEE BROXTON is an 87 y.o. female with medical history significant for hyperlipidemia, hypertension, hypothyroid, GERD, history of compression fracture status post kyphoplasty, presents emergency department for chief concerns of a fall on 3/3.  11/23/2020 MRI of the lumbar spine without contrast revealed subacute L4 compression deformity with mild height loss in 3 to 4 mm retropulsion. Plan for OR on 3/16.  Assessment/Plan:   Principal Problem:   Closed compression fracture of L4 lumbar vertebra, initial encounter [facility]) Active Problems:   Acquired hypothyroidism   Coronary artery disease   Benign essential hypertension   GERD (gastroesophageal reflux disease)   History of TIA (transient ischemic attack)   Hyperlipidemia, unspecified   Type 2 diabetes mellitus without complication (HCC)   Fall   Back pain   Closed compression fracture of the L4 lumbar vertebra Mild spinal canal/neural foraminal narrowing -Decadron 10 mg IV once -schedule tylenol and robaxin -PRN pain meds -Orthopedic provider, Dr. Rudene Christians has been consulted and plans to take patient to the OR for intervention on 11/28/2020 -hold plavix  Acute hypoxic respiratory failure secondary to pain medication -Status post morphine 4 mg per ED provider -incentive spirometry -wean to RA  Hyperlipidemia -atorvastatin 20 mg daily resumed  GERD -Protonix 40 mg daily  Hypothyroid  -levothyroxine 25 mcg daily  Hypertension -resume home meds  CAD-aspirin and Plavix has been held -Last time she took Plavix was Sunday, 11/25/2020  Ecchymosis, fading at the left gluteus down to the mid thigh -Pale green and yellow -No signs of active bleeding- h/h stable -Present on admission    Family Communication/Anticipated D/C date  and plan/Code Status   DVT prophylaxis: heparin ordered in ER Code Status: Full Code.  Family Communication: family at bedside Disposition Plan: Status is: Observation  The patient will require care spanning > 2 midnights and should be moved to inpatient because: IV treatments appropriate due to intensity of illness or inability to take PO  Dispo: The patient is from: Home              Anticipated d/c is to: tbd              Patient currently is not medically stable to d/c. plan to go to the OR on 3/15   Difficult to place patient No         Medical Consultants:    orthopedics     Subjective:   Still feeling "achy" in her back  Objective:    Vitals:   11/27/20 0032 11/27/20 0036 11/27/20 0448 11/27/20 0839  BP:  114/69 (!) 157/97 (!) 148/93  Pulse: 72 68 77 (!) 105  Resp: 16  18 16   Temp: (!) 97.5 F (36.4 C)  97.8 F (36.6 C) 97.8 F (36.6 C)  TempSrc: Oral     SpO2: 99%  97% 98%  Weight:      Height:       No intake or output data in the 24 hours ending 11/27/20 1053 Filed Weights   11/26/20 1104  Weight: 54.4 kg    Exam:  General: Appearance:    Elderly female in no acute distress     Lungs:     respirations unlabored  Heart:    Tachycardic. Normal rhythm. No murmurs, rubs, or gallops.  MS:   All extremities are intact.   Neurologic:   Awake, alert, oriented x 3    Data Reviewed:   I have personally reviewed following labs and imaging studies:  Labs: Labs show the following:   Basic Metabolic Panel: Recent Labs  Lab 11/26/20 1218 11/27/20 0426  NA 139 141  K 3.3* 4.3  CL 111 109  CO2 20* 23  GLUCOSE 143* 131*  BUN 31* 28*  CREATININE 0.65 0.70  CALCIUM 8.7* 9.8   GFR Estimated Creatinine Clearance: 37.7 mL/min (by C-G formula based on SCr of 0.7 mg/dL). Liver Function Tests: No results for input(s): AST, ALT, ALKPHOS, BILITOT, PROT, ALBUMIN in the last 168 hours. No results for input(s): LIPASE, AMYLASE in the last 168  hours. No results for input(s): AMMONIA in the last 168 hours. Coagulation profile No results for input(s): INR, PROTIME in the last 168 hours.  CBC: Recent Labs  Lab 11/26/20 1218 11/27/20 0426  WBC 8.8 9.2  NEUTROABS 5.4  --   HGB 14.1 15.1*  HCT 41.3 43.8  MCV 92.8 92.4  PLT 215 232   Cardiac Enzymes: No results for input(s): CKTOTAL, CKMB, CKMBINDEX, TROPONINI in the last 168 hours. BNP (last 3 results) No results for input(s): PROBNP in the last 8760 hours. CBG: No results for input(s): GLUCAP in the last 168 hours. D-Dimer: No results for input(s): DDIMER in the last 72 hours. Hgb A1c: No results for input(s): HGBA1C in the last 72 hours. Lipid Profile: No results for input(s): CHOL, HDL, LDLCALC, TRIG, CHOLHDL, LDLDIRECT in the last 72 hours. Thyroid function studies: No results for input(s): TSH, T4TOTAL, T3FREE, THYROIDAB in the last 72 hours.  Invalid input(s): FREET3 Anemia work up: No results for input(s): VITAMINB12, FOLATE, FERRITIN, TIBC, IRON, RETICCTPCT in the last 72 hours. Sepsis Labs: Recent Labs  Lab 11/26/20 1218 11/27/20 0426  WBC 8.8 9.2    Microbiology Recent Results (from the past 240 hour(s))  Resp Panel by RT-PCR (Flu A&B, Covid) Nasopharyngeal Swab     Status: None   Collection Time: 11/26/20 12:30 PM   Specimen: Nasopharyngeal Swab; Nasopharyngeal(NP) swabs in vial transport medium  Result Value Ref Range Status   SARS Coronavirus 2 by RT PCR NEGATIVE NEGATIVE Final    Comment: (NOTE) SARS-CoV-2 target nucleic acids are NOT DETECTED.  The SARS-CoV-2 RNA is generally detectable in upper respiratory specimens during the acute phase of infection. The lowest concentration of SARS-CoV-2 viral copies this assay can detect is 138 copies/mL. A negative result does not preclude SARS-Cov-2 infection and should not be used as the sole basis for treatment or other patient management decisions. A negative result may occur with  improper  specimen collection/handling, submission of specimen other than nasopharyngeal swab, presence of viral mutation(s) within the areas targeted by this assay, and inadequate number of viral copies(<138 copies/mL). A negative result must be combined with clinical observations, patient history, and epidemiological information. The expected result is Negative.  Fact Sheet for Patients:  EntrepreneurPulse.com.au  Fact Sheet for Healthcare Providers:  IncredibleEmployment.be  This test is no t yet approved or cleared by the [place] FDA and  has been authorized for detection and/or diagnosis of SARS-CoV-2 by FDA under an Emergency Use Authorization (EUA). This EUA will remain  in effect (meaning this test can be used) for the duration of the COVID-19 declaration under Section 564(b)(1) of the Act, 21 U.S.C.section 360bbb-3(b)(1), unless the authorization is terminated  or revoked sooner.  Influenza A by PCR NEGATIVE NEGATIVE Final   Influenza B by PCR NEGATIVE NEGATIVE Final    Comment: (NOTE) The Xpert Xpress SARS-CoV-2/FLU/RSV plus assay is intended as an aid in the diagnosis of influenza from Nasopharyngeal swab specimens and should not be used as a sole basis for treatment. Nasal washings and aspirates are unacceptable for Xpert Xpress SARS-CoV-2/FLU/RSV testing.  Fact Sheet for Patients: EntrepreneurPulse.com.au  Fact Sheet for Healthcare Providers: IncredibleEmployment.be  This test is not yet approved or cleared by the [place] FDA and has been authorized for detection and/or diagnosis of SARS-CoV-2 by FDA under an Emergency Use Authorization (EUA). This EUA will remain in effect (meaning this test can be used) for the duration of the COVID-19 declaration under Section 564(b)(1) of the Act, 21 U.S.C. section 360bbb-3(b)(1), unless the authorization is terminated or revoked.  Performed at  [facility], [address] [place], [place] [address]     Procedures and diagnostic studies:  Chest Portable 1 View  Result Date: 11/26/2020 CLINICAL DATA:  Oxygen desaturation. EXAM: PORTABLE CHEST 1 VIEW COMPARISON:  April 08, 2019. FINDINGS: The heart size and mediastinal contours are within normal limits. Both lungs are clear. No pneumothorax or pleural effusion is noted. The visualized skeletal structures are unremarkable. IMPRESSION: No active disease. Electronically Signed   By: Marijo Conception M.D.   On: 11/26/2020 13:18    Medications:   . acetaminophen  1,000 mg Oral TID  . atorvastatin  20 mg Oral Daily  . heparin injection (subcutaneous)  5,000 Units Subcutaneous Q8H  . levothyroxine  25 mcg Oral QAC breakfast  . lidocaine  1 patch Transdermal Q24H  . losartan  25 mg Oral Daily  . mouth rinse  15 mL Mouth Rinse BID  . methocarbamol  500 mg Oral TID  . metoprolol succinate  75 mg Oral q morning  . montelukast  10 mg Oral QHS  . multivitamin with minerals  1 tablet Oral Daily  . NIFEdipine  30 mg Oral Daily  . pantoprazole  40 mg Oral Daily   Continuous Infusions: . [START ON 11/28/2020]  ceFAZolin (ANCEF) IV       LOS: 0 days   Geradine Girt  [facility]   How to contact the [facility] Attending or Consulting provider [place] or covering provider during after hours [place], for this patient?  1. Check the care team in [facility] and look for a) attending/consulting [facility] provider listed and b) the [facility] team listed 2. Log into [email or web address] and use [place] universal password to access. If you do not have the password, please contact the hospital operator. 3. Locate the [facility] provider you are looking for under [facility] and page to a number that you can be directly reached. 4. If you still have difficulty reaching the provider, please page the [facility] (Director on Call) for the Hospitalists listed on amion for assistance.  11/27/2020, 10:53 AM

## 2020-11-28 ENCOUNTER — Observation Stay: Payer: Medicare Other

## 2020-11-28 ENCOUNTER — Encounter
Admission: EM | Disposition: A | Discharge status: Home Health | Payer: Self-pay | Source: Home / Self Care | Attending: Emergency Medicine

## 2020-11-28 ENCOUNTER — Observation Stay: Payer: Medicare Other | Admitting: Certified Registered Nurse Anesthetist

## 2020-11-28 ENCOUNTER — Encounter: Payer: Self-pay | Admitting: Internal Medicine

## 2020-11-28 DIAGNOSIS — S32040A Wedge compression fracture of fourth lumbar vertebra, initial encounter for closed fracture: Secondary | ICD-10-CM | POA: Diagnosis not present

## 2020-11-28 HISTORY — PX: KYPHOPLASTY: SHX5884

## 2020-11-28 LAB — GLUCOSE, CAPILLARY: Glucose-Capillary: 147 mg/dL — ABNORMAL HIGH (ref 70–99)

## 2020-11-28 SURGERY — KYPHOPLASTY
Anesthesia: General

## 2020-11-28 MED ORDER — PROPOFOL 500 MG/50ML IV EMUL
INTRAVENOUS | Status: DC | PRN
  Administered 2020-11-28: 65 ug/kg/min via INTRAVENOUS

## 2020-11-28 MED ORDER — DEXMEDETOMIDINE (PRECEDEX) IN NS 20 MCG/5ML (4 MCG/ML) IV SYRINGE
PREFILLED_SYRINGE | INTRAVENOUS | Status: AC
  Filled 2020-11-28: qty 5

## 2020-11-28 MED ORDER — FENTANYL CITRATE (PF) 100 MCG/2ML IJ SOLN
INTRAMUSCULAR | Status: DC | PRN
  Administered 2020-11-28 (×2): 25 ug via INTRAVENOUS
  Administered 2020-11-28: 50 ug via INTRAVENOUS

## 2020-11-28 MED ORDER — PROPOFOL 10 MG/ML IV BOLUS
INTRAVENOUS | Status: DC | PRN
  Administered 2020-11-28: 20 mg via INTRAVENOUS

## 2020-11-28 MED ORDER — SODIUM CHLORIDE 0.9 % IV SOLN: INTRAVENOUS | Status: DC | PRN

## 2020-11-28 MED ORDER — LIDOCAINE HCL (PF) 1 % IJ SOLN
INTRAMUSCULAR | Status: AC
  Filled 2020-11-28: qty 30

## 2020-11-28 MED ORDER — CLOPIDOGREL BISULFATE 75 MG PO TABS
75.0000 mg | ORAL_TABLET | Freq: Every day | ORAL | Status: DC
  Administered 2020-11-29: 75 mg via ORAL
  Filled 2020-11-28: qty 1

## 2020-11-28 MED ORDER — OXYCODONE HCL 5 MG PO TABS: 2.5000 mg | ORAL_TABLET | ORAL | Status: DC | PRN

## 2020-11-28 MED ORDER — CALCITONIN (SALMON) 200 UNIT/ACT NA SOLN
1.0000 | Freq: Every day | NASAL | Status: DC
  Administered 2020-11-29: 1 via NASAL
  Filled 2020-11-28: qty 3.7

## 2020-11-28 MED ORDER — FENTANYL CITRATE (PF) 100 MCG/2ML IJ SOLN
INTRAMUSCULAR | Status: AC
  Filled 2020-11-28: qty 2

## 2020-11-28 MED ORDER — BUPIVACAINE-EPINEPHRINE (PF) 0.5% -1:200000 IJ SOLN
INTRAMUSCULAR | Status: AC
  Filled 2020-11-28: qty 30

## 2020-11-28 MED ORDER — ONDANSETRON HCL 4 MG/2ML IJ SOLN
INTRAMUSCULAR | Status: DC | PRN
  Administered 2020-11-28: 4 mg via INTRAVENOUS

## 2020-11-28 MED ORDER — LIDOCAINE HCL (PF) 2 % IJ SOLN
INTRAMUSCULAR | Status: AC
  Filled 2020-11-28: qty 5

## 2020-11-28 MED ORDER — PHENYLEPHRINE HCL (PRESSORS) 10 MG/ML IV SOLN
INTRAVENOUS | Status: DC | PRN
  Administered 2020-11-28: 100 ug via INTRAVENOUS

## 2020-11-28 MED ORDER — FENTANYL CITRATE (PF) 100 MCG/2ML IJ SOLN
25.0000 ug | INTRAMUSCULAR | Status: DC | PRN
Start: 2020-11-28 — End: 2020-11-28
  Administered 2020-11-28: 25 ug via INTRAVENOUS

## 2020-11-28 MED ORDER — DEXMEDETOMIDINE (PRECEDEX) IN NS 20 MCG/5ML (4 MCG/ML) IV SYRINGE
PREFILLED_SYRINGE | INTRAVENOUS | Status: DC | PRN
  Administered 2020-11-28: 8 ug via INTRAVENOUS
  Administered 2020-11-28: 4 ug via INTRAVENOUS

## 2020-11-28 MED ORDER — LIDOCAINE HCL (CARDIAC) PF 100 MG/5ML IV SOSY
PREFILLED_SYRINGE | INTRAVENOUS | Status: DC | PRN
  Administered 2020-11-28: 50 mg via INTRATRACHEAL

## 2020-11-28 MED ORDER — LIDOCAINE HCL 1 % IJ SOLN
INTRAMUSCULAR | Status: DC | PRN
  Administered 2020-11-28: 30 mL

## 2020-11-28 MED ORDER — BUPIVACAINE-EPINEPHRINE (PF) 0.5% -1:200000 IJ SOLN
INTRAMUSCULAR | Status: DC | PRN
  Administered 2020-11-28: 20 mL

## 2020-11-28 MED ORDER — ONDANSETRON HCL 4 MG/2ML IJ SOLN
INTRAMUSCULAR | Status: AC
  Filled 2020-11-28: qty 2

## 2020-11-28 MED ORDER — ONDANSETRON HCL 4 MG/2ML IJ SOLN: 4.0000 mg | Freq: Once | INTRAMUSCULAR | Status: DC | PRN

## 2020-11-28 SURGICAL SUPPLY — 20 items
CEMENT KYPHON CX01A KIT/MIXER (Cement) ×2 IMPLANT
COVER WAND RF STERILE (DRAPES) ×2 IMPLANT
DERMABOND ADVANCED (GAUZE/BANDAGES/DRESSINGS) ×1
DERMABOND ADVANCED .7 DNX12 (GAUZE/BANDAGES/DRESSINGS) ×1 IMPLANT
DEVICE BIOPSY BONE KYPHX (INSTRUMENTS) ×2 IMPLANT
DRAPE C-ARM XRAY 36X54 (DRAPES) ×2 IMPLANT
DURAPREP 26ML APPLICATOR (WOUND CARE) ×2 IMPLANT
GLOVE SURG SYN 9.0  PF PI (GLOVE) ×1
GLOVE SURG SYN 9.0 PF PI (GLOVE) ×1 IMPLANT
GOWN SRG 2XL LVL 4 RGLN SLV (GOWNS) ×1 IMPLANT
GOWN STRL NON-REIN 2XL LVL4 (GOWNS) ×1
GOWN STRL REUS W/ TWL LRG LVL3 (GOWN DISPOSABLE) ×1 IMPLANT
GOWN STRL REUS W/TWL LRG LVL3 (GOWN DISPOSABLE) ×1
MANIFOLD NEPTUNE II (INSTRUMENTS) ×2 IMPLANT
PACK KYPHOPLASTY (MISCELLANEOUS) ×2 IMPLANT
RENTAL RFA GENERATOR (MISCELLANEOUS) IMPLANT
STRAP SAFETY 5IN WIDE (MISCELLANEOUS) ×2 IMPLANT
SWABSTK COMLB BENZOIN TINCTURE (MISCELLANEOUS) ×2 IMPLANT
TRAY KYPHOPAK 15/3 EXPRESS 1ST (MISCELLANEOUS) ×2 IMPLANT
TRAY KYPHOPAK 20/3 EXPRESS 1ST (MISCELLANEOUS) IMPLANT

## 2020-11-28 NOTE — Progress Notes (Signed)
[facility] PROGRESS NOTE    Rachael Humphrey  NWG:[id number] DOB: [birth year] DOA: 11/26/2020 PCP: Idelle Crouch, MD      Brief Narrative:  Rachael Humphrey is a 87 y.o. F with HTN, hypothyroidism hx colon cancer, DM, and TIA as well as hx vertebral compression fx with kyphoplasty who presented with fall and back pain.   In the ER, MRI lumbar spine showed new L4 compression fracture.  Orthopedics were consulted and recommended kyphoplasty for pain control, to be done on 3/16.        Assessment & Plan:  L4 vertebral compression fracture  -Consult Orthopedisc for kyphoplasty  -Hold Plavix for kyphoplasty -Scheduled acetaminophen -Start calcitonin  -Low dose oxycodone    Acute metabolic encephalopathy Patient confused and sedated this morning.  This appears to be from a combination of pain and Robaxin given this morning. -Stop Robaxin -Avoid morphine   Acute hypoxic respiratory failure ruled out Hypoxia due to bradynpnea due to opiates.  Now resolved.   Hypothyroidism -Continue levothyroxine   Hypertension Cerebrovascular disease, secondary prevention BP mostly controlled -Continue losartan, metoprolol, nifedipine -Continue atorvastatin -Hold Plavix  GERD -Continue PPI  Asthma -Continue Singulair  Hypokalemia Resolved           Disposition: Status is: Observation  The patient remains OBS appropriate and will d/c before 2 midnights.  Dispo: The patient is from: Home              Anticipated d/c is to: Home              Patient currently is not medically stable to d/c.   Difficult to place patient No    Patient admitted with a compression fracture.  Given her age 89, continued severe pain and difficulty in achieving pain control without causing confusion/delirium/encephaloapthy, she will likely need observation overnight after kyphoplasty before discharge home.    Of note, patient initially sustained her fracture in the ER,  was observed overnight there, and discharged home, failed at home, was seen again in the ER, and twice at her PCPs office and still returned to the hospital.  Given that, I think discharge tonight runs an unecesary risk of readissiom.      Level of care: Med-Surg       MDM: The below labs and imaging reports were reviewed and summarized above.  Medication management as above.   DVT prophylaxis: Place TED hose Start: 11/26/20 1256  Code Status: FULL Family Communication: Sister at bedside    Consultants:   Orthopedics  Procedures:   3/11 MRI lumbar spine -- L4 cpmpression fracture, some retropulsion with some foraminal narrowing  3/16 kyphoplasty by Dr. Rudene Christians             Subjective: No fever.  No vomiting, no headache.  Back pain is severe.  Still with occasional pain radiating down right leg.  Somewhat confused, and doesn't know where she is this morning.  Doesn't know her last name.   Objective: Vitals:   11/28/20 0351 11/28/20 0830 11/28/20 1151 11/28/20 1441  BP: (!) 163/95 (!) 145/94 137/90 138/86  Pulse: 82 81 74 79  Resp: 18 15 14 16   Temp: 97.9 F (36.6 C) 98.3 F (36.8 C) 97.7 F (36.5 C) 98 F (36.7 C)  TempSrc: Oral Oral Oral Tympanic  SpO2: 95% 95% 95% 96%  Weight:    54.4 kg  Height:    5\' 1"  (1.549 m)    Intake/Output Summary (Last 24 hours) at  11/28/2020 1519 Last data filed at 11/28/2020 0351 Gross per 24 hour  Intake --  Output 225 ml  Net -225 ml   Filed Weights   11/26/20 1104 11/28/20 1441  Weight: 54.4 kg 54.4 kg    Examination: General appearance: Elderly adult female, alert and in no acute distress.lying in bed.   HEENT: Anicteric, conjunctiva pink, lids and lashes normal. No nasal deformity, discharge, epistaxis.  Lips moist, dentition in good repair.   Skin: Warm and dry.  No jaundice.  No suspicious rashes or lesions. Cardiac: RRR, nl S1-S2, no murmurs appreciated.  Capillary refill is brisk.  JVP normal.  No LE  edema.  Radial pulses 2+ and symmetric. Respiratory: Normal respiratory rate and rhythm.  CTAB without rales or wheezes. Abdomen: Abdomen soft.  No TTP. No ascites, distension, hepatosplenomegaly.   MSK: No deformities or effusions.  No deformity of right leg.  No swelling.  No redness. Neuro: Awake but sleepy and confused.  EOMI, moves all extremities with severe generalized weakness. Speech fluent but slow Psych: Sensorium intact and responding to questions, attention diminished, psychomotor slowing noted, affect blunted   Judgment and insight appear impaired.    Data Reviewed: I have personally reviewed following labs and imaging studies:  CBC: Recent Labs  Lab 11/26/20 1218 11/27/20 0426  WBC 8.8 9.2  NEUTROABS 5.4  --   HGB 14.1 15.1*  HCT 41.3 43.8  MCV 92.8 92.4  PLT 215 073   Basic Metabolic Panel: Recent Labs  Lab 11/26/20 1218 11/27/20 0426  NA 139 141  K 3.3* 4.3  CL 111 109  CO2 20* 23  GLUCOSE 143* 131*  BUN 31* 28*  CREATININE 0.65 0.70  CALCIUM 8.7* 9.8   GFR: Estimated Creatinine Clearance: 36 mL/min (by C-G formula based on SCr of 0.7 mg/dL). Liver Function Tests: No results for input(s): AST, ALT, ALKPHOS, BILITOT, PROT, ALBUMIN in the last 168 hours. No results for input(s): LIPASE, AMYLASE in the last 168 hours. No results for input(s): AMMONIA in the last 168 hours. Coagulation Profile: No results for input(s): INR, PROTIME in the last 168 hours. Cardiac Enzymes: No results for input(s): CKTOTAL, CKMB, CKMBINDEX, TROPONINI in the last 168 hours. BNP (last 3 results) No results for input(s): PROBNP in the last 8760 hours. HbA1C: No results for input(s): HGBA1C in the last 72 hours. CBG: No results for input(s): GLUCAP in the last 168 hours. Lipid Profile: No results for input(s): CHOL, HDL, LDLCALC, TRIG, CHOLHDL, LDLDIRECT in the last 72 hours. Thyroid Function Tests: No results for input(s): TSH, T4TOTAL, FREET4, T3FREE, THYROIDAB in the  last 72 hours. Anemia Panel: No results for input(s): VITAMINB12, FOLATE, FERRITIN, TIBC, IRON, RETICCTPCT in the last 72 hours. Urine analysis:    Component Value Date/Time   COLORURINE YELLOW (A) 11/26/2020 1711   APPEARANCEUR HAZY (A) 11/26/2020 1711   APPEARANCEUR Clear 04/01/2014 0246   LABSPEC 1.035 (H) 11/26/2020 1711   LABSPEC 1.006 04/01/2014 0246   PHURINE 5.0 11/26/2020 1711   GLUCOSEU NEGATIVE 11/26/2020 1711   GLUCOSEU >=500 04/01/2014 0246   HGBUR NEGATIVE 11/26/2020 1711   BILIRUBINUR NEGATIVE 11/26/2020 1711   BILIRUBINUR Negative 04/01/2014 0246   KETONESUR NEGATIVE 11/26/2020 1711   PROTEINUR 30 (A) 11/26/2020 1711   NITRITE NEGATIVE 11/26/2020 1711   LEUKOCYTESUR LARGE (A) 11/26/2020 1711   LEUKOCYTESUR Negative 04/01/2014 0246   Sepsis Labs: @LABRCNTIP (procalcitonin:4,lacticacidven:4)  ) Recent Results (from the past 240 hour(s))  Resp Panel by RT-PCR (Flu A&B, Covid) Nasopharyngeal Swab  Status: None   Collection Time: 11/26/20 12:30 PM   Specimen: Nasopharyngeal Swab; Nasopharyngeal(NP) swabs in vial transport medium  Result Value Ref Range Status   SARS Coronavirus 2 by RT PCR NEGATIVE NEGATIVE Final    Comment: (NOTE) SARS-CoV-2 target nucleic acids are NOT DETECTED.  The SARS-CoV-2 RNA is generally detectable in upper respiratory specimens during the acute phase of infection. The lowest concentration of SARS-CoV-2 viral copies this assay can detect is 138 copies/mL. A negative result does not preclude SARS-Cov-2 infection and should not be used as the sole basis for treatment or other patient management decisions. A negative result may occur with  improper specimen collection/handling, submission of specimen other than nasopharyngeal swab, presence of viral mutation(s) within the areas targeted by this assay, and inadequate number of viral copies(<138 copies/mL). A negative result must be combined with clinical observations, patient history,  and epidemiological information. The expected result is Negative.  Fact Sheet for Patients:  EntrepreneurPulse.com.au  Fact Sheet for Healthcare Providers:  IncredibleEmployment.be  This test is no t yet approved or cleared by the [place] FDA and  has been authorized for detection and/or diagnosis of SARS-CoV-2 by FDA under an Emergency Use Authorization (EUA). This EUA will remain  in effect (meaning this test can be used) for the duration of the COVID-19 declaration under Section 564(b)(1) of the Act, 21 U.S.C.section 360bbb-3(b)(1), unless the authorization is terminated  or revoked sooner.       Influenza A by PCR NEGATIVE NEGATIVE Final   Influenza B by PCR NEGATIVE NEGATIVE Final    Comment: (NOTE) The Xpert Xpress SARS-CoV-2/FLU/RSV plus assay is intended as an aid in the diagnosis of influenza from Nasopharyngeal swab specimens and should not be used as a sole basis for treatment. Nasal washings and aspirates are unacceptable for Xpert Xpress SARS-CoV-2/FLU/RSV testing.  Fact Sheet for Patients: EntrepreneurPulse.com.au  Fact Sheet for Healthcare Providers: IncredibleEmployment.be  This test is not yet approved or cleared by the [place] FDA and has been authorized for detection and/or diagnosis of SARS-CoV-2 by FDA under an Emergency Use Authorization (EUA). This EUA will remain in effect (meaning this test can be used) for the duration of the COVID-19 declaration under Section 564(b)(1) of the Act, 21 U.S.C. section 360bbb-3(b)(1), unless the authorization is terminated or revoked.  Performed at [facility], [address], [place], [place] [address]          Radiology Studies: No results found.      Scheduled Meds: . [MAR Hold] acetaminophen  1,000 mg Oral TID  . [MAR Hold] atorvastatin  20 mg Oral Daily  . [MAR Hold] calcitonin (salmon)  1 spray  Alternating Nares Daily  . [MAR Hold] feeding supplement  237 mL Oral TID BM  . [MAR Hold] levothyroxine  25 mcg Oral QAC breakfast  . [MAR Hold] lidocaine  1 patch Transdermal Q24H  . [MAR Hold] losartan  25 mg Oral Daily  . [MAR Hold] mouth rinse  15 mL Mouth Rinse BID  . [MAR Hold] metoprolol succinate  75 mg Oral q morning  . [MAR Hold] montelukast  10 mg Oral QHS  . [MAR Hold] multivitamin with minerals  1 tablet Oral Daily  . [MAR Hold] NIFEdipine  30 mg Oral Daily  . [MAR Hold] pantoprazole  40 mg Oral Daily   Continuous Infusions: . [MAR Hold]  ceFAZolin (ANCEF) IV       LOS: 0 days    Time spent: 25 minutes  Edwin Dada, MD [facility] 11/28/2020, 3:19 PM     Please page though [place] or Epic secure chat:  For [employer], contact charge nurse     Estimated body mass index is 22.67 kg/m as calculated from the following:   Height as of this encounter: 5\' 1"  (1.549 m).   Weight as of this encounter: 54.4 kg. Malnutrition Type: Nutrition Problem: Inadequate oral intake Etiology: decreased appetite Malnutrition Characteristics: Signs/Symptoms: meal completion < 50% Nutrition Interventions: Interventions: Ensure Enlive (each supplement provides 350kcal and 20 grams of protein),MVI    . [MAR Hold] acetaminophen  1,000 mg Oral TID  . [MAR Hold] atorvastatin  20 mg Oral Daily  . [MAR Hold] calcitonin (salmon)  1 spray Alternating Nares Daily  . [MAR Hold] feeding supplement  237 mL Oral TID BM  . [MAR Hold] levothyroxine  25 mcg Oral QAC breakfast  . [MAR Hold] lidocaine  1 patch Transdermal Q24H  . [MAR Hold] losartan  25 mg Oral Daily  . [MAR Hold] mouth rinse  15 mL Mouth Rinse BID  . [MAR Hold] metoprolol succinate  75 mg Oral q morning  . [MAR Hold] montelukast  10 mg Oral QHS  . [MAR Hold] multivitamin with minerals  1 tablet Oral Daily  . [MAR Hold] NIFEdipine  30 mg Oral Daily  . [MAR Hold] pantoprazole  40 mg Oral Daily   . [MAR  Hold]  ceFAZolin (ANCEF) IV      [MAR Hold] fluticasone, [MAR Hold] ondansetron (ZOFRAN) IV, [MAR Hold] oxyCODONE   Current Meds  Medication Sig  . atorvastatin (LIPITOR) 20 MG tablet Take 20 mg by mouth daily. Reported on 09/18/2015  . baclofen (LIORESAL) 10 MG tablet Take 10 mg by mouth 3 (three) times daily as needed for muscle spasms.  . Calcium Carb-Cholecalciferol (CALCIUM 600 + D PO) Take 1 tablet by mouth daily.  . cetirizine (ZYRTEC) 10 MG tablet Take 10 mg by mouth daily.  . clopidogrel (PLAVIX) 75 MG tablet Take 75 mg by mouth daily.   Marland Kitchen levothyroxine (SYNTHROID, LEVOTHROID) 25 MCG tablet Take 25 mcg by mouth daily before breakfast.   . metoprolol succinate (TOPROL-XL) 50 MG 24 hr tablet Take 75 mg by mouth every morning.   . montelukast (SINGULAIR) 10 MG tablet Take 10 mg by mouth at bedtime.   . Multiple Vitamin (MULTIVITAMIN WITH MINERALS) TABS tablet Take 1 tablet by mouth daily.  . Multiple Vitamins-Minerals (PRESERVISION AREDS 2 PO) Take 1 tablet by mouth 2 (two) times daily.   Marland Kitchen NIFEdipine (PROCARDIA-XL/NIFEDICAL-XL) 30 MG 24 hr tablet Take 30 mg by mouth daily.  . pantoprazole (PROTONIX) 40 MG tablet Take 40 mg by mouth daily.

## 2020-11-28 NOTE — Progress Notes (Signed)
PT Cancellation Note  Patient Details Name: Rachael Humphrey MRN: [id number] DOB: [birth year]   Cancelled Treatment:     PT attempt pt off floor for surgery. Please place continue upon transfer orders if you would like PT to continue to treat post surgery.   Willette Pa 11/28/2020, 2:38 PM

## 2020-11-28 NOTE — Transfer of Care (Signed)
Immediate Anesthesia Transfer of Care Note  Patient: Rachael Humphrey  Procedure(s) Performed: Ronna Polio  USE VASCULAR BED (N/A )  Patient Location: PACU  Anesthesia Type:General  Level of Consciousness: awake  Airway & Oxygen Therapy: Patient connected to face mask oxygen  Post-op Assessment: Post -op Vital signs reviewed and stable  Post vital signs: stable  Last Vitals:  Vitals Value Taken Time  BP    Temp    Pulse 78 11/28/20 1625  Resp 10 11/28/20 1625  SpO2 99 % 11/28/20 1625  Vitals shown include unvalidated device data.  Last Pain:  Vitals:   11/28/20 1441  TempSrc: Tympanic  PainSc: 0-No pain      Patients Stated Pain Goal: 0 (30/86/57 8469)  Complications: No complications documented.

## 2020-11-28 NOTE — Anesthesia Preprocedure Evaluation (Signed)
Anesthesia Evaluation  Patient identified by MRN, date of birth, ID band Patient awake    Reviewed: Allergy & Precautions, H&P , NPO status , Patient's Chart, lab work & pertinent test results  History of Anesthesia Complications (+) PONVNegative for: history of anesthetic complications  Airway Mallampati: II  TM Distance: >3 FB     Dental  (+) Teeth Intact   Pulmonary neg pulmonary ROS, neg sleep apnea, neg COPD,    breath sounds clear to auscultation       Cardiovascular hypertension, (-) angina+ CAD, + Past MI and + Cardiac Stents  (-) dysrhythmias  Rhythm:regular Rate:Normal     Neuro/Psych TIAnegative psych ROS   GI/Hepatic Neg liver ROS, GERD  Controlled,  Endo/Other  diabetesHypothyroidism   Renal/GU negative Renal ROS  negative genitourinary   Musculoskeletal   Abdominal   Peds  Hematology negative hematology ROS (+)   Anesthesia Other Findings Past Medical History: 2005: Cancer ([place])     Comment:  colon cancer No date: Colon cancer ([place]) No date: Complication of anesthesia No date: Coronary artery disease No date: Diabetes mellitus without complication ([facility]) No date: GERD (gastroesophageal reflux disease) No date: H/O hiatal hernia No date: Hemorrhoids     Comment:  internal-not a problem now. No date: History of kidney stones     Comment:  multiple No date: History of shingles     Comment:  04-18-14 3 weeks now -pain only left posterior back , no               outbreaks No date: Hypertension No date: Hypothyroidism No date: Myocardial infarction ([place])     Comment:  2 months ago, stent placed [facility] No date: PONV (postoperative nausea and vomiting)     Comment:  no recent problems No date: Stroke [facility])     Comment:  TIA  Past Surgical History: No date: APPENDECTOMY 1978: AUGMENTATION MAMMAPLASTY; Bilateral No date: BREAST BIOPSY; Right     Comment:  over 30 years ago negative No date: BREAST  BIOPSY; Left     Comment:  negative over 30 years ago No date: CATARACT EXTRACTION, BILATERAL No date: COLECTOMY     Comment:  partial small and large instestine removed No date: CORONARY ANGIOPLASTY WITH STENT PLACEMENT     Comment:  02-23-14 coranary stent placed. 05/17/2014: CYSTOSCOPY WITH RETROGRADE PYELOGRAM, URETEROSCOPY AND  STENT PLACEMENT; Bilateral     Comment:  Procedure: CYSTOSCOPY WITH RETROGRADE PYELOGRAM,               URETEROSCOPY AND STENT PLACEMENT, BASKET STONE REMOVAL;                Surgeon: Alexis Frock, MD;  Location: [facility];  Service:              Urology;  Laterality: Bilateral; 11/25/2018: KYPHOPLASTY; N/A     Comment:  Procedure: KYPHOPLASTY T11, DIABETIC;  Surgeon: Hessie Knows, MD;  Location: [facility];  Service: Orthopedics;               Laterality: N/A;  BMI    Body Mass Index: 22.67 kg/m      Reproductive/Obstetrics negative OB ROS                             Anesthesia Physical Anesthesia Plan  ASA: III  Anesthesia Plan: General   Post-op Pain  Management:    Induction:   PONV Risk Score and Plan: Propofol infusion and TIVA  Airway Management Planned: Simple Face Mask  Additional Equipment:   Intra-op Plan:   Post-operative Plan:   Informed Consent: I have reviewed the patients History and Physical, chart, labs and discussed the procedure including the risks, benefits and alternatives for the proposed anesthesia with the patient or authorized representative who has indicated his/her understanding and acceptance.     Dental Advisory Given  Plan Discussed with: Anesthesiologist, CRNA and Surgeon  Anesthesia Plan Comments:         Anesthesia Quick Evaluation

## 2020-11-28 NOTE — Plan of Care (Signed)
  Problem: Education: Goal: Knowledge of General Education information will improve Description Including pain rating scale, medication(s)/side effects and non-pharmacologic comfort measures Outcome: Progressing   

## 2020-11-28 NOTE — Op Note (Signed)
11/28/2020  4:31 PM  PATIENT:  Rachael Humphrey   MRN: [id number]   PRE-OPERATIVE DIAGNOSIS:  closed wedge compression fracture of L4   POST-OPERATIVE DIAGNOSIS:  closed wedge compression fracture of L4   PROCEDURE:  Procedure(s): KYPHOPLASTY L4  SURGEON: Laurene Footman, MD   ASSISTANTS: None   ANESTHESIA:   local and MAC   EBL:  No intake/output data recorded.   BLOOD ADMINISTERED:none   DRAINS: none    LOCAL MEDICATIONS USED:  MARCAINE    and XYLOCAINE    SPECIMEN:   None   DISPOSITION OF SPECIMEN:  Not applicable   COUNTS:  YES   TOURNIQUET:  * No tourniquets in log *   IMPLANTS: Bone cement   DICTATION: .Dragon Dictation  patient was brought to the operating room and after adequate anesthesia was obtained the patient was placed prone.  C arm was brought in in good visualization of the affected level obtained on both AP and lateral projections.  After patient identification and timeout procedures were completed, local anesthetic was infiltrated with 10 cc 1% Xylocaine infiltrated subcutaneously.  This is done the area on the each side of the planned approach.  The back was then prepped and draped in the usual sterile manner and repeat timeout procedure carried out.  A spinal needle was brought down to the pedicle on the each side of  L4 and a 50-50 mix of 1% Xylocaine half percent Sensorcaine with epinephrine total of 20 cc injected on each side.  After allowing this to set a small incision was made and the trocar was advanced into the vertebral body in an extrapedicular fashion.  Biopsy was not obtained despite attempt Drilling was carried out balloon inserted with inflation to  for cc on the right and across the midline so second side was not addressed.  When the cement was appropriate consistency 7-1/2 cc were injected on the right  into the vertebral body without extravasation, good fill superior to inferior endplates and from right to left sides along the inferior endplate.   After the cement had set the trochar was removed and permanent C-arm views obtained.  The wound was closed with Dermabond followed by Band-Aid   PLAN OF CARE:  Continues observation   PATIENT DISPOSITION:  PACU - hemodynamically stable.

## 2020-11-29 ENCOUNTER — Encounter: Payer: Self-pay | Admitting: Orthopedic Surgery

## 2020-11-29 DIAGNOSIS — S32040A Wedge compression fracture of fourth lumbar vertebra, initial encounter for closed fracture: Secondary | ICD-10-CM | POA: Diagnosis not present

## 2020-11-29 MED ORDER — OXYCODONE HCL 5 MG PO TABS: 2.5000 mg | ORAL_TABLET | Freq: Four times a day (QID) | ORAL | 0 refills | Status: AC | PRN

## 2020-11-29 MED ORDER — CALCITONIN (SALMON) 200 UNIT/ACT NA SOLN: 1.0000 | Freq: Every day | NASAL | 0 refills | Status: AC

## 2020-11-29 NOTE — Progress Notes (Signed)
Patient was A/O x3 yesterday. She was alert to everything but time. She has increased confusion this morning. She is trying to get out of bed, she thinks we are trying to murder her here at the hospital. Tried to look at her IV and she swung at me. Patient is also telling her niece that she thinks she is the devil. She stated that we were all being sneaky and we are trying different things on her. Made MD aware.

## 2020-11-29 NOTE — Discharge Summary (Signed)
Physician Discharge Summary  DELESA KAWA GYI:[id number] DOB: 08/24/1930 DOA: 11/26/2020  PCP: Idelle Crouch, MD  Admit date: 11/26/2020 Discharge date: 11/29/2020  Admitted From: Home  Disposition:  Home with [facility] [place]   Recommendations for Outpatient Follow-up:  1. Follow up with PCP Dr. Doy Hutching in 1 week 2. Follow up with [facility] in 2 weeks   Home Health: PT/OT due to pain with ambulation  Equipment/Devices: None new  Discharge Condition: Fair  CODE STATUS: FULL Diet recommendation: Diabetic      Brief/Interim Summary: Rachael Humphrey is a 87 y.o. F with HTN, hypothyroidism hx colon cancer, DM, and TIA as well as hx vertebral compression fx with kyphoplasty who presented with fall and back pain.   In the ER, MRI lumbar spine showed new L4 compression fracture.  Orthopedics were consulted and recommended kyphoplasty for pain control, to be done on 3/16.        PRINCIPAL HOSPITAL DIAGNOSIS: L4 vertebral compression fracture    Discharge Diagnoses:   L4 vertebral compression fracture  Patient admitted and started on oral opiates.  Orthopedics were consulted and she underwent kyphoplasty on 3/50 without complication.  Pain improved on 3/17, mobility improved.  Stable for dischage    Schedule Tylenol.  Low dose oxycodone PRN.  Calcitonin for 2 weeks if can afford.      Acute metabolic encephalopathy The patient had some occasional agitated disorientation, mild delirium, expected given age, pain, sedating analgesics and anesthesia.  She had no other underlying infectious or metabolic drivers, and would benefit most from return to home environment.    Hypothyroidism Continue levothyroxine   Hypertension Cerebrovascular disease, secondary prevention Continue losartan, metoprolol, nifedipine. Continue atorvastatin. Resume Plavix.  Does not need aspirin.    Asthma No flare.  Hypokalemia Resolved with treatment  Acute hypoxic respiratory  failure ruled out Hypoxia due to bradynpnea due to opiates.  Now resolved.            Discharge Instructions  Discharge Instructions    Diet - low sodium heart healthy   Complete by: As directed    Discharge instructions   Complete by: As directed    For the back pain frm the compression fracture: Take acetaminophen 1000 mg (two tabs) three times daily for 1-2 weeks Take oxycodone 2.5 mg (half tab) up to three times per day as needed Be sparing with this, and use Miralax daily if you take oxycodone as oxycodone will make you constipated  Also, take calcitonin nasal spray once daily in the nose for 2 weeks then stop If calcitonin is extremely expensive, you may defer this medicine  Go see Dr. Doy Hutching as soon as you can   Increase activity slowly   Complete by: As directed    No wound care   Complete by: As directed      Allergies as of 11/29/2020      Reactions   Codeine Nausea And Vomiting   Darvon [propoxyphene] Other (See Comments)   Altered mental status    Demerol [meperidine] Other (See Comments)   Altered mental status    Gabapentin Other (See Comments)   Sulfa Antibiotics Other (See Comments)   Tramadol Other (See Comments)   "makes me crazy"  "makes me crazy"    Voltaren [diclofenac Sodium] Other (See Comments)   "Makes me crazy" "Makes me crazy"   Zinc    Hurting in lower stomach       Medication List    STOP taking these medications  aspirin 325 MG tablet   predniSONE 10 MG tablet Commonly known as: DELTASONE     TAKE these medications   atorvastatin 20 MG tablet Commonly known as: LIPITOR Take 20 mg by mouth daily. Reported on 09/18/2015   baclofen 10 MG tablet Commonly known as: LIORESAL Take 10 mg by mouth 3 (three) times daily as needed for muscle spasms.   calcitonin (salmon) 200 UNIT/ACT nasal spray Commonly known as: MIACALCIN/FORTICAL Place 1 spray into alternate nostrils daily for 13 days.   CALCIUM 600 + D PO Take 1 tablet  by mouth daily.   cetirizine 10 MG tablet Commonly known as: ZYRTEC Take 10 mg by mouth daily.   clopidogrel 75 MG tablet Commonly known as: PLAVIX Take 75 mg by mouth daily.   fluticasone 50 MCG/ACT nasal spray Commonly known as: FLONASE Place 2 sprays into both nostrils daily as needed for allergies.   levothyroxine 25 MCG tablet Commonly known as: SYNTHROID Take 25 mcg by mouth daily before breakfast.   lidocaine 5 % Commonly known as: Lidoderm Place 1 patch onto the skin daily. Remove & Discard patch within 12 hours or as directed by MD   metoprolol succinate 50 MG 24 hr tablet Commonly known as: TOPROL-XL Take 75 mg by mouth every morning.   montelukast 10 MG tablet Commonly known as: SINGULAIR Take 10 mg by mouth at bedtime.   multivitamin with minerals Tabs tablet Take 1 tablet by mouth daily.   mupirocin ointment 2 % Commonly known as: BACTROBAN Place 1 application into the nose 2 (two) times daily.   NIFEdipine 30 MG 24 hr tablet Commonly known as: PROCARDIA-XL/NIFEDICAL-XL Take 30 mg by mouth daily.   oxyCODONE 5 MG immediate release tablet Commonly known as: Oxy IR/ROXICODONE Take 0.5 tablets (2.5 mg total) by mouth every 6 (six) hours as needed for up to 5 days for moderate pain or severe pain.   pantoprazole 40 MG tablet Commonly known as: PROTONIX Take 40 mg by mouth daily.   PRESERVISION AREDS 2 PO Take 1 tablet by mouth 2 (two) times daily.       Follow-up Information    Idelle Crouch, MD. Schedule an appointment as soon as possible for a visit in 1 week(s).   Specialty: Internal Medicine Contact information: [facility] [place] [address] [phone]        Duanne Guess, PA-C Follow up in 2 week(s).   Specialties: Orthopedic Surgery, Emergency Medicine Why: for L-spine xrays Contact information: [place] [address] [phone]              Allergies  Allergen  Reactions  . Codeine Nausea And Vomiting  . Darvon [Propoxyphene] Other (See Comments)    Altered mental status    . Demerol [Meperidine] Other (See Comments)    Altered mental status    . Gabapentin Other (See Comments)  . Sulfa Antibiotics Other (See Comments)  . Tramadol Other (See Comments)    "makes me crazy"  "makes me crazy"   . Voltaren [Diclofenac Sodium] Other (See Comments)    "Makes me crazy" "Makes me crazy"  . Zinc     Hurting in lower stomach     Consultations:  Orthopedics   Procedures/Studies: DG Lumbar Spine 2-3 Views  Result Date: 11/28/2020 CLINICAL DATA:  Lumbar surgery. EXAM: LUMBAR SPINE - 2-3 VIEW; DG C-ARM 1-60 MIN Radiation exposure index: 5.1 mGy. COMPARISON:  November 23, 2020. FINDINGS: Two intraoperative fluoroscopic images  of the lumbar spine were obtained during L4 kyphoplasty. IMPRESSION: Fluoroscopic guidance provided during L4 kyphoplasty. Electronically Signed   By: Marijo Conception M.D.   On: 11/28/2020 16:43   DG Lumbar Spine Complete  Result Date: 11/15/2020 CLINICAL DATA:  Low back pain fall EXAM: LUMBAR SPINE - COMPLETE 4+ VIEW COMPARISON:  01/25/2014 FINDINGS: Levoscoliosis. Diffuse aortic atherosclerosis. Treated compression deformity at T11 and T12. Possible subtle deformity superior endplate L4. Advanced degenerative changes at L5-S1. Facet degenerative changes of the lower lumbar spine. IMPRESSION: 1. Possible subtle superior endplate deformity at L4. 2. Treated compression fractures at T11 and T12. Electronically Signed   By: Donavan Foil M.D.   On: 11/15/2020 17:47   DG Knee 2 Views Right  Result Date: 11/15/2020 CLINICAL DATA:  Fall with knee pain EXAM: RIGHT KNEE - 1-2 VIEW COMPARISON:  04/08/2019 FINDINGS: Old fracture deformity of the patella. No acute displaced fracture or malalignment. No significant knee effusion. Vascular calcifications. IMPRESSION: Old fracture deformity of the patella. No acute osseous abnormality.  Electronically Signed   By: Donavan Foil M.D.   On: 11/15/2020 17:44   DG Tibia/Fibula Right  Result Date: 11/15/2020 CLINICAL DATA:  Proximal right lower leg pain EXAM: RIGHT TIBIA AND FIBULA - 2 VIEW COMPARISON:  11/15/2020 COVID 04/08/2019 FINDINGS: Frontal and lateral views of the right tibia and fibula are obtained. There are no acute displaced fractures. There is mild osteoarthritis of the right knee and ankle. Alignment is anatomic. Soft tissues are unremarkable. IMPRESSION: 1. Osteoarthritis.  No acute fracture. Electronically Signed   By: Randa Ngo M.D.   On: 11/15/2020 19:48   CT Head Wo Contrast  Result Date: 11/19/2020 CLINICAL DATA:  Head trauma.  Recent fall. EXAM: CT HEAD WITHOUT CONTRAST TECHNIQUE: Contiguous axial images were obtained from the base of the skull through the vertex without intravenous contrast. COMPARISON:  Head MRI 04/08/2017 FINDINGS: Brain: There is no evidence of an acute infarct, intracranial hemorrhage, mass, midline shift, or extra-axial fluid collection. Hypodensities in the cerebral white matter bilaterally are nonspecific but compatible with mild chronic small vessel ischemic disease. Small chronic infarcts are again noted in the left thalamus and left cerebellum. Generalized cerebral atrophy is mild for age. A 2-2.5 cm left frontal parasagittal arachnoid cyst is unchanged. Vascular: Calcified atherosclerosis at the skull base. No hyperdense vessel. Skull: No fracture or suspicious osseous lesion. Sinuses/Orbits: Chronic right maxillary sinusitis, incompletely imaged. Clear mastoid air cells. Bilateral cataract extraction. Other: None. IMPRESSION: 1. No evidence of acute intracranial abnormality. 2. Mild chronic small vessel ischemic disease. Electronically Signed   By: Logan Bores M.D.   On: 11/19/2020 17:25   CT PELVIS WO CONTRAST  Result Date: 11/19/2020 CLINICAL DATA:  Right hip pain after a fall. EXAM: CT PELVIS WITHOUT CONTRAST TECHNIQUE: Multidetector  CT imaging of the pelvis was performed following the standard protocol without intravenous contrast. COMPARISON:  Tip x-ray 11/15/2020. FINDINGS: Urinary Tract:  No abnormality visualized. Bowel: Surgical changes noted in the right colon. No bowel dilatation within the visualized lower abdomen and pelvis. Diverticular changes are noted in the left colon without evidence of diverticulitis. Vascular/Lymphatic: Atherosclerotic calcification noted distal aorta. Reproductive: Pessary is noted in the vagina. There is no adnexal mass. Other:  No intraperitoneal free fluid. Musculoskeletal: Small paraumbilical and infraumbilical ventral hernias contain only fat. Bones are diffusely demineralized. No femoral neck fracture. No sacral or pubic ramus fracture. No evidence for hematoma in the muscles or soft tissues of the pelvis/hips. IMPRESSION: 1.  No evidence for acute fracture in the right hip or pelvis. Bones are diffusely demineralized. 2. Small paraumbilical and infraumbilical ventral hernias contain only fat. 3. Left colonic diverticulosis without diverticulitis. 4. Aortic Atherosclerosis (ICD10-I70.0). Electronically Signed   By: Misty Stanley M.D.   On: 11/19/2020 17:30   MR LUMBAR SPINE WO CONTRAST  Result Date: 11/23/2020 CLINICAL DATA:  Abnormal x-ray, lower back pain and prior fall. EXAM: MRI LUMBAR SPINE WITHOUT CONTRAST TECHNIQUE: Multiplanar, multisequence MR imaging of the lumbar spine was performed. No intravenous contrast was administered. COMPARISON:  11/15/2020 and prior. FINDINGS: Segmentation: Transitional lumbosacral anatomy with S1 lumbarization. Alignment: Trace L1-2 retrolisthesis. Trace grade 1 anterolisthesis at the L3-4, L4-5 level. Vertebrae: Sequela of T11, T12 kyphoplasty with mild and moderate respective height loss. Subacute L4 compression deformity with mild height loss and 3-4 mm retropulsion. Conus medullaris and cauda equina: Conus extends to the L1 level. Conus and cauda equina appear  normal. Disc levels: Multilevel desiccation. T12-L1: Disc bulge with superimposed left paracentral protrusion. Bilateral facet hypertrophy. Minimal to mild spinal canal narrowing. Patent neural foramen. L1-2: Disc bulge and bilateral facet hypertrophy. Mild spinal canal and right neural foraminal narrowing. Patent left neural foramen. L2-3: Minimal disc bulge and bilateral facet hypertrophy. Patent spinal canal and neural foramen. L3-4: Minimal disc bulge and bilateral facet hypertrophy. Mild spinal canal and right neural foraminal narrowing. Patent left neural foramen. L4-5: Subacute L4 compression deformity with 3-4 mm retropulsion partially effacing the ventral CSF containing spaces. Bilateral facet hypertrophy and ligamentum flavum thickening. Mild spinal canal and bilateral neural foraminal narrowing. L5-S1: No significant disc bulge. Bilateral facet hypertrophy. Patent spinal canal and neural foramen. Paraspinal and other soft tissues: Bilateral renal cysts. IMPRESSION: 1. Subacute L4 compression deformity with mild height loss and 3-4 mm retropulsion. Mild spinal canal/neural foraminal narrowing at this level. 2. Sequela of T11, T12 kyphoplasty. 3. Multilevel lumbar spondylosis as detailed above. These results will be called to the ordering clinician or representative by the Radiologist Assistant, and communication documented in the PACS or [employer]. Electronically Signed   By: Primitivo Gauze M.D.   On: 11/23/2020 16:05   Chest Portable 1 View  Result Date: 11/26/2020 CLINICAL DATA:  Oxygen desaturation. EXAM: PORTABLE CHEST 1 VIEW COMPARISON:  April 08, 2019. FINDINGS: The heart size and mediastinal contours are within normal limits. Both lungs are clear. No pneumothorax or pleural effusion is noted. The visualized skeletal structures are unremarkable. IMPRESSION: No active disease. Electronically Signed   By: Marijo Conception M.D.   On: 11/26/2020 13:18   DG C-Arm 1-60 Min  Result Date:  11/28/2020 CLINICAL DATA:  Lumbar surgery. EXAM: LUMBAR SPINE - 2-3 VIEW; DG C-ARM 1-60 MIN Radiation exposure index: 5.1 mGy. COMPARISON:  November 23, 2020. FINDINGS: Two intraoperative fluoroscopic images of the lumbar spine were obtained during L4 kyphoplasty. IMPRESSION: Fluoroscopic guidance provided during L4 kyphoplasty. Electronically Signed   By: Marijo Conception M.D.   On: 11/28/2020 16:43   DG Hip Unilat With Pelvis 2-3 Views Left  Result Date: 11/15/2020 CLINICAL DATA:  Status post fall. EXAM: DG HIP (WITH OR WITHOUT PELVIS) 2-3V LEFT COMPARISON:  None. FINDINGS: There is no evidence of an acute hip fracture or dislocation. Moderate severity degenerative changes are seen within both hips, in the form of joint space narrowing and acetabular sclerosis. Additional degenerative changes are noted throughout the visualized portion of the lumbar spine. Radiopaque surgical sutures and surgical clips are seen overlying the mid to lower right abdomen.  IMPRESSION: Moderate severity degenerative changes without evidence of an acute osseous abnormality. Electronically Signed   By: Virgina Norfolk M.D.   On: 11/15/2020 17:39       Subjective: Patient's pain is improved.  Her leg pain is resolved.  No fever, vomiting, cough.  She was agitated briefly this morning, resolved with getting up in a chair and putting home clothes on.  Discharge Exam: Vitals:   11/29/20 0738 11/29/20 1136  BP: 130/78   Pulse: 69 77  Resp: 15 15  Temp: 98.5 F (36.9 C) (!) 97.5 F (36.4 C)  SpO2: 96% 98%   Vitals:   11/28/20 2003 11/29/20 0526 11/29/20 0738 11/29/20 1136  BP: 131/79 128/81 130/78   Pulse: 68 74 69 77  Resp: 16 16 15 15   Temp: 98.1 F (36.7 C) 98.4 F (36.9 C) 98.5 F (36.9 C) (!) 97.5 F (36.4 C)  TempSrc: Oral Oral Oral Oral  SpO2: 98% 98% 96% 98%  Weight:      Height:        General: Pt is alert, awake, not in acute distress Cardiovascular: RRR, nl S1-S2, no murmurs appreciated.   No LE  edema.   Respiratory: Normal respiratory rate and rhythm.  CTAB without rales or wheezes. Abdominal: Abdomen soft and non-tender.  No distension or HSM.   Neuro/Psych: Strength symmetric in upper and lower extremities.  Judgment and insight appear normal.   The results of significant diagnostics from this hospitalization (including imaging, microbiology, ancillary and laboratory) are listed below for reference.     Microbiology: Recent Results (from the past 240 hour(s))  Resp Panel by RT-PCR (Flu A&B, Covid) Nasopharyngeal Swab     Status: None   Collection Time: 11/26/20 12:30 PM   Specimen: Nasopharyngeal Swab; Nasopharyngeal(NP) swabs in vial transport medium  Result Value Ref Range Status   SARS Coronavirus 2 by RT PCR NEGATIVE NEGATIVE Final    Comment: (NOTE) SARS-CoV-2 target nucleic acids are NOT DETECTED.  The SARS-CoV-2 RNA is generally detectable in upper respiratory specimens during the acute phase of infection. The lowest concentration of SARS-CoV-2 viral copies this assay can detect is 138 copies/mL. A negative result does not preclude SARS-Cov-2 infection and should not be used as the sole basis for treatment or other patient management decisions. A negative result may occur with  improper specimen collection/handling, submission of specimen other than nasopharyngeal swab, presence of viral mutation(s) within the areas targeted by this assay, and inadequate number of viral copies(<138 copies/mL). A negative result must be combined with clinical observations, patient history, and epidemiological information. The expected result is Negative.  Fact Sheet for Patients:  EntrepreneurPulse.com.au  Fact Sheet for Healthcare Providers:  IncredibleEmployment.be  This test is no t yet approved or cleared by the [place] FDA and  has been authorized for detection and/or diagnosis of SARS-CoV-2 by FDA under an Emergency Use  Authorization (EUA). This EUA will remain  in effect (meaning this test can be used) for the duration of the COVID-19 declaration under Section 564(b)(1) of the Act, 21 U.S.C.section 360bbb-3(b)(1), unless the authorization is terminated  or revoked sooner.       Influenza A by PCR NEGATIVE NEGATIVE Final   Influenza B by PCR NEGATIVE NEGATIVE Final    Comment: (NOTE) The Xpert Xpress SARS-CoV-2/FLU/RSV plus assay is intended as an aid in the diagnosis of influenza from Nasopharyngeal swab specimens and should not be used as a sole basis for treatment. Nasal washings and aspirates are unacceptable  for Xpert Xpress SARS-CoV-2/FLU/RSV testing.  Fact Sheet for Patients: EntrepreneurPulse.com.au  Fact Sheet for Healthcare Providers: IncredibleEmployment.be  This test is not yet approved or cleared by the [place] FDA and has been authorized for detection and/or diagnosis of SARS-CoV-2 by FDA under an Emergency Use Authorization (EUA). This EUA will remain in effect (meaning this test can be used) for the duration of the COVID-19 declaration under Section 564(b)(1) of the Act, 21 U.S.C. section 360bbb-3(b)(1), unless the authorization is terminated or revoked.  Performed at [facility], [address] [place], [place] [address]      Labs: BNP (last 3 results) No results for input(s): BNP in the last 8760 hours. Basic Metabolic Panel: Recent Labs  Lab 11/26/20 1218 11/27/20 0426  NA 139 141  K 3.3* 4.3  CL 111 109  CO2 20* 23  GLUCOSE 143* 131*  BUN 31* 28*  CREATININE 0.65 0.70  CALCIUM 8.7* 9.8   Liver Function Tests: No results for input(s): AST, ALT, ALKPHOS, BILITOT, PROT, ALBUMIN in the last 168 hours. No results for input(s): LIPASE, AMYLASE in the last 168 hours. No results for input(s): AMMONIA in the last 168 hours. CBC: Recent Labs  Lab 11/26/20 1218 11/27/20 0426  WBC 8.8 9.2  NEUTROABS 5.4  --    HGB 14.1 15.1*  HCT 41.3 43.8  MCV 92.8 92.4  PLT 215 232   Cardiac Enzymes: No results for input(s): CKTOTAL, CKMB, CKMBINDEX, TROPONINI in the last 168 hours. BNP: Invalid input(s): POCBNP CBG: Recent Labs  Lab 11/28/20 1626  GLUCAP 147*   D-Dimer No results for input(s): DDIMER in the last 72 hours. Hgb A1c No results for input(s): HGBA1C in the last 72 hours. Lipid Profile No results for input(s): CHOL, HDL, LDLCALC, TRIG, CHOLHDL, LDLDIRECT in the last 72 hours. Thyroid function studies No results for input(s): TSH, T4TOTAL, T3FREE, THYROIDAB in the last 72 hours.  Invalid input(s): FREET3 Anemia work up No results for input(s): VITAMINB12, FOLATE, FERRITIN, TIBC, IRON, RETICCTPCT in the last 72 hours. Urinalysis    Component Value Date/Time   COLORURINE YELLOW (A) 11/26/2020 1711   APPEARANCEUR HAZY (A) 11/26/2020 1711   APPEARANCEUR Clear 04/01/2014 0246   LABSPEC 1.035 (H) 11/26/2020 1711   LABSPEC 1.006 04/01/2014 0246   PHURINE 5.0 11/26/2020 1711   GLUCOSEU NEGATIVE 11/26/2020 1711   GLUCOSEU >=500 04/01/2014 0246   HGBUR NEGATIVE 11/26/2020 1711   BILIRUBINUR NEGATIVE 11/26/2020 1711   BILIRUBINUR Negative 04/01/2014 0246   KETONESUR NEGATIVE 11/26/2020 1711   PROTEINUR 30 (A) 11/26/2020 1711   NITRITE NEGATIVE 11/26/2020 1711   LEUKOCYTESUR LARGE (A) 11/26/2020 1711   LEUKOCYTESUR Negative 04/01/2014 0246   Sepsis Labs Invalid input(s): PROCALCITONIN,  WBC,  LACTICIDVEN Microbiology Recent Results (from the past 240 hour(s))  Resp Panel by RT-PCR (Flu A&B, Covid) Nasopharyngeal Swab     Status: None   Collection Time: 11/26/20 12:30 PM   Specimen: Nasopharyngeal Swab; Nasopharyngeal(NP) swabs in vial transport medium  Result Value Ref Range Status   SARS Coronavirus 2 by RT PCR NEGATIVE NEGATIVE Final    Comment: (NOTE) SARS-CoV-2 target nucleic acids are NOT DETECTED.  The SARS-CoV-2 RNA is generally detectable in upper respiratory specimens  during the acute phase of infection. The lowest concentration of SARS-CoV-2 viral copies this assay can detect is 138 copies/mL. A negative result does not preclude SARS-Cov-2 infection and should not be used as the sole basis for treatment or other patient management decisions. A negative result may  occur with  improper specimen collection/handling, submission of specimen other than nasopharyngeal swab, presence of viral mutation(s) within the areas targeted by this assay, and inadequate number of viral copies(<138 copies/mL). A negative result must be combined with clinical observations, patient history, and epidemiological information. The expected result is Negative.  Fact Sheet for Patients:  EntrepreneurPulse.com.au  Fact Sheet for Healthcare Providers:  IncredibleEmployment.be  This test is no t yet approved or cleared by the [place] FDA and  has been authorized for detection and/or diagnosis of SARS-CoV-2 by FDA under an Emergency Use Authorization (EUA). This EUA will remain  in effect (meaning this test can be used) for the duration of the COVID-19 declaration under Section 564(b)(1) of the Act, 21 U.S.C.section 360bbb-3(b)(1), unless the authorization is terminated  or revoked sooner.       Influenza A by PCR NEGATIVE NEGATIVE Final   Influenza B by PCR NEGATIVE NEGATIVE Final    Comment: (NOTE) The Xpert Xpress SARS-CoV-2/FLU/RSV plus assay is intended as an aid in the diagnosis of influenza from Nasopharyngeal swab specimens and should not be used as a sole basis for treatment. Nasal washings and aspirates are unacceptable for Xpert Xpress SARS-CoV-2/FLU/RSV testing.  Fact Sheet for Patients: EntrepreneurPulse.com.au  Fact Sheet for Healthcare Providers: IncredibleEmployment.be  This test is not yet approved or cleared by the [place] FDA and has been authorized for detection  and/or diagnosis of SARS-CoV-2 by FDA under an Emergency Use Authorization (EUA). This EUA will remain in effect (meaning this test can be used) for the duration of the COVID-19 declaration under Section 564(b)(1) of the Act, 21 U.S.C. section 360bbb-3(b)(1), unless the authorization is terminated or revoked.  Performed at [facility], [address] [place], [place] [address]      Time coordinating discharge: 25 minutes The [place] controlled substances registry was reviewed for this patient prior to filling the <5 days supply controlled substances script.      SIGNED:   Edwin Dada, MD  [facility] 11/29/2020, 1:10 PM

## 2020-11-29 NOTE — Anesthesia Postprocedure Evaluation (Signed)
Anesthesia Post Note  Patient: Rachael Humphrey  Procedure(s) Performed: Ronna Polio  USE VASCULAR BED (N/A )  Patient location during evaluation: PACU Anesthesia Type: General Level of consciousness: awake and alert Pain management: pain level controlled Vital Signs Assessment: post-procedure vital signs reviewed and stable Respiratory status: spontaneous breathing, nonlabored ventilation and respiratory function stable Cardiovascular status: blood pressure returned to baseline and stable Postop Assessment: no apparent nausea or vomiting Anesthetic complications: no   No complications documented.   Last Vitals:  Vitals:   11/29/20 0526 11/29/20 0738  BP: 128/81   Pulse: 74 69  Resp: 16 15  Temp: 36.9 C 36.9 C  SpO2: 98% 96%    Last Pain:  Vitals:   11/29/20 0738  TempSrc: Oral  PainSc:                  Tera Mater

## 2020-11-29 NOTE — Progress Notes (Signed)
PT Cancellation Note  Patient Details Name: Rachael Humphrey MRN: [id number] DOB: 1930/01/10   Cancelled Treatment:    Reason Eval/Treat Not Completed: Other (comment). PT entered room to speak to patient, per RN pt has had increased confusion this AM. Pt oriented to self and hospital when asked several times, but did display paranoia as well as irritation with PT presence. When educated on PT role and need for mobility, pt said no and told PT "If you're trying to be sneaky then you think everyone should be in on it" as well as "I don't need you I was up walking all night last night". PT to re-attempt as able, and RN in room.   Lieutenant Diego PT, DPT 9:52 AM,11/29/20

## 2020-11-29 NOTE — Evaluation (Signed)
Physical Therapy Re-Evaluation Patient Details Name: Rachael Humphrey MRN: [id number] DOB: Sep 06, 1930 Today's Date: 11/29/2020   History of Present Illness  Pt is an 87 y.o. female with medical history significant for hyperlipidemia, hypertension, hypothyroid, GERD, history of compression fracture status post kyphoplasty, presents emergency department for chief concerns back pain resulting from a fall on 11/15/20.  MD assessment includes: compression fracture of L4 lumbar vertebra, acute hypoxic respiratory failure secondary to pain medication, HTN, and ecchymosis, fading at the left gluteus down to the mid thigh. Pt s/p kyphoplasty.    Clinical Impression  Pt seen as a re-evaluation due to s/p kyphoplasty. Pt seen with RN at RN request, pt stated she was willing to get up and get dressed. Pt oriented during session with occasional irritation/agitation about situation, was requesting staff to speak to her, no whispering. She was able to follow all commands well. Confirmed PLOF, and family friend in room confident that near 24/7 assistance could be provided if needed.   Pt performed log rolling with good recall to sit EOB. Briefs and pants donned with minA and use of RW in standing. Pt ambulated ~26ft with RW and CGA/supervision. Pt able to also use standard commode with grab bar and stand at sink to brush her hair and her teeth with good standing balance. Up in recliner with RN and visitor at bedside after mobility.  Overall the patient demonstrated deficits (see "PT Problem List") that impede the patient's functional abilities, safety, and mobility and would benefit from skilled PT intervention. Recommendation is HHPT with supervision for mobility/OOB/intermittently to maximize safety, mobility, and return to PLOF.     Follow Up Recommendations Home health PT;Supervision for mobility/OOB;Supervision - Intermittent    Equipment Recommendations  None recommended by PT;Other (comment) (pt has RW and BSC)     Recommendations for Other Services       Precautions / Restrictions Precautions Precautions: Fall Restrictions Weight Bearing Restrictions: No      Mobility  Bed Mobility Overal bed mobility: Modified Independent Bed Mobility: Rolling;Sidelying to Sit Rolling: Supervision Sidelying to sit: Supervision;HOB elevated       General bed mobility comments: pt recalled log rolling technique    Transfers Overall transfer level: Needs assistance Equipment used: Rolling walker (2 wheeled) Transfers: Sit to/from Stand Sit to Stand: Min guard;Supervision         General transfer comment: pt with extra effort to stand from low surfaces (EOB, standard commode) cued for hand placement to increase safety  Ambulation/Gait   Gait Distance (Feet): 45 Feet Assistive device: Rolling walker (2 wheeled)       General Gait Details: Slow cadence with short B step length but steady without LOB  Stairs            Wheelchair Mobility    Modified Rankin (Stroke Patients Only)       Balance Overall balance assessment: Needs assistance Sitting-balance support: Feet supported Sitting balance-Leahy Scale: Normal     Standing balance support: No upper extremity supported;During functional activity Standing balance-Leahy Scale: Good Standing balance comment: pt able to stand at sink and brush hair/brush teeth modI                             Pertinent Vitals/Pain Pain Assessment: Faces Faces Pain Scale: Hurts a little bit Pain Location: Low back Pain Descriptors / Indicators: Grimacing Pain Intervention(s): Limited activity within patient's tolerance;Repositioned;Patient requesting pain meds-RN notified    Home  Living Family/patient expects to be discharged to:: Private residence Living Arrangements: Alone Available Help at Discharge: Family;Friend(s);Available 24 hours/day Type of Home: House Home Access: Stairs to enter Entrance Stairs-Rails:  Right Entrance Stairs-Number of Steps: 1 with no rails or 2 with R rail Home Layout: One level Home Equipment: Walker - 2 wheels;Walker - 4 wheels;Bedside commode Additional Comments: pt with irritation this session but able to answer all PLOf information    Prior Function Level of Independence: Independent         Comments: Ind amb without an AD, push mows and leaf blows yard, active, no other falls with current fall secondary to tripping over cords while visiting family in emergency room, Ind with ADLs, drives, runs all of her own errands     Hand Dominance        Extremity/Trunk Assessment   Upper Extremity Assessment Upper Extremity Assessment: Overall WFL for tasks assessed    Lower Extremity Assessment Lower Extremity Assessment: Generalized weakness       Communication   Communication: No difficulties  Cognition Arousal/Alertness: Awake/alert Behavior During Therapy: Agitated;WFL for tasks assessed/performed Overall Cognitive Status: Difficult to assess                                 General Comments: Pt with improved mentation upon eval, though still more agitated than baseline per family friend in the room. able to answer all orientation questions, recall surgery      General Comments      Exercises Other Exercises Other Exercises: use of standard commode with grab bar and RW   Assessment/Plan    PT Assessment Patient needs continued PT services  PT Problem List Decreased strength;Decreased activity tolerance;Decreased balance;Decreased mobility;Decreased knowledge of use of DME;Pain       PT Treatment Interventions Gait training;Stair training;Functional mobility training;Therapeutic activities;DME instruction;Therapeutic exercise;Balance training;Patient/family education;Neuromuscular re-education    PT Goals (Current goals can be found in the Care Plan section)  Acute Rehab PT Goals Patient Stated Goal: to go home PT Goal  Formulation: With patient Time For Goal Achievement: 12/13/20 Potential to Achieve Goals: Good    Frequency 7X/week   Barriers to discharge        Co-evaluation               AM-PAC PT "6 Clicks" Mobility  Outcome Measure Help needed turning from your back to your side while in a flat bed without using bedrails?: None Help needed moving from lying on your back to sitting on the side of a flat bed without using bedrails?: None Help needed moving to and from a bed to a chair (including a wheelchair)?: None Help needed standing up from a chair using your arms (e.g., wheelchair or bedside chair)?: None Help needed to walk in hospital room?: None Help needed climbing 3-5 steps with a railing? : A Little 6 Click Score: 23    End of Session   Activity Tolerance: Patient tolerated treatment well Patient left: in chair;with chair alarm set;with nursing/sitter in room;with family/visitor present Nurse Communication: Mobility status PT Visit Diagnosis: Difficulty in walking, not elsewhere classified (R26.2);Muscle weakness (generalized) (M62.81);Pain Pain - Right/Left:  (midline) Pain - part of body:  (back pain)    Time: 2423-5361 PT Time Calculation (min) (ACUTE ONLY): 28 min   Charges:   PT Evaluation $PT Re-evaluation: 1 Re-eval PT Treatments $Therapeutic Activity: 23-37 mins       Beverlee Nims  Megan Salon PT, DPT 11:16 AM,11/29/20

## 2020-11-29 NOTE — Progress Notes (Signed)
   Subjective: 1 Day Post-Op Procedure(s) (LRB): KYPHOPLASTY-L4  USE VASCULAR BED (N/A) Patient reports pain as mild, much improved. Pain controlled with Tylenol Patient is well, and has had no acute complaints or problems Denies any CP, SOB, ABD pain. Good progress with PT tody Plan is to go Home after hospital stay.  Objective: Vital signs in last 24 hours: Temp:  [97.4 F (36.3 C)-98.5 F (36.9 C)] 98.5 F (36.9 C) (03/17 0738) Pulse Rate:  [66-88] 69 (03/17 0738) Resp:  [10-16] 15 (03/17 0738) BP: (113-143)/(68-90) 128/81 (03/17 0526) SpO2:  [92 %-98 %] 96 % (03/17 0738) Weight:  [54.4 kg] 54.4 kg (03/16 1441)  Intake/Output from previous day: 03/16 0701 - 03/17 0700 In: 240 [P.O.:240] Out: -  Intake/Output this shift: Total I/O In: 60 [P.O.:60] Out: -   Recent Labs    11/26/20 1218 11/27/20 0426  HGB 14.1 15.1*   Recent Labs    11/26/20 1218 11/27/20 0426  WBC 8.8 9.2  RBC 4.45 4.74  HCT 41.3 43.8  PLT 215 232   Recent Labs    11/26/20 1218 11/27/20 0426  NA 139 141  K 3.3* 4.3  CL 111 109  CO2 20* 23  BUN 31* 28*  CREATININE 0.65 0.70  GLUCOSE 143* 131*  CALCIUM 8.7* 9.8   No results for input(s): LABPT, INR in the last 72 hours.  EXAM General - Patient is Alert, Appropriate and Oriented Lumbar spine - band-aid intact, non tender to palpation. No swelling, warmth, drainage. Motor Function - intact, moving foot and toes well on exam.   Past Medical History:  Diagnosis Date  . Cancer [facility]) 2005   colon cancer  . Colon cancer ([place])   . Complication of anesthesia   . Coronary artery disease   . Diabetes mellitus without complication ([place])   . GERD (gastroesophageal reflux disease)   . H/O hiatal hernia   . Hemorrhoids    internal-not a problem now.  . History of kidney stones    multiple  . History of shingles    04-18-14 3 weeks now -pain only left posterior back , no outbreaks  . Hypertension   . Hypothyroidism   . Myocardial  infarction ([place])    2 months ago, stent placed [facility]  . PONV (postoperative nausea and vomiting)    no recent problems  . Stroke ([place])    TIA    Assessment/Plan:   1 Day Post-Op Procedure(s) (LRB): KYPHOPLASTY-L4  USE VASCULAR BED (N/A) Principal Problem:   Closed compression fracture of L4 lumbar vertebra, initial encounter (HCC) Active Problems:   Acquired hypothyroidism   Coronary artery disease   Benign essential hypertension   GERD (gastroesophageal reflux disease)   History of TIA (transient ischemic attack)   Hyperlipidemia, unspecified   Type 2 diabetes mellitus without complication ([place])   Fall   Back pain  Estimated body mass index is 22.67 kg/m as calculated from the following:   Height as of this encounter: 5\' 1"  (1.549 m).   Weight as of this encounter: 54.4 kg. Advance diet Up with therapy Pain well controlled. Doing much better post operatively with ambulation Follow up with [facility] orthopedics in 2 weeks Ok to remove band-aid and allow patient to shower Tylenol PRN pain   T. Rachelle Hora, PA-C [place] 11/29/2020, 10:50 AM

## 2020-11-29 NOTE — TOC Transition Note (Signed)
Transition of Care [facility]) - CM/SW Discharge Note   Patient Details  Name: Rachael Humphrey MRN: [id number] Date of Birth: 1929/09/22  Transition of Care [facility]) CM/SW Contact:  Shelbie Hutching, RN Phone Number: 11/29/2020, 2:41 PM   Clinical Narrative:    Patient is medically cleared for discharge home with home health services.  [facility] for [facility] PT and OT.  Corene Cornea with [facility] is aware of discharge for today.     Final next level of care: [place] Barriers to Discharge: Barriers Resolved   Patient Goals and CMS Choice Patient states their goals for this hospitalization and ongoing recovery are:: agrees to home health services CMS Medicare.gov Compare Post Acute Care list provided to:: Patient Choice offered to / list presented to : Patient  Discharge Placement                       Discharge Plan and Services   Discharge Planning Services: CM Consult Post Acute Care Choice: Home Health          DME Arranged: N/A DME Agency: NA       HH Arranged: PT,OT [place] Agency: [place] ([facility]) Date [place]: 11/29/20 Time [place]: [id number] Representative spoke with at [place]: [place] ([place]) Interventions     Readmission Risk Interventions Readmission Risk Prevention Plan 04/09/2019  Post Dischage Appt Complete  Medication Screening Complete  Transportation Screening Complete  Some recent data might be hidden

## 2020-12-10 ENCOUNTER — Other Ambulatory Visit: Payer: Self-pay | Admitting: Orthopedic Surgery

## 2020-12-10 ENCOUNTER — Other Ambulatory Visit (HOSPITAL_COMMUNITY): Payer: Self-pay | Admitting: Orthopedic Surgery

## 2020-12-10 DIAGNOSIS — M25551 Pain in right hip: Secondary | ICD-10-CM

## 2020-12-12 ENCOUNTER — Ambulatory Visit
Admission: RE | Admit: 2020-12-12 | Discharge: 2020-12-12 | Disposition: A | Discharge status: Home / Self Care | Payer: Medicare Other | Source: Ambulatory Visit | Attending: Orthopedic Surgery | Admitting: Orthopedic Surgery

## 2020-12-12 ENCOUNTER — Other Ambulatory Visit: Payer: Self-pay

## 2020-12-12 DIAGNOSIS — M25551 Pain in right hip: Secondary | ICD-10-CM | POA: Diagnosis not present

## 2020-12-27 ENCOUNTER — Encounter: Payer: Self-pay | Admitting: Orthopedic Surgery

## 2021-04-12 ENCOUNTER — Other Ambulatory Visit: Payer: Self-pay | Admitting: Orthopedic Surgery

## 2021-04-12 DIAGNOSIS — S32030A Wedge compression fracture of third lumbar vertebra, initial encounter for closed fracture: Secondary | ICD-10-CM

## 2021-04-16 ENCOUNTER — Ambulatory Visit
Admission: RE | Admit: 2021-04-16 | Discharge: 2021-04-16 | Disposition: A | Discharge status: Home / Self Care | Payer: Medicare Other | Source: Ambulatory Visit | Attending: Orthopedic Surgery | Admitting: Orthopedic Surgery

## 2021-04-16 ENCOUNTER — Other Ambulatory Visit: Payer: Self-pay

## 2021-04-16 DIAGNOSIS — S32030A Wedge compression fracture of third lumbar vertebra, initial encounter for closed fracture: Secondary | ICD-10-CM | POA: Insufficient documentation

## 2021-04-19 ENCOUNTER — Other Ambulatory Visit: Payer: Self-pay | Admitting: Orthopedic Surgery

## 2021-04-22 ENCOUNTER — Encounter
Admission: RE | Admit: 2021-04-22 | Discharge: 2021-04-22 | Disposition: A | Discharge status: Home / Self Care | Payer: Medicare Other | Source: Ambulatory Visit | Attending: Orthopedic Surgery | Admitting: Orthopedic Surgery

## 2021-04-22 ENCOUNTER — Other Ambulatory Visit: Payer: Self-pay

## 2021-04-22 NOTE — Patient Instructions (Addendum)
Your procedure is scheduled on:04/25/2021 Report to the Registration Desk on the 1st floor of the [place]. To find out your arrival time, please call [phone] between 1PM - 3PM on: 04/24/2021   REMEMBER: Instructions that are not followed completely may result in serious medical risk, up to and including death; or upon the discretion of your surgeon and anesthesiologist your surgery may need to be rescheduled.  Do not eat food after midnight the night before surgery.  No gum chewing, lozengers or hard candies.  You may however, drink CLEAR liquids up to 2 hours before you are scheduled to arrive for your surgery. Do not drink anything within 2 hours of your scheduled arrival time.  Clear liquids include: - water  - apple juice without pulp - gatorade (not RED, PURPLE, OR BLUE) - black coffee or tea (Do NOT add milk or creamers to the coffee or tea) Do NOT drink anything that is not on this list.    In addition, your doctor has ordered for you to drink the provided  Ensure Pre-Surgery Clear Carbohydrate Drink   Drinking this carbohydrate drink up to two hours before surgery helps to reduce insulin resistance and improve patient outcomes. Please complete drinking 2 hours prior to scheduled arrival time.  TAKE THESE MEDICATIONS THE MORNING OF SURGERY WITH A SIP OF WATER:  Lipitor Zyrtec  levothyroxine  metoprolol succinate  pantoprazole - (take one the night before and one on the morning of surgery - helps to prevent nausea after surgery.) NIFEdipine          Do not take medication not on the list above on morning of surgery.     Follow recommendations from Cardiologist, or PCP or surgeon  regarding stopping Plavix. Last dose was April 19, 2021   One week prior to surgery: Stop Anti-inflammatories (NSAIDS) such as Advil, Aleve, Ibuprofen, Motrin, Naproxen, Naprosyn and Aspirin based products such as Excedrin, Goodys Powder, BC Powder. Stop ANY OVER THE COUNTER  supplements until after surgery. Multiple Vitamins-Minerals , Calcium Carb-Cholecalciferol , Multiple Vitamins-Minerals (PRESERVISION AREDS)  You may however, continue to take Tylenol if needed for pain up until the day of surgery.    No Alcohol for 24 hours before or after surgery.  No Smoking including e-cigarettes for 24 hours prior to surgery.  No chewable tobacco products for at least 6 hours prior to surgery.  No nicotine patches on the day of surgery.  Do not use any "recreational" drugs for at least a week prior to your surgery.  Please be advised that the combination of cocaine and anesthesia may have negative outcomes, up to and including death. If you test positive for cocaine, your surgery will be cancelled.  On the morning of surgery brush your teeth with toothpaste and water, you may rinse your mouth with mouthwash if you wish. Do not swallow any toothpaste or mouthwash.  Do not wear jewelry, make-up, hairpins, clips or nail polish.  Do not wear lotions, powders, or perfumes.   Do not shave body from the neck down 48 hours prior to surgery just in case you cut yourself which could leave a site for infection.  Also, freshly shaved skin may become irritated if using the CHG soap.  Contact lenses, hearing aids and dentures may not be worn into surgery.  Do not bring valuables to the hospital. [facility] is not responsible for any missing/lost belongings or valuables.   Use CHG Soap or wipes as directed on instruction sheet. If  you experience break out or rashes on first use, Don't use again!  Notify your doctor if there is any change in your medical condition (cold, fever, infection).  Wear comfortable clothing (specific to your surgery type) to the hospital.  After surgery, you can help prevent lung complications by doing breathing exercises.  Take deep breaths and cough every 1-2 hours. Your doctor may order a device called an Incentive Spirometer to help you take  deep breaths.  If you are being admitted to the hospital overnight, leave your suitcase in the car. After surgery it may be brought to your room.  If you are being discharged the day of surgery, you will not be allowed to drive home. You will need a responsible adult (18 years or older) to drive you home and stay with you that night.   If you are taking public transportation, you will need to have a responsible adult (18 years or older) with you. Please confirm with your physician that it is acceptable to use public transportation.   Please call the [place] Dept. at [phone] if you have any questions about these instructions.  Surgery Visitation Policy:  Patients undergoing a surgery or procedure may have one family member or support person with them as long as that person is not COVID-19 positive or experiencing its symptoms.  That person may remain in the waiting area during the procedure.  Inpatient Visitation:    Visiting hours are 7 a.m. to 8 p.m. Inpatients will be allowed two visitors daily. The visitors may change each day during the patient's stay. No visitors under the age of 74. Any visitor under the age of 75 must be accompanied by an adult. The visitor must pass COVID-19 screenings, use hand sanitizer when entering and exiting the patient's room and wear a mask at all times, including in the patient's room. Patients must also wear a mask when staff or their visitor are in the room. Masking is required regardless of vaccination status.

## 2021-04-23 ENCOUNTER — Encounter: Payer: Self-pay | Admitting: Orthopedic Surgery

## 2021-04-23 ENCOUNTER — Encounter
Admission: RE | Admit: 2021-04-23 | Discharge: 2021-04-23 | Disposition: A | Discharge status: Home / Self Care | Payer: Medicare Other | Source: Ambulatory Visit | Attending: Orthopedic Surgery | Admitting: Orthopedic Surgery

## 2021-04-23 DIAGNOSIS — Z01818 Encounter for other preprocedural examination: Secondary | ICD-10-CM | POA: Insufficient documentation

## 2021-04-23 DIAGNOSIS — Z01810 Encounter for preprocedural cardiovascular examination: Secondary | ICD-10-CM

## 2021-04-23 LAB — BASIC METABOLIC PANEL
Anion gap: 8 (ref 5–15)
BUN: 16 mg/dL (ref 8–23)
CO2: 26 mmol/L (ref 22–32)
Calcium: 9.6 mg/dL (ref 8.9–10.3)
Chloride: 106 mmol/L (ref 98–111)
Creatinine, Ser: 0.67 mg/dL (ref 0.44–1.00)
GFR, Estimated: >60 mL/min (ref >60)
Glucose, Bld: 213 mg/dL — ABNORMAL HIGH (ref 70–99)
Potassium: 4.4 mmol/L (ref 3.5–5.1)
Sodium: 140 mmol/L (ref 135–145)

## 2021-04-23 LAB — CBC
HCT: 40.8 % (ref 36.0–46.0)
Hemoglobin: 13.8 g/dL (ref 12.0–15.0)
MCH: 32.4 pg (ref 26.0–34.0)
MCHC: 33.8 g/dL (ref 30.0–36.0)
MCV: 95.8 fL (ref 80.0–100.0)
Platelets: 182 10*3/uL (ref 150–400)
RBC: 4.26 MIL/uL (ref 3.87–5.11)
RDW: 12.5 % (ref 11.5–15.5)
WBC: 5.9 10*3/uL (ref 4.0–10.5)
nRBC: 0 % (ref 0.0–0.2)

## 2021-04-23 NOTE — Progress Notes (Signed)
Perioperative Services  Pre-Admission/Anesthesia Testing Clinical Review  Date: 04/24/21  Patient Demographics:  Name: Rachael Humphrey DOB:   01/08/1930 MRN:   [id number]  Planned Surgical Procedure(s):   Case: [id number] Date/Time: 04/25/21 1526  Procedure: L 3 KYPHOPLASTY  Anesthesia type: Choice  Pre-op diagnosis: Closed wedge compression fracture of L3 vertebra, initial encounter S32.030A  Location: [place] [facility]  Surgeons: Hessie Knows, MD   NOTE: Available PAT nursing documentation and vital signs have been reviewed. Clinical nursing staff has updated patient's PMH/PSHx, current medication list, and drug allergies/intolerances to ensure comprehensive history available to assist in medical decision making as it pertains to the aforementioned surgical procedure and anticipated anesthetic course. Extensive review of available clinical information performed. [place] PMH and PSHx updated with any diagnoses/procedures that  may have been inadvertently omitted during her intake with the pre-admission testing department's nursing staff.  Clinical Discussion:  Rachael Humphrey is a 87 y.o. female who is submitted for pre-surgical anesthesia review and clearance prior to her undergoing the above procedure. Patient has never been a smoker. Pertinent PMH includes: CAD, MI, G2DD, biatrial enlargement, valvular regurgitation, HTN, HLD, T2DM, TIA, hypothyroidism, GERD (on daily PPI), colon cancer.  Patient is followed by cardiology Rachael Glassing, MD). She was last seen in the cardiology clinic on 04/16/2021; notes reviewed.  At the time of her clinic visit, patient doing well overall from a cardiac perspective.  She complained of mild weakness and fatigue.  Patient denied chest pain, shortness breath, PND, orthopnea, palpitations, significant peripheral edema, vertiginous symptoms, or presyncope/syncope.  PMH significant for cardiovascular diagnoses.  Patient has suffered a  MI in the past per her report; date unknown.  Patient found to have an abnormal EKG and myocardial perfusion imaging study in late May 2015.  Stress test showed anteroapical ischemia with a mildly reduced LVEF of 49%.  She underwent diagnostic left heart catheterization on 02/23/2014 revealing a 99% stenosis of the proximal LAD.  2.75 x 28 mm Vision BMS x 1 placed to the proximal LAD.  Follow-up myocardial perfusion imaging study on 05/09/2014 was normal revealing no evidence of stress-induced myocardial ischemia or arrhythmia; LVEF 48%.  Most recent noninvasive studies include a TTE that was performed on 11/14/2020 that revealed globally normal systolic function with mild pan valvular regurgitation.  Doppler parameters consistent with G2DD.  Patient on daily antiplatelet therapy using standard dose of clopidogrel; compliant with therapy with no evidence of GI bleeding.  Blood pressure reasonably controlled at 140/80 on currently prescribed ARB beta-blocker, and CCB therapies.  She is on a statin for her HLD.  T2DM well controlled on currently prescribed regimen; last Hgb A1c 6.5% when checked on 01/23/2021.  Functional capacity limited by orthopedic pain/problems, however patient continues to be able to achieve at least 4 METS of activity.  No changes were made to her medication regimen.  Patient to follow-up with outpatient cardiology in 6 months or sooner if needed.    Patient is scheduled for a kyphoplasty on 04/25/2021 with Dr. Hessie Knows, MD. Given patient's past medical history significant for cardiovascular diagnoses, presurgical cardiac clearance was sought by the PAT team. Per cardiology, "this patient is optimized for surgery and may proceed with the planned procedural course with an LOW risk of significant perioperative cardiovascular complications". Again, this patient is on daily antiplatelet therapy. She has been instructed on recommendations for holding her clopidogrel for 5 days prior to  her procedure with plans to restart  as soon as postoperative bleeding risk felt to be minimized by her attending surgeon. The patient has been instructed that her last dose of her anticoagulant will be on 04/19/2021.  Patient reports previous perioperative complications with anesthesia in the past. Patient has a PMH (+) for PONV.  Order placed by PAT for patient to receive a prophylactic preoperative dose of aprepitant. In review of the available records, it is noted that patient underwent a general anesthetic course here (ASA III) in 11/2020 without documented complications.   Vitals with BMI 04/22/2021 11/29/2020 11/29/2020  Height '5\' 1"'$  - -  Weight 117 lbs - -  BMI 0000000 - -  Systolic - - [id number]  Diastolic - - 78  Pulse - 77 69    Providers/Specialists:   NOTE: Primary physician provider listed below. Patient may have been seen by APP or partner within same practice.   PROVIDER ROLE / SPECIALTY LAST Fabio Bering, MD Orthopedics (Surgeon) 04/12/2021  Idelle Crouch, MD Primary Care Provider 01/23/2021  Bartholome Bill, MD Cardiology 04/16/2021   Allergies:  Codeine, Darvon [propoxyphene], Demerol [meperidine], Gabapentin, Sulfa antibiotics, Tramadol, Voltaren [diclofenac sodium], and Zinc  Current Home Medications:   No current facility-administered medications for this encounter.    atorvastatin (LIPITOR) 20 MG tablet   baclofen (LIORESAL) 10 MG tablet   Calcium Carb-Cholecalciferol (CALCIUM 600 + D PO)   cetirizine (ZYRTEC) 10 MG tablet   clopidogrel (PLAVIX) 75 MG tablet   fluticasone (FLONASE) 50 MCG/ACT nasal spray   levothyroxine (SYNTHROID, LEVOTHROID) 25 MCG tablet   lidocaine (LIDODERM) 5 %   losartan (COZAAR) 25 MG tablet   metoprolol succinate (TOPROL-XL) 50 MG 24 hr tablet   montelukast (SINGULAIR) 10 MG tablet   Multiple Vitamin (MULTIVITAMIN WITH MINERALS) TABS tablet   Multiple Vitamins-Minerals (PRESERVISION AREDS 2 PO)   mupirocin ointment (BACTROBAN) 2 %    NIFEdipine (PROCARDIA-XL/NIFEDICAL-XL) 30 MG 24 hr tablet   pantoprazole (PROTONIX) 40 MG tablet   History:   Past Medical History:  Diagnosis Date   Adenocarcinoma, colon ([place]) 2005   Biatrial enlargement    Chronic anticoagulation    Clopidogrel   Complication of anesthesia    Coronary artery disease    a.) 2.75 x 28 mm Vision BMS to pLAD on 02/23/2014   GERD (gastroesophageal reflux disease)    Grade II diastolic dysfunction    H/O hiatal hernia    Hemorrhoids    internal-not a problem now.   History of kidney stones    multiple   History of methicillin resistant staphylococcus aureus (MRSA) 2020   History of shingles 04/18/2014   HLD (hyperlipidemia)    Hypertension    Hypothyroidism    Myocardial infarction (HCC)    PONV (postoperative nausea and vomiting)    Senile osteoporosis    T2DM (type 2 diabetes mellitus) (HCC)    TIA (transient ischemic attack)    Valvular regurgitation    panvalvular   Past Surgical History:  Procedure Laterality Date   APPENDECTOMY     AUGMENTATION MAMMAPLASTY Bilateral 1978   BREAST BIOPSY Right    over 30 years ago negative   BREAST BIOPSY Left    negative over 30 years ago   CATARACT EXTRACTION, BILATERAL     COLECTOMY     partial small and large instestine removed   CORONARY ANGIOPLASTY WITH STENT PLACEMENT N/A 02/23/2014   2.75 x 28 mm Vision BMS x 1 to pLAD   CYSTOSCOPY WITH RETROGRADE PYELOGRAM, URETEROSCOPY AND STENT  PLACEMENT Bilateral 05/17/2014   Procedure: CYSTOSCOPY WITH RETROGRADE PYELOGRAM, URETEROSCOPY AND STENT PLACEMENT, BASKET STONE REMOVAL;  Surgeon: Alexis Frock, MD;  Location: [facility];  Service: Urology;  Laterality: Bilateral;   KYPHOPLASTY N/A 11/25/2018   Procedure: KYPHOPLASTY T11, DIABETIC;  Surgeon: Hessie Knows, MD;  Location: [facility] OR[facility];  Service: Orthopedics;  Laterality: N/A;   KYPHOPLASTY N/A 11/28/2020   Procedure: [id number]  USE VASCULAR BED;  Surgeon: Hessie Knows, MD;  Location: [facility]  ORS;  Service: Orthopedics;  Laterality: N/A;   Family History  Problem Relation Age of Onset   Stroke Mother    Breast cancer Sister 90   Social History   Tobacco Use   Smoking status: Never   Smokeless tobacco: Never  Vaping Use   Vaping Use: Never used  Substance Use Topics   Alcohol use: No   Drug use: No    Pertinent Clinical Results:  LABS: Labs reviewed: Acceptable for surgery.  Hospital Outpatient Visit on 04/23/2021  Component Date Value Ref Range Status   WBC 04/23/2021 5.9  4.0 - 10.5 K/uL Final   RBC 04/23/2021 4.26  3.87 - 5.11 MIL/uL Final   Hemoglobin 04/23/2021 13.8  12.0 - 15.0 g/dL Final   HCT 04/23/2021 40.8  36.0 - 46.0 % Final   MCV 04/23/2021 95.8  80.0 - 100.0 fL Final   MCH 04/23/2021 32.4  26.0 - 34.0 pg Final   MCHC 04/23/2021 33.8  30.0 - 36.0 g/dL Final   RDW 04/23/2021 12.5  11.5 - 15.5 % Final   Platelets 04/23/2021 182  150 - 400 K/uL Final   nRBC 04/23/2021 0.0  0.0 - 0.2 % Final   Performed at [facility], [place], [place] [address]   Sodium 04/23/2021 140  135 - 145 mmol/L Final   Potassium 04/23/2021 4.4  3.5 - 5.1 mmol/L Final   Chloride 04/23/2021 106  98 - 111 mmol/L Final   CO2 04/23/2021 26  22 - 32 mmol/L Final   Glucose, Bld 04/23/2021 213 (A) 70 - 99 mg/dL Final   Glucose reference range applies only to samples taken after fasting for at least 8 hours.   BUN 04/23/2021 16  8 - 23 mg/dL Final   Creatinine, Ser 04/23/2021 0.67  0.44 - 1.00 mg/dL Final   Calcium 04/23/2021 9.6  8.9 - 10.3 mg/dL Final   GFR, Estimated 04/23/2021 >60  >60 mL/min Final   Comment: (NOTE) Calculated using the CKD-EPI Creatinine Equation (2021)    Anion gap 04/23/2021 8  5 - 15 Final   Performed at [facility], [address] [place], [place] [address]    ECG: Date: 04/24/2021 Time ECG obtained: 1133 AM Rate: 69 bpm Rhythm: Sinus rhythm with first-degree AV block with occasional PVCs Axis (leads I and  aVF): Left axis deviation Intervals: PR 224 ms. QRS 84 ms. QTc 420 ms. ST segment and T wave changes: No evidence of acute ST segment elevation or depression Comparison: Similar to previous tracing obtained on 04/08/2019   IMAGING / PROCEDURES: DIAGNOSTIC RADIOGRAPHS THORACOLUMBAR SPINE performed on 04/12/2021 Multiple prior kyphoplasties.  L3 has superior endplate deformity, age indeterminate, as well as L2.  Comparing to 01/21/2021 x-ray, the L3 superior endplate looks like it could be a new fracture with some slight superior depression.   TRANSTHORACIC ECHOCARDIOGRAM performed on 11/14/2020 Normal left ventricular systolic function Normal right ventricular systolic function Mild AR, MR, TR Trivial PR Mild biatrial enlargement Mild right ventricular enlargement Doppler parameters  consistent with grade 2 relaxation abnormality Mild LVH  LEXISCAN performed on 10/09/2017 LVEF 55% Regional wall motion reveals normal myocardial thickening and wall motion No artifacts noted Left ventricular cavity size normal No evidence of stress-induced myocardial ischemia or arrhythmia The overall quality of the study is good  LEFT HEART CATHETERIZATION AND CORONARY ANGIOGRAPHY performed on 02/23/2014 Single-vessel CAD 99% stenosis of the proximal LAD Successful PCI  2.75 x 28 mm Vision BMS x 1 to proximal LAD  Impression and Plan:  DANEA HENARD has been referred for pre-anesthesia review and clearance prior to her undergoing the planned anesthetic and procedural courses. Available labs, pertinent testing, and imaging results were personally reviewed by me. This patient has been appropriately cleared by cardiology with an overall LOW risk of significant perioperative cardiovascular complications.   Based on clinical review performed today (04/24/21), barring any significant acute changes in the patient's overall condition, it is anticipated that she will be able to proceed with the planned  surgical intervention. Any acute changes in clinical condition may necessitate her procedure being postponed and/or cancelled. Patient will meet with anesthesia team (MD and/or CRNA) on this day of her procedure for preoperative evaluation/assessment.   Pre-surgical instructions were reviewed with the patient during her PAT appointment and questions were fielded by PAT clinical staff. Patient was advised that if any questions or concerns arise prior to her procedure then she should return a call to PAT and/or her surgeon's office to discuss.  Honor Loh, MSN, APRN, FNP-C, CEN [facility]  Peri-operative Services Nurse Practitioner Phone: [phone] 04/24/21 2:30 PM  NOTE: This note has been prepared using Dragon dictation software. Despite my best ability to proofread, there is always the potential that unintentional transcriptional errors may still occur from this process.

## 2021-04-25 ENCOUNTER — Encounter
Admission: RE | Disposition: A | Discharge status: Home / Self Care | Payer: Self-pay | Source: Home / Self Care | Attending: Orthopedic Surgery

## 2021-04-25 ENCOUNTER — Ambulatory Visit: Payer: Medicare Other | Admitting: Urgent Care

## 2021-04-25 ENCOUNTER — Ambulatory Visit: Payer: Medicare Other

## 2021-04-25 ENCOUNTER — Ambulatory Visit
Admission: RE | Admit: 2021-04-25 | Discharge: 2021-04-25 | Disposition: A | Discharge status: Home / Self Care | Payer: Medicare Other | Attending: Orthopedic Surgery | Admitting: Orthopedic Surgery

## 2021-04-25 ENCOUNTER — Encounter: Payer: Self-pay | Admitting: Orthopedic Surgery

## 2021-04-25 DIAGNOSIS — Z79890 Hormone replacement therapy (postmenopausal): Secondary | ICD-10-CM | POA: Insufficient documentation

## 2021-04-25 DIAGNOSIS — M47898 Other spondylosis, sacral and sacrococcygeal region: Secondary | ICD-10-CM | POA: Insufficient documentation

## 2021-04-25 DIAGNOSIS — Z888 Allergy status to other drugs, medicaments and biological substances status: Secondary | ICD-10-CM | POA: Diagnosis not present

## 2021-04-25 DIAGNOSIS — Z7902 Long term (current) use of antithrombotics/antiplatelets: Secondary | ICD-10-CM | POA: Diagnosis not present

## 2021-04-25 DIAGNOSIS — Z881 Allergy status to other antibiotic agents status: Secondary | ICD-10-CM | POA: Insufficient documentation

## 2021-04-25 DIAGNOSIS — M4856XA Collapsed vertebra, not elsewhere classified, lumbar region, initial encounter for fracture: Secondary | ICD-10-CM | POA: Insufficient documentation

## 2021-04-25 DIAGNOSIS — Z792 Long term (current) use of antibiotics: Secondary | ICD-10-CM | POA: Insufficient documentation

## 2021-04-25 DIAGNOSIS — Z419 Encounter for procedure for purposes other than remedying health state, unspecified: Secondary | ICD-10-CM

## 2021-04-25 DIAGNOSIS — Z79899 Other long term (current) drug therapy: Secondary | ICD-10-CM | POA: Diagnosis not present

## 2021-04-25 DIAGNOSIS — Z885 Allergy status to narcotic agent status: Secondary | ICD-10-CM | POA: Diagnosis not present

## 2021-04-25 DIAGNOSIS — Z882 Allergy status to sulfonamides status: Secondary | ICD-10-CM | POA: Diagnosis not present

## 2021-04-25 HISTORY — DX: Cardiomegaly: I51.7

## 2021-04-25 HISTORY — DX: Endocarditis, valve unspecified: I38

## 2021-04-25 HISTORY — DX: Age-related osteoporosis without current pathological fracture: M81.0

## 2021-04-25 HISTORY — DX: Transient cerebral ischemic attack, unspecified: G45.9

## 2021-04-25 HISTORY — DX: Other ill-defined heart diseases: I51.89

## 2021-04-25 HISTORY — DX: Type 2 diabetes mellitus without complications: E11.9

## 2021-04-25 HISTORY — DX: Long term (current) use of anticoagulants: Z79.01

## 2021-04-25 HISTORY — PX: KYPHOPLASTY: SHX5884

## 2021-04-25 HISTORY — DX: Hyperlipidemia, unspecified: E78.5

## 2021-04-25 SURGERY — KYPHOPLASTY
Anesthesia: General

## 2021-04-25 MED ORDER — LACTATED RINGERS IV SOLN: INTRAVENOUS | Status: DC

## 2021-04-25 MED ORDER — ONDANSETRON HCL 4 MG PO TABS: 4.0000 mg | ORAL_TABLET | Freq: Four times a day (QID) | ORAL | Status: DC | PRN

## 2021-04-25 MED ORDER — CEFAZOLIN SODIUM-DEXTROSE 1-4 GM/50ML-% IV SOLN
INTRAVENOUS | Status: AC
  Filled 2021-04-25: qty 50

## 2021-04-25 MED ORDER — APREPITANT 40 MG PO CAPS: 40.0000 mg | ORAL_CAPSULE | Freq: Once | ORAL | Status: AC

## 2021-04-25 MED ORDER — FENTANYL CITRATE (PF) 100 MCG/2ML IJ SOLN
INTRAMUSCULAR | Status: AC
  Filled 2021-04-25: qty 2

## 2021-04-25 MED ORDER — LIDOCAINE HCL (PF) 1 % IJ SOLN
INTRAMUSCULAR | Status: AC
  Filled 2021-04-25: qty 60

## 2021-04-25 MED ORDER — LIDOCAINE HCL 1 % IJ SOLN
INTRAMUSCULAR | Status: DC | PRN
  Administered 2021-04-25: 10 mL

## 2021-04-25 MED ORDER — PROPOFOL 500 MG/50ML IV EMUL
INTRAVENOUS | Status: DC | PRN
  Administered 2021-04-25: 50 ug/kg/min via INTRAVENOUS

## 2021-04-25 MED ORDER — DEXMEDETOMIDINE (PRECEDEX) IN NS 20 MCG/5ML (4 MCG/ML) IV SYRINGE
PREFILLED_SYRINGE | INTRAVENOUS | Status: DC | PRN
  Administered 2021-04-25: 4 ug via INTRAVENOUS
  Administered 2021-04-25: 8 ug via INTRAVENOUS

## 2021-04-25 MED ORDER — ORAL CARE MOUTH RINSE: 15.0000 mL | Freq: Once | OROMUCOSAL | Status: DC

## 2021-04-25 MED ORDER — METHOCARBAMOL 1000 MG/10ML IJ SOLN
500.0000 mg | Freq: Once | INTRAVENOUS | Status: AC
  Administered 2021-04-25: 500 mg via INTRAVENOUS
  Filled 2021-04-25: qty 5

## 2021-04-25 MED ORDER — FENTANYL CITRATE (PF) 100 MCG/2ML IJ SOLN
INTRAMUSCULAR | Status: DC | PRN
  Administered 2021-04-25: 12.5 ug via INTRAVENOUS
  Administered 2021-04-25: 25 ug via INTRAVENOUS
  Administered 2021-04-25: 12.5 ug via INTRAVENOUS

## 2021-04-25 MED ORDER — BUPIVACAINE-EPINEPHRINE (PF) 0.5% -1:200000 IJ SOLN
INTRAMUSCULAR | Status: AC
  Filled 2021-04-25: qty 30

## 2021-04-25 MED ORDER — LIDOCAINE HCL 1 % IJ SOLN
INTRAMUSCULAR | Status: DC | PRN
  Administered 2021-04-25: 40 mL

## 2021-04-25 MED ORDER — ACETAMINOPHEN 10 MG/ML IV SOLN
INTRAVENOUS | Status: AC
  Administered 2021-04-25: 1000 mg via INTRAVENOUS
  Filled 2021-04-25: qty 100

## 2021-04-25 MED ORDER — FENTANYL CITRATE (PF) 100 MCG/2ML IJ SOLN
100.0000 ug | Freq: Once | INTRAMUSCULAR | Status: AC
Start: 2021-04-25 — End: 2021-04-25

## 2021-04-25 MED ORDER — IOHEXOL 180 MG/ML  SOLN
INTRAMUSCULAR | Status: DC | PRN
  Administered 2021-04-25: 20 mL

## 2021-04-25 MED ORDER — ACETAMINOPHEN 10 MG/ML IV SOLN: 1000.0000 mg | Freq: Once | INTRAVENOUS | Status: DC | PRN

## 2021-04-25 MED ORDER — APREPITANT 40 MG PO CAPS
ORAL_CAPSULE | ORAL | Status: AC
  Administered 2021-04-25: 40 mg via ORAL
  Filled 2021-04-25: qty 1

## 2021-04-25 MED ORDER — DEXMEDETOMIDINE HCL IN NACL 200 MCG/50ML IV SOLN
INTRAVENOUS | Status: AC
  Filled 2021-04-25: qty 50

## 2021-04-25 MED ORDER — CHLORHEXIDINE GLUCONATE 0.12 % MT SOLN
OROMUCOSAL | Status: AC
  Filled 2021-04-25: qty 15

## 2021-04-25 MED ORDER — LIDOCAINE HCL (PF) 2 % IJ SOLN
INTRAMUSCULAR | Status: AC
  Filled 2021-04-25: qty 5

## 2021-04-25 MED ORDER — METHYLPREDNISOLONE SODIUM SUCC 125 MG IJ SOLR
40.0000 mg | Freq: Once | INTRAMUSCULAR | Status: AC
  Administered 2021-04-25: 40 mg via INTRAVENOUS

## 2021-04-25 MED ORDER — METHYLPREDNISOLONE SODIUM SUCC 125 MG IJ SOLR
INTRAMUSCULAR | Status: AC
  Filled 2021-04-25: qty 2

## 2021-04-25 MED ORDER — METOCLOPRAMIDE HCL 10 MG PO TABS: 5.0000 mg | ORAL_TABLET | Freq: Three times a day (TID) | ORAL | Status: DC | PRN

## 2021-04-25 MED ORDER — METHOCARBAMOL 500 MG PO TABS: 500.0000 mg | ORAL_TABLET | Freq: Four times a day (QID) | ORAL | 1 refills | Status: DC | PRN

## 2021-04-25 MED ORDER — METOCLOPRAMIDE HCL 5 MG/ML IJ SOLN: 5.0000 mg | Freq: Three times a day (TID) | INTRAMUSCULAR | Status: DC | PRN

## 2021-04-25 MED ORDER — PROPOFOL 10 MG/ML IV BOLUS
INTRAVENOUS | Status: AC
  Filled 2021-04-25: qty 40

## 2021-04-25 MED ORDER — CEFAZOLIN SODIUM-DEXTROSE 1-4 GM/50ML-% IV SOLN
1.0000 g | INTRAVENOUS | Status: AC
  Administered 2021-04-25: 1 g via INTRAVENOUS

## 2021-04-25 MED ORDER — IOPAMIDOL (ISOVUE-M 200) INJECTION 41%: INTRAMUSCULAR | Status: DC | PRN

## 2021-04-25 MED ORDER — FENTANYL CITRATE (PF) 100 MCG/2ML IJ SOLN
INTRAMUSCULAR | Status: AC
  Administered 2021-04-25: 25 ug via INTRAVENOUS
  Filled 2021-04-25: qty 2

## 2021-04-25 MED ORDER — SODIUM CHLORIDE FLUSH 0.9 % IV SOLN
INTRAVENOUS | Status: AC
  Filled 2021-04-25: qty 20

## 2021-04-25 MED ORDER — PROPOFOL 10 MG/ML IV BOLUS
INTRAVENOUS | Status: DC | PRN
  Administered 2021-04-25 (×3): 10 mg via INTRAVENOUS

## 2021-04-25 MED ORDER — ONDANSETRON HCL 4 MG/2ML IJ SOLN: 4.0000 mg | Freq: Four times a day (QID) | INTRAMUSCULAR | Status: DC | PRN

## 2021-04-25 SURGICAL SUPPLY — 22 items
ADH SKN CLS APL DERMABOND .7 (GAUZE/BANDAGES/DRESSINGS) ×1
CEMENT KYPHON CX01A KIT/MIXER (Cement) ×2 IMPLANT
DERMABOND ADVANCED (GAUZE/BANDAGES/DRESSINGS) ×1
DERMABOND ADVANCED .7 DNX12 (GAUZE/BANDAGES/DRESSINGS) ×1 IMPLANT
DEVICE BIOPSY BONE KYPHX (INSTRUMENTS) ×2 IMPLANT
DRAPE C-ARM XRAY 36X54 (DRAPES) ×2 IMPLANT
DURAPREP 26ML APPLICATOR (WOUND CARE) ×2 IMPLANT
FEE RENTAL RFA GENERATOR (MISCELLANEOUS) IMPLANT
GAUZE 4X4 16PLY ~~LOC~~+RFID DBL (SPONGE) ×2 IMPLANT
GLOVE SURG SYN 9.0  PF PI (GLOVE) ×1
GLOVE SURG SYN 9.0 PF PI (GLOVE) ×1 IMPLANT
GOWN SRG 2XL LVL 4 RGLN SLV (GOWNS) ×1 IMPLANT
GOWN STRL NON-REIN 2XL LVL4 (GOWNS) ×2
GOWN STRL REUS W/ TWL LRG LVL3 (GOWN DISPOSABLE) ×1 IMPLANT
GOWN STRL REUS W/TWL LRG LVL3 (GOWN DISPOSABLE) ×2
MANIFOLD NEPTUNE II (INSTRUMENTS) ×2 IMPLANT
PACK KYPHOPLASTY (MISCELLANEOUS) ×2 IMPLANT
RENTAL RFA GENERATOR (MISCELLANEOUS) IMPLANT
STRAP SAFETY 5IN WIDE (MISCELLANEOUS) ×2 IMPLANT
SWABSTK COMLB BENZOIN TINCTURE (MISCELLANEOUS) ×2 IMPLANT
TRAY KYPHOPAK 15/3 EXPRESS 1ST (MISCELLANEOUS) ×2 IMPLANT
TRAY KYPHOPAK 20/3 EXPRESS 1ST (MISCELLANEOUS) ×1 IMPLANT

## 2021-04-25 NOTE — Op Note (Signed)
04/25/2021  3:52 PM  PATIENT:  Rachael Humphrey   MRN: [id number]   PRE-OPERATIVE DIAGNOSIS:  closed wedge compression fracture of L3   POST-OPERATIVE DIAGNOSIS:  closed wedge compression fracture of L3   PROCEDURE:  Procedure(s): KYPHOPLASTY L3  SURGEON: Laurene Footman, MD   ASSISTANTS: None   ANESTHESIA:   local and MAC   EBL:  No intake/output data recorded.   BLOOD ADMINISTERED:none   DRAINS: none    LOCAL MEDICATIONS USED:  MARCAINE    and XYLOCAINE    SPECIMEN: None   DISPOSITION OF SPECIMEN: Not applicable   COUNTS:  YES   TOURNIQUET:  * No tourniquets in log *   IMPLANTS: Bone cement   DICTATION: .Dragon Dictation  patient was brought to the operating room and after adequate anesthesia was obtained the patient was placed prone.  C arm was brought in in good visualization of the affected level obtained on both AP and lateral projections.  After patient identification and timeout procedures were completed, local anesthetic was infiltrated with 10 cc 1% Xylocaine infiltrated subcutaneously.  This is done the area on the each side of the planned approach.  The back was then prepped and draped in the usual sterile manner and repeat timeout procedure carried out.  A spinal needle was brought down to the pedicle on the each side of L3 and a 50-50 mix of 1% Xylocaine half percent Sensorcaine with epinephrine total of 20 cc injected on each side.  After allowing this to set a small incision was made and the trocar was advanced into the vertebral body in an extrapedicular fashion.  Biopsy was not obtained on either side drilling was carried out balloon inserted with inflation to 3 cc on the right and 2-1/2 cc on the left.  When the cement was appropriate consistency 4-1/2 cc were injected on the right and 3 cc on the left into the vertebral body with some extravasation into adjacent veins anteriorly but not posteriorly towards the nerves, good fill superior to inferior endplates and  from right to left sides along the inferior endplate.  After the cement had set the trochar was removed and permanent C-arm views obtained.  The wound was closed with Dermabond followed by Band-Aid   PLAN OF CARE: Discharge home after recovery room  PATIENT DISPOSITION:  PACU - hemodynamically stable.

## 2021-04-25 NOTE — Anesthesia Postprocedure Evaluation (Signed)
Anesthesia Post Note  Patient: SHERAY ERLING  Procedure(s) Performed: L 3 KYPHOPLASTY  Patient location during evaluation: PACU Anesthesia Type: General Level of consciousness: awake and alert Pain management: pain level controlled Vital Signs Assessment: post-procedure vital signs reviewed and stable Respiratory status: spontaneous breathing, nonlabored ventilation, respiratory function stable and patient connected to nasal cannula oxygen Cardiovascular status: blood pressure returned to baseline and stable Postop Assessment: no apparent nausea or vomiting Anesthetic complications: no   No notable events documented.   Last Vitals:  Vitals:   04/25/21 1700 04/25/21 1715  BP: 139/74 105/70  Pulse: 60 (!) 58  Resp: 16   Temp: 36.4 C 36.5 C  SpO2: 96% 97%    Last Pain:  Vitals:   04/25/21 1715  TempSrc: Temporal  PainSc: 3                  Martha Clan

## 2021-04-25 NOTE — Progress Notes (Signed)
Patient in recliner chair, states "back spasms"" less while sitting. Provider ordered Methocarbamol IV prior to discharge and po for home.  Also received '40mg'$  solumedrol IV prior to discharge. Reviewed discharge instructions and follow up at length with patient and patient's sister Charlcie Cradle. Patient and patient's sister verbalize understanding. Upon discharge pain level 4/10 able to ambulate in pacu without adverse reactions.  Tolerated snack prior to discharge.

## 2021-04-25 NOTE — Transfer of Care (Signed)
Immediate Anesthesia Transfer of Care Note  Patient: Rachael Humphrey  Procedure(s) Performed: L 3 KYPHOPLASTY  Patient Location: PACU  Anesthesia Type:MAC  Level of Consciousness: drowsy and patient cooperative  Airway & Oxygen Therapy: Patient Spontanous Breathing and Patient connected to nasal cannula oxygen  Post-op Assessment: Report given to RN and Post -op Vital signs reviewed and stable  Post vital signs: Reviewed and stable  Last Vitals:  Vitals Value Taken Time  BP 106/63 04/25/21 1550  Temp    Pulse 58 04/25/21 1552  Resp 14 04/25/21 1552  SpO2 98 % 04/25/21 1552  Vitals shown include unvalidated device data.  Last Pain:  Vitals:   04/25/21 1210  TempSrc: Temporal  PainSc: 0-No pain         Complications: No notable events documented.

## 2021-04-25 NOTE — Discharge Instructions (Addendum)
Take it easy today. Try to get up and walk is much as you can starting tomorrow but avoid lifting for 2 weeks Remove Band-Aids on Saturday then okay to shower Call office if you are having problems Resume Plavix tonightAMBULATORY SURGERY  DISCHARGE INSTRUCTIONS   The drugs that you were given will stay in your system until tomorrow so for the next 24 hours you should not:  Drive an automobile Make any legal decisions Drink any alcoholic beverage   You may resume regular meals tomorrow.  Today it is better to start with liquids and gradually work up to solid foods.  You may eat anything you prefer, but it is better to start with liquids, then soup and crackers, and gradually work up to solid foods.   Please notify your doctor immediately if you have any unusual bleeding, trouble breathing, redness and pain at the surgery site, drainage, fever, or pain not relieved by medication.    Additional Instructions:        Please contact your physician with any problems or Same Day Surgery at [phone], Monday through Friday 6 am to 4 pm, or [place] at [facility] number at [phone].

## 2021-04-25 NOTE — Anesthesia Preprocedure Evaluation (Addendum)
Anesthesia Evaluation  Patient identified by MRN, date of birth, ID band Patient awake    Reviewed: Allergy & Precautions, H&P , NPO status , Patient's Chart, lab work & pertinent test results, reviewed documented beta blocker date and time   History of Anesthesia Complications (+) PONVNegative for: history of anesthetic complications  Airway Mallampati: II  TM Distance: >3 FB     Dental  (+)    Pulmonary neg pulmonary ROS, neg sleep apnea, neg COPD,    breath sounds clear to auscultation       Cardiovascular hypertension, Pt. on medications and Pt. on home beta blockers (-) angina+ CAD, + Past MI and + Cardiac Stents (LAD)  (-) dysrhythmias  Rhythm:regular Rate:Normal  ECHO 11/2020: INTERPRETATION  NORMAL LEFT VENTRICULAR SYSTOLIC FUNCTION  NORMAL RIGHT VENTRICULAR SYSTOLIC FUNCTION  MILD VALVULAR REGURGITATION (See above)  NO VALVULAR STENOSIS  Mobility: PARTIALLY MOBILE  Mitral: MILD MR  Tricuspid: MILD TR  Closest EF: 50% (Estimated)  Dias.FxClass: (Grade 2) relaxation abnormal, pseudonormal  LVH: MILD LVH  Aortic: MILD AR     Neuro/Psych TIAnegative psych ROS   GI/Hepatic Neg liver ROS, GERD  Controlled,  Endo/Other  diabetes, Type 2Hypothyroidism   Renal/GU negative Renal ROS  negative genitourinary   Musculoskeletal   Abdominal Normal abdominal exam  (+)   Peds  Hematology negative hematology ROS (+)   Anesthesia Other Findings Past Medical History: 2005: Cancer ([place])     Comment:  colon cancer No date: Colon cancer ([place]) No date: Complication of anesthesia No date: Coronary artery disease No date: Diabetes mellitus without complication (HCC) No date: GERD (gastroesophageal reflux disease) No date: H/O hiatal hernia No date: Hemorrhoids     Comment:  internal-not a problem now. No date: History of kidney stones     Comment:  multiple No date: History of shingles     Comment:  04-18-14 3 weeks  now -pain only left posterior back , no               outbreaks No date: Hypertension No date: Hypothyroidism No date: PONV (postoperative nausea and vomiting)     Comment:  no recent problems No date: Stroke [facility])     Comment:  TIA  Past Surgical History: No date: APPENDECTOMY 1978: AUGMENTATION MAMMAPLASTY; Bilateral No date: BREAST BIOPSY; Right     Comment:  over 30 years ago negative No date: BREAST BIOPSY; Left     Comment:  negative over 30 years ago No date: CATARACT EXTRACTION, BILATERAL No date: COLECTOMY     Comment:  partial small and large instestine removed No date: CORONARY ANGIOPLASTY WITH STENT PLACEMENT     Comment:  02-23-14 coranary stent placed. 05/17/2014: CYSTOSCOPY WITH RETROGRADE PYELOGRAM, URETEROSCOPY AND  STENT PLACEMENT; Bilateral     Comment:  Procedure: CYSTOSCOPY WITH RETROGRADE PYELOGRAM,               URETEROSCOPY AND STENT PLACEMENT, BASKET STONE REMOVAL;                Surgeon: Alexis Frock, MD;  Location: [facility];  Service:              Urology;  Laterality: Bilateral; 11/25/2018: KYPHOPLASTY; N/A     Comment:  Procedure: KYPHOPLASTY T11, DIABETIC;  Surgeon: Hessie Knows, MD;  Location: [facility] ORS;  Service: Orthopedics;  Laterality: N/A;  BMI    Body Mass Index: 22.67 kg/m      Reproductive/Obstetrics negative OB ROS                           Anesthesia Physical  Anesthesia Plan  ASA: III  Anesthesia Plan: MAC   Post-op Pain Management:    Induction:   PONV Risk Score and Plan: Propofol infusion and TIVA  Airway Management Planned: Simple Face Mask  Additional Equipment:   Intra-op Plan:   Post-operative Plan:   Informed Consent: I have reviewed the patients History and Physical, chart, labs and discussed the procedure including the risks, benefits and alternatives for the proposed anesthesia with the patient or authorized representative who has indicated his/her  understanding and acceptance.     Dental Advisory Given  Plan Discussed with: Anesthesiologist and CRNA  Anesthesia Plan Comments:        Anesthesia Quick Evaluation
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
## 2021-04-26 ENCOUNTER — Encounter: Payer: Self-pay | Admitting: Orthopedic Surgery

## 2021-05-02 ENCOUNTER — Encounter: Payer: Self-pay | Admitting: Orthopedic Surgery

## 2021-10-02 ENCOUNTER — Ambulatory Visit (INDEPENDENT_AMBULATORY_CARE_PROVIDER_SITE_OTHER): Payer: Medicare Other | Admitting: Dermatology

## 2021-10-02 ENCOUNTER — Other Ambulatory Visit: Payer: Self-pay

## 2021-10-02 DIAGNOSIS — L570 Actinic keratosis: Secondary | ICD-10-CM | POA: Diagnosis not present

## 2021-10-02 DIAGNOSIS — T1490XA Injury, unspecified, initial encounter: Secondary | ICD-10-CM

## 2021-10-02 DIAGNOSIS — Z87828 Personal history of other (healed) physical injury and trauma: Secondary | ICD-10-CM

## 2021-10-02 DIAGNOSIS — L578 Other skin changes due to chronic exposure to nonionizing radiation: Secondary | ICD-10-CM

## 2021-10-02 DIAGNOSIS — L821 Other seborrheic keratosis: Secondary | ICD-10-CM

## 2021-10-02 DIAGNOSIS — L720 Epidermal cyst: Secondary | ICD-10-CM

## 2021-10-02 DIAGNOSIS — R200 Anesthesia of skin: Secondary | ICD-10-CM

## 2021-10-02 NOTE — Patient Instructions (Addendum)
If You Need Anything After Your Visit ° °If you have any questions or concerns for your doctor, please call our main line at [phone] and press option 4 to reach your doctor's medical assistant. If no one answers, please leave a voicemail as directed and we will return your call as soon as possible. Messages left after 4 pm will be answered the following business day.  ° °You may also send us a message via MyChart. We typically respond to MyChart messages within 1-2 business days. ° °For prescription refills, please ask your pharmacy to contact our office. Our fax number is [phone]. ° °If you have an urgent issue when the clinic is closed that cannot wait until the next business day, you can page your doctor at the number below.   ° °Please note that while we do our best to be available for urgent issues outside of office hours, we are not available 24/7.  ° °If you have an urgent issue and are unable to reach us, you may choose to seek medical care at your doctor's office, retail clinic, urgent care center, or emergency room. ° °If you have a medical emergency, please immediately call 911 or go to the emergency department. ° °Pager Numbers ° °- Dr. Kowalski: [phone] ° °- Dr. Moye: [phone] ° °- Dr. Stewart: [phone] ° °In the event of inclement weather, please call our main line at [phone] for an update on the status of any delays or closures. ° °Dermatology Medication Tips: °Please keep the boxes that topical medications come in in order to help keep track of the instructions about where and how to use these. Pharmacies typically print the medication instructions only on the boxes and not directly on the medication tubes.  ° °If your medication is too expensive, please contact our office at [phone] option 4 or send us a message through MyChart.  ° °We are unable to tell what your co-pay for medications will be in advance as this is different depending on your insurance coverage.  However, we may be able to find a substitute medication at lower cost or fill out paperwork to get insurance to cover a needed medication.  ° °If a prior authorization is required to get your medication covered by your insurance company, please allow us 1-2 business days to complete this process. ° °Drug prices often vary depending on where the prescription is filled and some pharmacies may offer cheaper prices. ° °The website www.goodrx.com contains coupons for medications through different pharmacies. The prices here do not account for what the cost may be with help from insurance (it may be cheaper with your insurance), but the website can give you the price if you did not use any insurance.  °- You can print the associated coupon and take it with your prescription to the pharmacy.  °- You may also stop by our office during regular business hours and pick up a GoodRx coupon card.  °- If you need your prescription sent electronically to a different pharmacy, notify our office through [facility] MyChart or by phone at [phone] option 4. ° ° ° ° °Si Usted Necesita Algo Después de Su Visita ° °También puede enviarnos un mensaje a través de MyChart. Por lo general respondemos a los mensajes de MyChart en el transcurso de 1 a 2 días hábiles. ° °Para renovar recetas, por favor pida a su farmacia que se ponga en contacto con nuestra oficina. Nuestro número de fax es el [phone]. ° °Si tiene   un asunto urgente cuando la clnica est cerrada y que no puede esperar hasta el siguiente da hbil, puede llamar/localizar a su doctor(a) al nmero que aparece a continuacin.   Por favor, tenga en cuenta que aunque hacemos todo lo posible para estar disponibles para asuntos urgentes fuera del horario de [place], no estamos disponibles las 24 horas del da, los 7 das de la [place].   Si tiene un problema urgente y no puede comunicarse con nosotros, puede optar por buscar atencin mdica  en el consultorio de su  doctor(a), en una clnica privada, en un centro de atencin urgente o en una sala de emergencias.  Si tiene Engineering geologist, por favor llame inmediatamente al 911 o vaya a la sala de emergencias.  Nmeros de bper  - Dr. Nehemiah Massed: [phone]  - Dra. Moye: [phone]  - Dra. Nicole Kindred: [phone]  En caso de inclemencias del [place], por favor llame a Johnsie Kindred principal al [phone] para una actualizacin sobre el [place] de cualquier retraso o cierre.  Consejos para la medicacin en dermatologa: Por favor, guarde las cajas en las que vienen los medicamentos de uso tpico para ayudarle a seguir las instrucciones sobre dnde y cmo usarlos. Las farmacias generalmente imprimen las instrucciones del medicamento slo en las cajas y no directamente en los tubos del [place].   Si su medicamento es muy caro, por favor, pngase en contacto con Zigmund Daniel llamando al [phone] y presione la opcin 4 o envenos un mensaje a travs de Pharmacist, community.   No podemos decirle cul ser su copago por los medicamentos por adelantado ya que esto es diferente dependiendo de la cobertura de su seguro. Sin embargo, es posible que podamos encontrar un medicamento sustituto a Electrical engineer un formulario para que el seguro cubra el medicamento que se considera necesario.   Si se requiere una autorizacin previa para que su compaa de seguros [place] su medicamento, por favor permtanos de 1 a 2 das hbiles para completar este proceso.  Los precios de los medicamentos varan con frecuencia dependiendo del Environmental consultant de dnde se surte la receta y alguna farmacias pueden ofrecer precios ms baratos.  El sitio web www.goodrx.com tiene cupones para medicamentos de Airline pilot. Los precios aqu no tienen en cuenta lo que podra costar con la ayuda del seguro (puede ser ms barato con su seguro), pero el sitio web puede darle el precio si no utiliz Research scientist (physical sciences).  - Puede imprimir el cupn  correspondiente y llevarlo con su receta a la farmacia.  - Tambin puede pasar por nuestra oficina durante el horario de atencin regular y Charity fundraiser una tarjeta de cupones de GoodRx.  - Si necesita que su receta se enve electrnicamente a una farmacia diferente, informe a nuestra oficina a travs de MyChart de [place] o por telfono llamando al [phone] y presione la opcin 4.   Seborrheic Keratosis  What causes seborrheic keratoses? Seborrheic keratoses are harmless, common skin growths that first appear during adult life.  As time goes by, more growths appear.  Some people may develop a large number of them.  Seborrheic keratoses appear on both covered and uncovered body parts.  They are not caused by sunlight.  The tendency to develop seborrheic keratoses can be inherited.  They vary in color from skin-colored to gray, brown, or even black.  They can be either smooth or have a rough, warty surface.   Seborrheic keratoses are superficial and look as if they were stuck on the skin.  Under the microscope this type of keratosis looks like layers upon layers of skin.  That is why at times the top layer may seem to fall off, but the rest of the growth remains and re-grows.    Treatment Seborrheic keratoses do not need to be treated, but can easily be removed in the office.  Seborrheic keratoses often cause symptoms when they rub on clothing or jewelry.  Lesions can be in the way of shaving.  If they become inflamed, they can cause itching, soreness, or burning.  Removal of a seborrheic keratosis can be accomplished by freezing, burning, or surgery. If any spot bleeds, scabs, or grows rapidly, please return to have it checked, as these can be an indication of a skin cancer.  Actinic Keratosis  What is an actinic keratosis? An actinic keratosis (plural: actinic keratoses) is growth on the surface of the skin that usually appears as a red, hard, crusty or scaly bump.   What causes actinic  keratoses? Repeated prolonged sun exposure causes skin damage, especially in fair-skinned persons. Sun-damaged skin becomes dry and wrinkled and may form rough, scaly spots called actinic keratoses. These rough spots remain on the skin even though the crust or scale on top is picked off.   Why treat actinic keratoses? Actinic keratoses are not skin cancers, but because they may sometimes turn cancerous they are called pre-cancerous. Not all will turn to skin cancer, and it usually takes several years for this to happen. Because it is much easier to treat an actinic keratosis then it is to remove a skin cancer, actinic keratoses should be treated to prevent future skin cancer.   How are actinic keratoses treated? The most common way of treating actinic keratoses is to freeze them with liquid nitrogen. Freezing causes scabbing and shedding of the sun-damaged skin. Healing after a removal usually takes two weeks, depending on the size and location of the keratosis. Hands and legs heal more slowly than the face. The skins final appearance is usually excellent. There are several topical medications that can be used to treat actinic keratoses. These medications generally have side effects of redness, crusting, and pain. Some are used for a few days, and some for several months before the actinic keratosis is completely gone. Photodynamic therapy is another alternative to freezing actinic keratoses. This treatment is done in a physicians office. A medication is applied to the area of skin with actinic keratoses, and it is allowed to soak in for one or more hours. A special light is then applied to the skin. Side effects include redness, burning, and peeling.  How can you prevent actinic keratoses? Protection from the sun is the best way to prevent actinic keratoses. The use of proper clothing and sunscreens can prevent the sun damage that leads to an actinic keratosis.  Unfortunately, some sun damage is  permanent. Once sun damage has progressed to the point where actinic keratoses develop, new keratoses may appear even without further sun exposure. However, even in skin that is already heavily sun damaged, good sun protection can help reduce the number of actinic keratoses that will appear.    Cryotherapy Aftercare  Wash gently with soap and water everyday.   Apply Vaseline and Band-Aid daily until healed.
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
## 2021-10-03 ENCOUNTER — Encounter: Payer: Self-pay | Admitting: Dermatology

## 2021-10-14 IMAGING — MR MR PELVIS W/O CM
4 of 5 series · 22 of 48 positions shown · non-contrast
Comparison: CT 11/19/2020

CLINICAL DATA: Right hip pain after fall 3 weeks ago. L4
kyphoplasty on 11/28/2020

EXAM:
MRI PELVIS WITHOUT CONTRAST
TECHNIQUE: Multiplanar multisequence MR imaging of the pelvis was performed. No
intravenous contrast was administered.

[Series 8: T1 · axial · 4.0mm · 0.74mm/px · z∈[-143,+111]mm · 9 of 52 slices shown (1 of 2)]
[im 1/52]
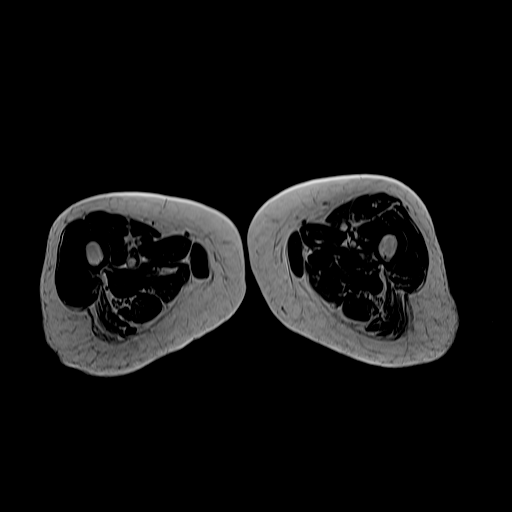
[im 7/52]
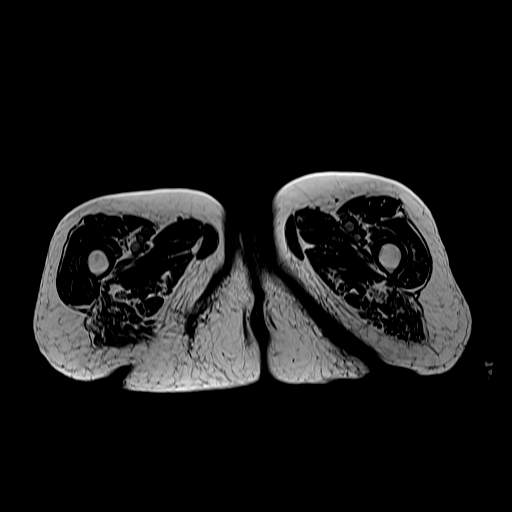
[im 13/52]
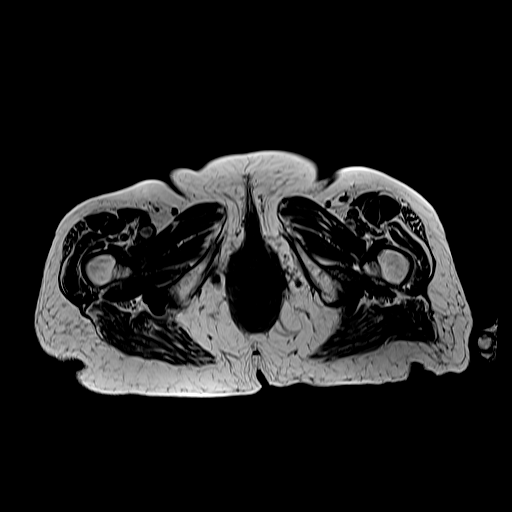
[im 20/52]
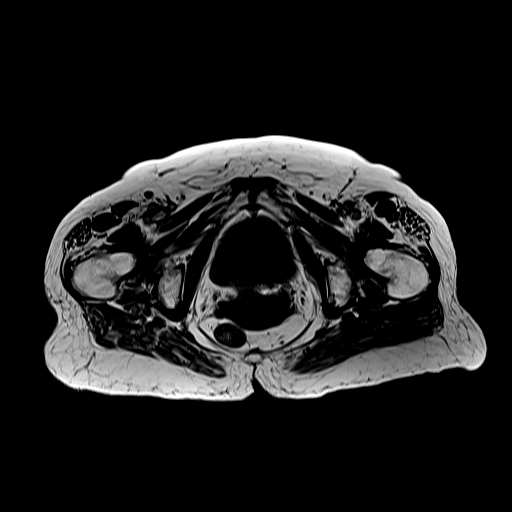
[im 26/52]
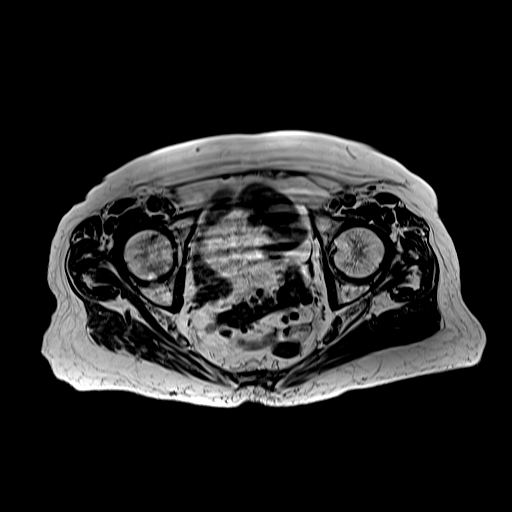
[im 32/52]
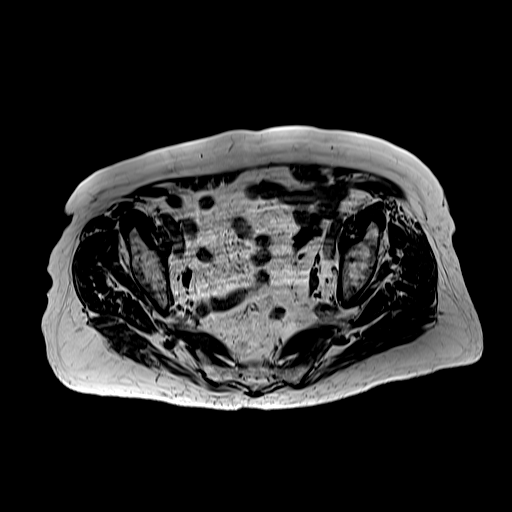
[im 39/52]
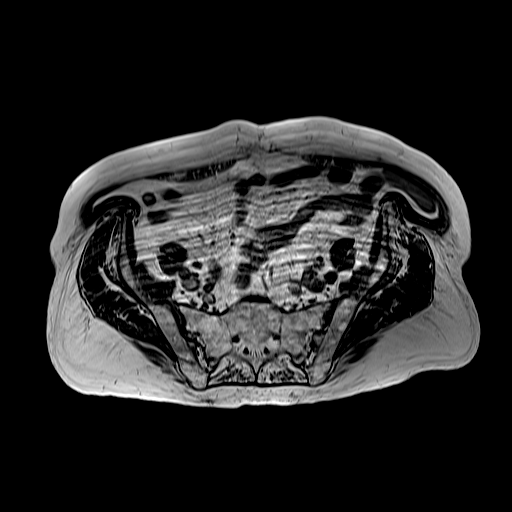
[im 45/52]
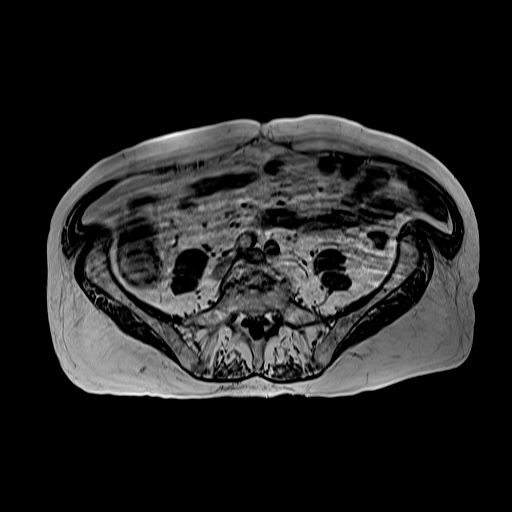
[im 52/52]
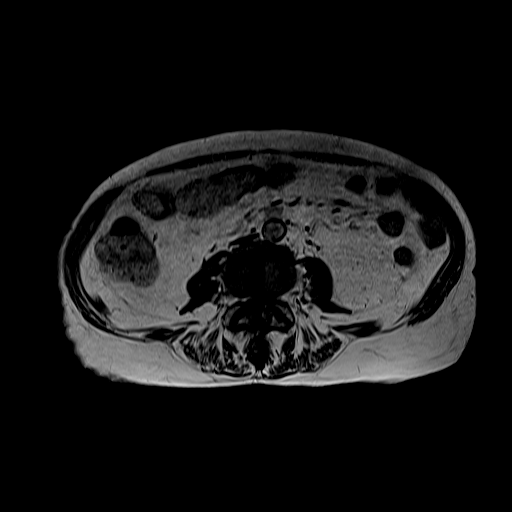

[Series 9: T2 fat-sat · axial · 4.0mm · 0.74mm/px · z∈[-143,+76]mm · 7 of 52 slices shown]
[im 1/52]
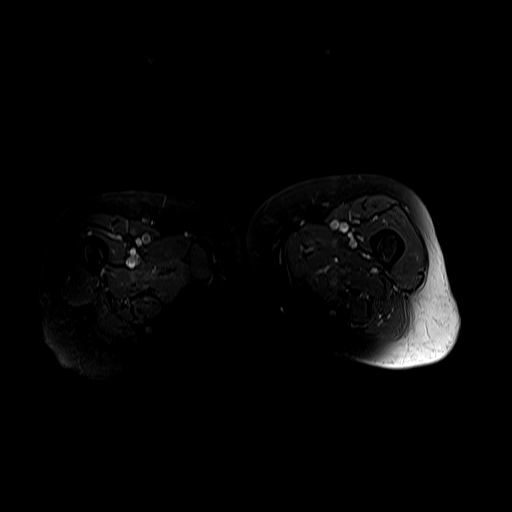
[im 7/52]
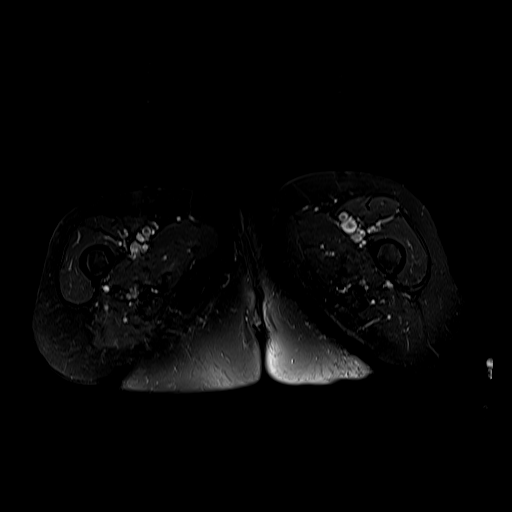
[im 13/52]
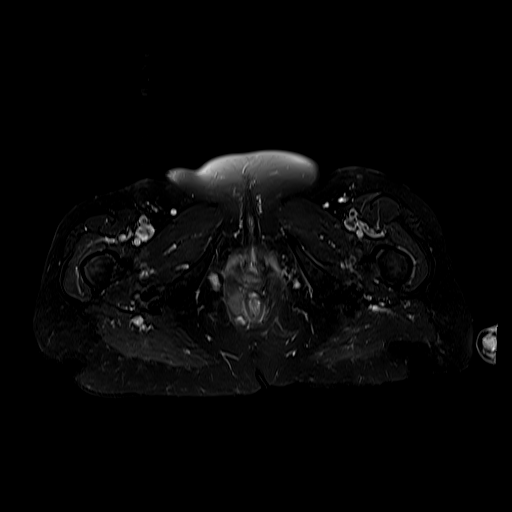
[im 20/52]
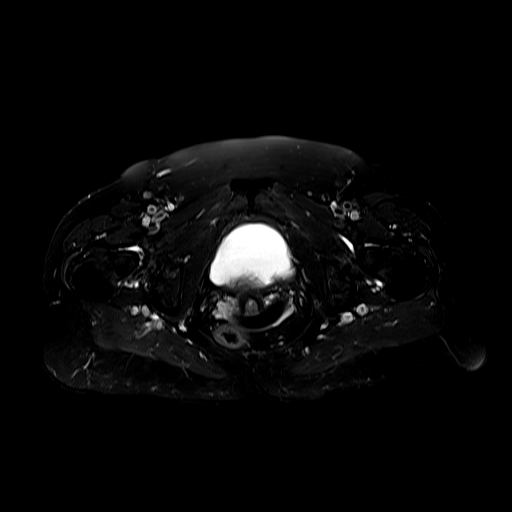
[im 26/52]
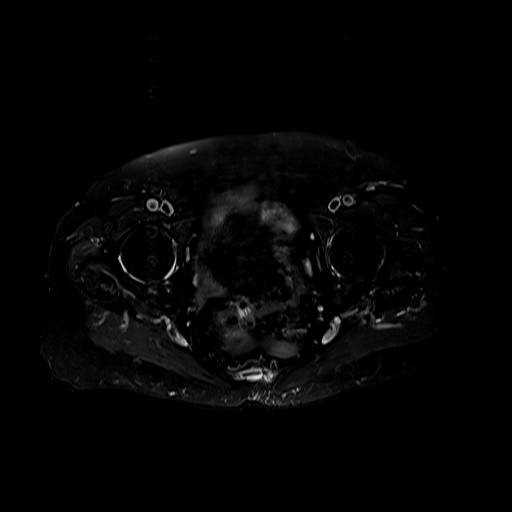
[im 32/52]
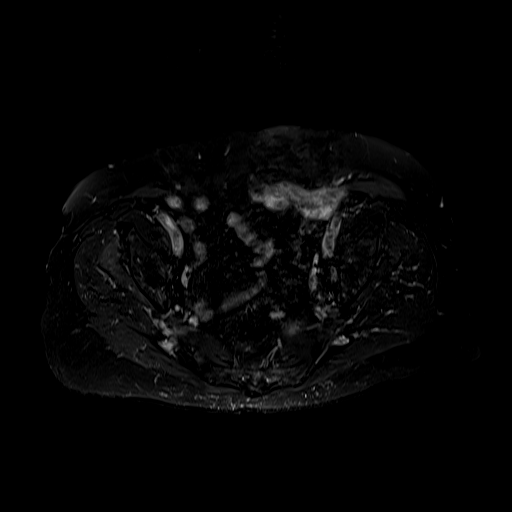
[im 45/52]
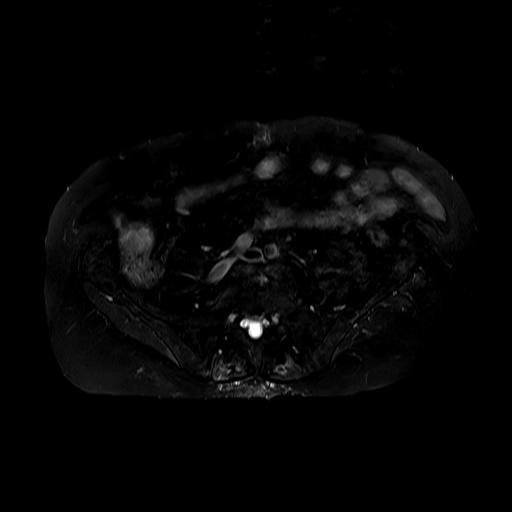

[Series 10: STIR · coronal · 4.0mm · 1.25mm/px · 3 of 40 slices shown]
[im 6/40]
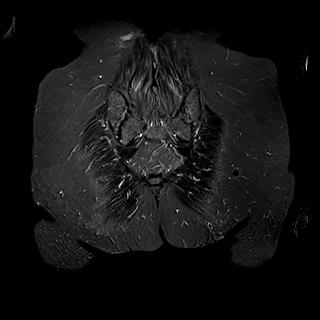
[im 23/40]
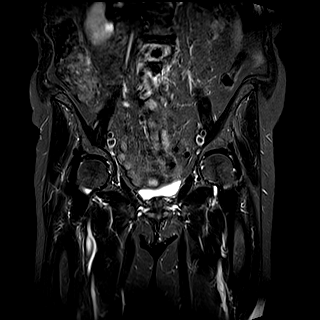
[im 34/40]
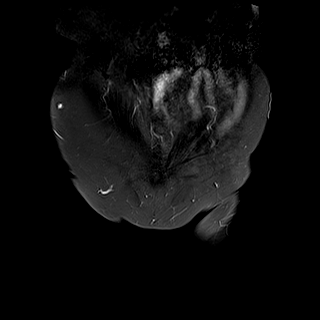

[Series 11: T1 · coronal · 4.0mm · 1.25mm/px · 3 of 40 slices shown (2 of 2)]
[im 6/40]
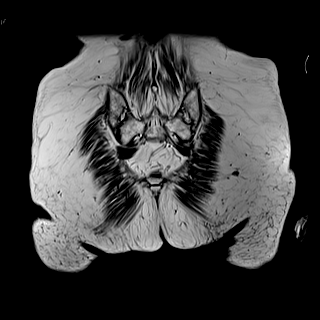
[im 23/40]
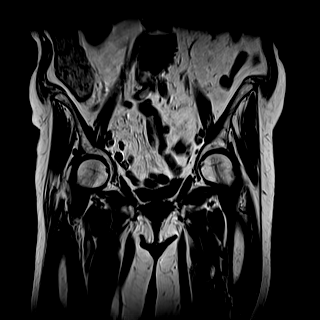
[im 34/40]
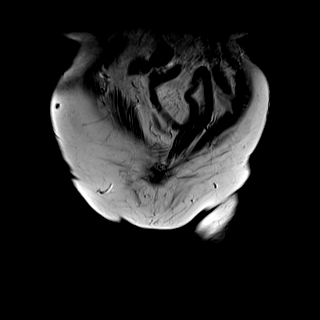

[22 of 48 positions shown; findings below may reference images not displayed]

FINDINGS: Urinary Tract:  No abnormality visualized.

Bowel:  Sigmoid diverticulosis.

Vascular/Lymphatic: No pathologically enlarged lymph nodes. No
significant vascular abnormality seen.

Reproductive:  Atrophic uterus.  No adnexal masses.

Other:  No free fluid within the pelvis.

Musculoskeletal: Transitional lumbosacral anatomy with subacute L4
vertebral body compression fracture status post cement augmentation.
Acute nondisplaced fracture involving the coccyx and distal most
sacral segment (series 12, images 36-37) with prominent bone marrow
edema.

Remaining osseous structures are intact. Bilateral hips intact
without fracture or dislocation. Mild bilateral hip joint space
narrowing. No hip joint effusion. Pubic symphysis and SI joints are
intact without diastasis. No marrow replacing bone lesion.

Mild intramuscular edema within the right gluteus minimus muscle
suggesting a mild muscle strain. Musculotendinous structures are
otherwise within normal limits. No soft tissue edema or fluid
collection.
IMPRESSION: 1. Acute nondisplaced fracture involving the coccyx and distal most
sacral segment.
2. Mild right gluteus minimus muscle strain.
3. Transitional lumbosacral anatomy with subacute L4 vertebral body
compression fracture status post cement augmentation.
4. No acute osseous abnormality of the right hip.

## 2021-10-25 ENCOUNTER — Other Ambulatory Visit: Payer: Self-pay | Admitting: Physician Assistant

## 2021-10-25 ENCOUNTER — Other Ambulatory Visit: Payer: Self-pay

## 2021-10-25 ENCOUNTER — Ambulatory Visit
Admission: RE | Admit: 2021-10-25 | Discharge: 2021-10-25 | Disposition: A | Discharge status: Home / Self Care | Payer: Medicare Other | Source: Ambulatory Visit | Attending: Physician Assistant | Admitting: Physician Assistant

## 2021-10-25 DIAGNOSIS — S0990XS Unspecified injury of head, sequela: Secondary | ICD-10-CM | POA: Diagnosis present

## 2022-03-22 ENCOUNTER — Emergency Department: Payer: Medicare Other

## 2022-03-22 ENCOUNTER — Observation Stay
Admission: EM | Admit: 2022-03-22 | Discharge: 2022-03-25 | Disposition: A | Discharge status: Home Health | Payer: Medicare Other | Attending: Internal Medicine | Admitting: Internal Medicine

## 2022-03-22 ENCOUNTER — Other Ambulatory Visit: Payer: Self-pay

## 2022-03-22 DIAGNOSIS — E039 Hypothyroidism, unspecified: Secondary | ICD-10-CM | POA: Diagnosis present

## 2022-03-22 DIAGNOSIS — W010XXA Fall on same level from slipping, tripping and stumbling without subsequent striking against object, initial encounter: Secondary | ICD-10-CM | POA: Insufficient documentation

## 2022-03-22 DIAGNOSIS — I2510 Atherosclerotic heart disease of native coronary artery without angina pectoris: Secondary | ICD-10-CM | POA: Diagnosis not present

## 2022-03-22 DIAGNOSIS — Z7902 Long term (current) use of antithrombotics/antiplatelets: Secondary | ICD-10-CM | POA: Diagnosis not present

## 2022-03-22 DIAGNOSIS — Z85038 Personal history of other malignant neoplasm of large intestine: Secondary | ICD-10-CM | POA: Insufficient documentation

## 2022-03-22 DIAGNOSIS — Z8673 Personal history of transient ischemic attack (TIA), and cerebral infarction without residual deficits: Secondary | ICD-10-CM | POA: Insufficient documentation

## 2022-03-22 DIAGNOSIS — E119 Type 2 diabetes mellitus without complications: Secondary | ICD-10-CM | POA: Insufficient documentation

## 2022-03-22 DIAGNOSIS — R262 Difficulty in walking, not elsewhere classified: Secondary | ICD-10-CM

## 2022-03-22 DIAGNOSIS — S32028A Other fracture of second lumbar vertebra, initial encounter for closed fracture: Secondary | ICD-10-CM | POA: Diagnosis not present

## 2022-03-22 DIAGNOSIS — S32020A Wedge compression fracture of second lumbar vertebra, initial encounter for closed fracture: Secondary | ICD-10-CM | POA: Diagnosis present

## 2022-03-22 DIAGNOSIS — I10 Essential (primary) hypertension: Secondary | ICD-10-CM | POA: Diagnosis present

## 2022-03-22 DIAGNOSIS — R2689 Other abnormalities of gait and mobility: Secondary | ICD-10-CM | POA: Insufficient documentation

## 2022-03-22 DIAGNOSIS — Z79899 Other long term (current) drug therapy: Secondary | ICD-10-CM | POA: Diagnosis not present

## 2022-03-22 DIAGNOSIS — S32040A Wedge compression fracture of fourth lumbar vertebra, initial encounter for closed fracture: Secondary | ICD-10-CM

## 2022-03-22 DIAGNOSIS — K219 Gastro-esophageal reflux disease without esophagitis: Secondary | ICD-10-CM | POA: Diagnosis present

## 2022-03-22 DIAGNOSIS — E785 Hyperlipidemia, unspecified: Secondary | ICD-10-CM | POA: Diagnosis present

## 2022-03-22 DIAGNOSIS — Z955 Presence of coronary angioplasty implant and graft: Secondary | ICD-10-CM | POA: Diagnosis not present

## 2022-03-22 DIAGNOSIS — M545 Low back pain, unspecified: Secondary | ICD-10-CM | POA: Diagnosis present

## 2022-03-22 LAB — COMPREHENSIVE METABOLIC PANEL
ALT: 17 U/L (ref 0–44)
AST: 22 U/L (ref 15–41)
Albumin: 4.1 g/dL (ref 3.5–5.0)
Alkaline Phosphatase: 73 U/L (ref 38–126)
Anion gap: 11 (ref 5–15)
BUN: 16 mg/dL (ref 8–23)
CO2: 21 mmol/L — ABNORMAL LOW (ref 22–32)
Calcium: 9.7 mg/dL (ref 8.9–10.3)
Chloride: 105 mmol/L (ref 98–111)
Creatinine, Ser: 0.65 mg/dL (ref 0.44–1.00)
GFR, Estimated: >60 mL/min (ref >60)
Glucose, Bld: 136 mg/dL — ABNORMAL HIGH (ref 70–99)
Potassium: 3.9 mmol/L (ref 3.5–5.1)
Sodium: 137 mmol/L (ref 135–145)
Total Bilirubin: 0.8 mg/dL (ref 0.3–1.2)
Total Protein: 7 g/dL (ref 6.5–8.1)

## 2022-03-22 LAB — CBC WITH DIFFERENTIAL/PLATELET
Abs Immature Granulocytes: 0.02 10*3/uL (ref 0.00–0.07)
Basophils Absolute: 0 10*3/uL (ref 0.0–0.1)
Basophils Relative: 1 %
Eosinophils Absolute: 0.1 10*3/uL (ref 0.0–0.5)
Eosinophils Relative: 1 %
HCT: 40.1 % (ref 36.0–46.0)
Hemoglobin: 13.7 g/dL (ref 12.0–15.0)
Immature Granulocytes: 0 %
Lymphocytes Relative: 26 %
Lymphs Abs: 1.6 10*3/uL (ref 0.7–4.0)
MCH: 31.9 pg (ref 26.0–34.0)
MCHC: 34.2 g/dL (ref 30.0–36.0)
MCV: 93.3 fL (ref 80.0–100.0)
Monocytes Absolute: 0.7 10*3/uL (ref 0.1–1.0)
Monocytes Relative: 11 %
Neutro Abs: 3.8 10*3/uL (ref 1.7–7.7)
Neutrophils Relative %: 61 %
Platelets: 155 10*3/uL (ref 150–400)
RBC: 4.3 MIL/uL (ref 3.87–5.11)
RDW: 12.9 % (ref 11.5–15.5)
WBC: 6.3 10*3/uL (ref 4.0–10.5)
nRBC: 0 % (ref 0.0–0.2)

## 2022-03-22 LAB — URINALYSIS, ROUTINE W REFLEX MICROSCOPIC
Bilirubin Urine: NEGATIVE
Glucose, UA: NEGATIVE mg/dL
Hgb urine dipstick: NEGATIVE
Ketones, ur: NEGATIVE mg/dL
Leukocytes,Ua: NEGATIVE
Nitrite: NEGATIVE
Protein, ur: NEGATIVE mg/dL
Specific Gravity, Urine: 1.016 (ref 1.005–1.030)
pH: 7 (ref 5.0–8.0)

## 2022-03-22 MED ORDER — MONTELUKAST SODIUM 10 MG PO TABS
10.0000 mg | ORAL_TABLET | Freq: Every day | ORAL | Status: DC
  Administered 2022-03-22 – 2022-03-24 (×3): 10 mg via ORAL
  Filled 2022-03-22 (×3): qty 1

## 2022-03-22 MED ORDER — ATORVASTATIN CALCIUM 20 MG PO TABS
20.0000 mg | ORAL_TABLET | Freq: Every day | ORAL | Status: DC
  Administered 2022-03-23 – 2022-03-25 (×3): 20 mg via ORAL
  Filled 2022-03-22 (×4): qty 1

## 2022-03-22 MED ORDER — ONDANSETRON HCL 4 MG/2ML IJ SOLN: 4.0000 mg | Freq: Four times a day (QID) | INTRAMUSCULAR | Status: DC | PRN

## 2022-03-22 MED ORDER — OYSTER SHELL CALCIUM/D3 500-5 MG-MCG PO TABS
1.0000 | ORAL_TABLET | Freq: Every day | ORAL | Status: DC
  Administered 2022-03-22 – 2022-03-25 (×4): 1 via ORAL
  Filled 2022-03-22 (×4): qty 1

## 2022-03-22 MED ORDER — ACETAMINOPHEN 325 MG PO TABS: 650.0000 mg | ORAL_TABLET | Freq: Four times a day (QID) | ORAL | Status: DC | PRN

## 2022-03-22 MED ORDER — ADULT MULTIVITAMIN W/MINERALS CH
1.0000 | ORAL_TABLET | Freq: Every day | ORAL | Status: DC
  Administered 2022-03-22 – 2022-03-25 (×4): 1 via ORAL
  Filled 2022-03-22 (×4): qty 1

## 2022-03-22 MED ORDER — ACETAMINOPHEN 650 MG RE SUPP: 650.0000 mg | Freq: Four times a day (QID) | RECTAL | Status: DC | PRN

## 2022-03-22 MED ORDER — SENNA 8.6 MG PO TABS
1.0000 | ORAL_TABLET | Freq: Two times a day (BID) | ORAL | Status: DC
  Administered 2022-03-22 – 2022-03-25 (×3): 8.6 mg via ORAL
  Filled 2022-03-22 (×5): qty 1

## 2022-03-22 MED ORDER — IOHEXOL 300 MG/ML  SOLN
100.0000 mL | Freq: Once | INTRAMUSCULAR | Status: AC | PRN
  Administered 2022-03-22: 100 mL via INTRAVENOUS

## 2022-03-22 MED ORDER — PANTOPRAZOLE SODIUM 40 MG PO TBEC
40.0000 mg | DELAYED_RELEASE_TABLET | Freq: Every day | ORAL | Status: DC
Start: 2022-03-22 — End: 2022-03-25
  Administered 2022-03-22 – 2022-03-25 (×4): 40 mg via ORAL
  Filled 2022-03-22 (×4): qty 1

## 2022-03-22 MED ORDER — METOPROLOL SUCCINATE ER 50 MG PO TB24
75.0000 mg | ORAL_TABLET | Freq: Every morning | ORAL | Status: DC
  Filled 2022-03-22: qty 1

## 2022-03-22 MED ORDER — ACETAMINOPHEN 500 MG PO TABS
1000.0000 mg | ORAL_TABLET | Freq: Three times a day (TID) | ORAL | Status: DC
  Administered 2022-03-22 – 2022-03-25 (×7): 1000 mg via ORAL
  Filled 2022-03-22 (×8): qty 2

## 2022-03-22 MED ORDER — LEVOTHYROXINE SODIUM 25 MCG PO TABS
25.0000 ug | ORAL_TABLET | Freq: Every day | ORAL | Status: DC
  Administered 2022-03-23 – 2022-03-25 (×3): 25 ug via ORAL
  Filled 2022-03-22 (×3): qty 1

## 2022-03-22 MED ORDER — LORATADINE 10 MG PO TABS
10.0000 mg | ORAL_TABLET | Freq: Every day | ORAL | Status: DC
  Administered 2022-03-22 – 2022-03-25 (×2): 10 mg via ORAL
  Filled 2022-03-22 (×4): qty 1

## 2022-03-22 MED ORDER — NIFEDIPINE ER OSMOTIC RELEASE 30 MG PO TB24
30.0000 mg | ORAL_TABLET | Freq: Every day | ORAL | Status: DC
Start: 2022-03-22 — End: 2022-03-23
  Filled 2022-03-22 (×2): qty 1

## 2022-03-22 MED ORDER — CLOPIDOGREL BISULFATE 75 MG PO TABS
75.0000 mg | ORAL_TABLET | Freq: Every day | ORAL | Status: DC
  Administered 2022-03-23 – 2022-03-25 (×3): 75 mg via ORAL
  Filled 2022-03-22 (×3): qty 1

## 2022-03-22 MED ORDER — POLYETHYLENE GLYCOL 3350 17 G PO PACK: 17.0000 g | PACK | Freq: Every day | ORAL | Status: DC | PRN

## 2022-03-22 MED ORDER — BISACODYL 5 MG PO TBEC: 5.0000 mg | DELAYED_RELEASE_TABLET | Freq: Every day | ORAL | Status: DC | PRN

## 2022-03-22 MED ORDER — LOSARTAN POTASSIUM 25 MG PO TABS
25.0000 mg | ORAL_TABLET | Freq: Every day | ORAL | Status: DC
Start: 2022-03-22 — End: 2022-03-23
  Filled 2022-03-22 (×2): qty 1

## 2022-03-22 MED ORDER — ENOXAPARIN SODIUM 40 MG/0.4ML IJ SOSY
40.0000 mg | PREFILLED_SYRINGE | INTRAMUSCULAR | Status: DC
  Administered 2022-03-22 – 2022-03-24 (×3): 40 mg via SUBCUTANEOUS
  Filled 2022-03-22 (×3): qty 0.4

## 2022-03-22 MED ORDER — ONDANSETRON HCL 4 MG PO TABS: 4.0000 mg | ORAL_TABLET | Freq: Four times a day (QID) | ORAL | Status: DC | PRN

## 2022-03-22 MED ORDER — OCUVITE-LUTEIN PO CAPS
1.0000 | ORAL_CAPSULE | Freq: Two times a day (BID) | ORAL | Status: DC
  Administered 2022-03-22 – 2022-03-25 (×5): 1 via ORAL
  Filled 2022-03-22 (×6): qty 1

## 2022-03-22 NOTE — ED Triage Notes (Addendum)
Patient to ER via POV with family. Reports having a ground level, mechanical fall approx 1 week ago. Patient reports hx of multiple lower back surgeries, was told by her doc tor that she is no longer a candidate for further intervention. Patient hx of osteoporosis. Reports that she is also constipated, states that she believes her last bowel movement was Monday.   Patient has been taking tylenol without relief.

## 2022-03-22 NOTE — ED Notes (Signed)
See triage note. Pt has chronic back pain with hx multiple surgeries and spinal stenosis. 22g IV placed for CT with contrast. Pt tolerated poorly. Pt states pain is tolerable while laying supine.

## 2022-03-22 NOTE — H&P (Addendum)
H&P:    Rachael Humphrey   DTO:[id number] DOB: 05-29-1930 DOA: 03/22/2022  PCP: Idelle Crouch, MD  Chief Complaint: fall, low back pain, unable to ambulate   History of Present Illness:    HPI: Rachael Humphrey is a 88 y.o. female with a past medical history of osteoporosis, coronary artery disease s/p PCI, history of colon cancer, hypertension, hyperlipidemia, hypothyroidism, history of kyphoplasty.  This patient presents to the emergency department due to back pain worsened after ground-level mechanical fall about a week ago.  She was in the kitchen getting ready to go to church when her feet got tangled up and she fell.  Since then her pain has worsened and she has been unable to ambulate due to her pain.  Normally pretty high functioning at home.  Occasionally uses a cane to assist with ambulation.  She lives alone at home but family checks on her daily.  Requests no opioid pain medications due to getting very sedated.  Denies any saddle anesthesia.  No urinary incontinence.  No stool incontinence.  No fevers or chills. Previous kyphoplasty by Dr. Rudene Christians.  ED Course: Labs were obtained which were fairly unremarkable.  Vital signs are stable.  Urinalysis was negative as well.  MRIs showing an acute L2 compression fracture.    ROS:   13 point review of systems is negative except for what is mentioned above in the HPI.   Past Medical History:   Past Medical History:  Diagnosis Date   Adenocarcinoma, colon ([facility]) 2005   Biatrial enlargement    Chronic anticoagulation    Clopidogrel   Complication of anesthesia    Coronary artery disease    a.) 2.75 x 28 mm Vision BMS to pLAD on 02/23/2014   GERD (gastroesophageal reflux disease)    Grade II diastolic dysfunction    H/O hiatal hernia    Hemorrhoids    internal-not a problem now.   History of kidney stones    multiple   History of methicillin resistant staphylococcus aureus (MRSA) 2020   History of shingles 04/18/2014    HLD (hyperlipidemia)    Hypertension    Hypothyroidism    Myocardial infarction (HCC)    PONV (postoperative nausea and vomiting)    Senile osteoporosis    T2DM (type 2 diabetes mellitus) (HCC)    TIA (transient ischemic attack)    Valvular regurgitation    panvalvular    Past Surgical History:   Past Surgical History:  Procedure Laterality Date   APPENDECTOMY     AUGMENTATION MAMMAPLASTY Bilateral 1978   BREAST BIOPSY Right    over 30 years ago negative   BREAST BIOPSY Left    negative over 30 years ago   CATARACT EXTRACTION, BILATERAL     COLECTOMY     partial small and large instestine removed   CORONARY ANGIOPLASTY WITH STENT PLACEMENT N/A 02/23/2014   2.75 x 28 mm Vision BMS x 1 to pLAD   CYSTOSCOPY WITH RETROGRADE PYELOGRAM, URETEROSCOPY AND STENT PLACEMENT Bilateral 05/17/2014   Procedure: CYSTOSCOPY WITH RETROGRADE PYELOGRAM, URETEROSCOPY AND STENT PLACEMENT, BASKET STONE REMOVAL;  Surgeon: Alexis Frock, MD;  Location: [facility];  Service: Urology;  Laterality: Bilateral;   KYPHOPLASTY N/A 11/25/2018   Procedure: KYPHOPLASTY T11, DIABETIC;  Surgeon: Hessie Knows, MD;  Location: [facility];  Service: Orthopedics;  Laterality: N/A;   KYPHOPLASTY N/A 11/28/2020   Procedure: [id number]  USE VASCULAR BED;  Surgeon: Hessie Knows, MD;  Location: [facility];  Service: Orthopedics;  Laterality: N/A;   KYPHOPLASTY N/A 04/25/2021   Procedure: L 3 KYPHOPLASTY;  Surgeon: Hessie Knows, MD;  Location: [facility];  Service: Orthopedics;  Laterality: N/A;    Social History:   Social History   Socioeconomic History   Marital status: Widowed    Spouse name: Not on file   Number of children: Not on file   Years of education: Not on file   Highest education level: Not on file  Occupational History   Not on file  Tobacco Use   Smoking status: Never   Smokeless tobacco: Never  Vaping Use   Vaping Use: Never used  Substance and Sexual Activity   Alcohol use: No   Drug use: No    Sexual activity: Yes  Other Topics Concern   Not on file  Social History Narrative   Lives alone but two sisters are close by to help.   Social Determinants of Health   Financial Resource Strain: Not on file  Food Insecurity: Not on file  Transportation Needs: Not on file  Physical Activity: Not on file  Stress: Not on file  Social Connections: Not on file  Intimate Partner Violence: Not on file    Allergies:   Allergies  Allergen Reactions   Codeine Nausea And Vomiting   Darvon [Propoxyphene] Other (See Comments)    Altered mental status     Demerol [Meperidine] Other (See Comments)    Altered mental status     Gabapentin Other (See Comments)   Sulfa Antibiotics Other (See Comments)   Tramadol Other (See Comments)    "makes me crazy"  "makes me crazy"    Voltaren [Diclofenac Sodium] Other (See Comments)    "Makes me crazy" "Makes me crazy"   Zinc     Hurting in lower stomach     Family History:   Family History  Problem Relation Age of Onset   Stroke Mother    Breast cancer Sister 22     Current Medications:   Prior to Admission medications   Medication Sig Start Date End Date Taking? Authorizing Provider  atorvastatin (LIPITOR) 20 MG tablet Take 20 mg by mouth daily. Reported on 09/18/2015    [provider]  baclofen (LIORESAL) 10 MG tablet Take 10 mg by mouth 3 (three) times daily as needed for muscle spasms. Patient not taking: Reported on 03/22/2022 11/20/20   [provider]  Calcium Carb-Cholecalciferol (CALCIUM 600 + D PO) Take 1 tablet by mouth daily.    [provider]  cetirizine (ZYRTEC) 10 MG tablet Take 10 mg by mouth daily. 07/18/16   [provider]  clopidogrel (PLAVIX) 75 MG tablet Take 75 mg by mouth daily.  11/12/15   [provider]  fluticasone (FLONASE) 50 MCG/ACT nasal spray Place 2 sprays into both nostrils daily as needed for allergies.  11/12/15   [provider]  levothyroxine  (SYNTHROID, LEVOTHROID) 25 MCG tablet Take 25 mcg by mouth daily before breakfast.  12/10/16   [provider]  lidocaine (LIDODERM) 5 % Place 1 patch onto the skin daily. Remove & Discard patch within 12 hours or as directed by MD Patient not taking: Reported on 03/22/2022 11/15/20   Christy Gentles, MD  losartan (COZAAR) 25 MG tablet Take 25 mg by mouth daily.    [provider]  methocarbamol (ROBAXIN) 500 MG tablet Take 1 tablet (500 mg total) by mouth every 6 (six) hours as needed for muscle spasms. Patient not taking: Reported on 03/22/2022 04/25/21  Hessie Knows, MD  metoprolol succinate (TOPROL-XL) 50 MG 24 hr tablet Take 75 mg by mouth every morning.     [provider]  montelukast (SINGULAIR) 10 MG tablet Take 10 mg by mouth at bedtime.  07/18/16   [provider]  Multiple Vitamin (MULTIVITAMIN WITH MINERALS) TABS tablet Take 1 tablet by mouth daily.    [provider]  Multiple Vitamins-Minerals (PRESERVISION AREDS 2 PO) Take 1 tablet by mouth 2 (two) times daily.     [provider]  mupirocin ointment (BACTROBAN) 2 % Place 1 application into the nose 2 (two) times daily. Patient not taking: Reported on 03/22/2022 04/10/19   Epifanio Lesches, MD  NIFEdipine (PROCARDIA-XL/NIFEDICAL-XL) 30 MG 24 hr tablet Take 30 mg by mouth daily. 11/06/18   [provider]  pantoprazole (PROTONIX) 40 MG tablet Take 40 mg by mouth daily.  08/13/16   [provider]     Physical Exam:   Vitals:   03/22/22 1050 03/22/22 1052 03/22/22 1349 03/22/22 1600  BP: (!) 162/86  (!) 182/105 (!) 141/90  Pulse: 75  72 75  Resp: '18  18 16  '$ Temp: 97.7 F (36.5 C)  97.8 F (36.6 C) (!) 97.5 F (36.4 C)  TempSrc: Oral  Oral Oral  SpO2: 97%  92% 91%  Weight:  54.4 kg    Height:  '5\' 1"'$  (1.549 m)       General:  Appears calm and comfortable and is in NAD Cardiovascular:  RRR, no m/r/g.  Respiratory:   CTA bilaterally with no  wheezes/rales/rhonchi.  Normal respiratory effort. Abdomen:  soft, NT, ND, NABS Skin:  no rash or induration seen on limited exam Musculoskeletal:  grossly normal tone BUE/BLE, good ROM, no bony abnormality Lower extremity:  No LE edema.  Limited foot exam with no ulcerations.  2+ distal pulses.  Neurovascularly intact in the lower extremities. Psychiatric:  grossly normal mood and affect, speech fluent and appropriate, AOx3 Neurologic:  CN 2-12 grossly intact, moves all extremities in coordinated fashion, sensation intact    Data Review:    Radiological Exams on Admission: Independently reviewed - see discussion in A/P where applicable  MR LUMBAR SPINE WO CONTRAST  Result Date: 03/22/2022 CLINICAL DATA:  Chronic low back pain.  [place] a week ago. EXAM: MRI LUMBAR SPINE WITHOUT CONTRAST TECHNIQUE: Multiplanar, multisequence MR imaging of the lumbar spine was performed. No intravenous contrast was administered. COMPARISON:  CT lumbar spine from same day. MRI lumbar spine dated April 16, 2021. FINDINGS: Segmentation: Transitional lumbosacral anatomy again noted with lumbarization of S1. Alignment:  Unchanged trace retrolisthesis at L1-L2. Vertebrae: Acute appearing L2 superior endplate compression fracture with 15% height loss and mild marrow edema. No retropulsion. Chronic T11, T12, L3, and L4 compression deformities status post cement augmentation. Conus medullaris and cauda equina: Conus extends to the L1-L2 level. Conus and cauda equina appear normal. Paraspinal and other soft tissues: Negative. Disc levels: T12-L1: Unchanged mild disc bulging with left paracentral endplate spurring. No stenosis. L1-L2: Unchanged mild disc bulging and bilateral facet arthropathy. Unchanged mild right neuroforaminal stenosis. No spinal canal or left neuroforaminal stenosis. L2-L3: Unchanged mild foraminal disc bulging and mild bilateral facet arthropathy. No stenosis. L3-L4: Unchanged mild disc bulging with moderate  right and mild left facet arthropathy. Unchanged mild bilateral lateral recess stenosis. No spinal canal or neuroforaminal stenosis. L4-L5: Unchanged small broad-based posterior disc protrusion and severe bilateral facet arthropathy with resultant moderate to severe spinal canal stenosis and severe right neuroforaminal stenosis.  No left neuroforaminal stenosis. L5-S1: Transitional level. Unchanged mild left neuroforaminal stenosis due to foraminal endplate spurring and left facet arthropathy. No spinal canal or right neuroforaminal stenosis. IMPRESSION: 1. Acute L2 superior endplate compression fracture with 15% height loss. No retropulsion. 2. Chronic T11, T12, L3, and L4 compression deformities status post cement augmentation. 3. Unchanged multilevel lumbar spondylosis as described above, worst at L4-L5 where there is moderate to severe spinal canal stenosis and severe right neuroforaminal stenosis. Electronically Signed   By: Titus Dubin M.D.   On: 03/22/2022 17:51   CT L-SPINE NO CHARGE  Result Date: 03/22/2022 CLINICAL DATA:  Mechanical fall EXAM: CT LUMBAR SPINE WITHOUT CONTRAST TECHNIQUE: Multidetector CT imaging of the lumbar spine was performed without intravenous contrast administration. Multiplanar CT image reconstructions were also generated. RADIATION DOSE REDUCTION: This exam was performed according to the departmental dose-optimization program which includes automated exposure control, adjustment of the mA and/or kV according to patient size and/or use of iterative reconstruction technique. COMPARISON:  MRI lumbar spine 04/16/2021, radiograph 04/25/2021. FINDINGS: Segmentation: There is a transitional lumbosacral vertebrae with lumbarized S1 and rudimentary disc at S1-S2. Numbering is consistent with prior MRI 04/16/2021. Independent review of these levels should be performed prior to any planned intervention. Alignment: Trace retrolisthesis at L1-L2, unchanged. Vertebrae: Osteopenia. There are  chronic compression deformities with prior vertebroplasties involving T11, T12, L3, and L4. There is no evidence of progressive height loss. There is a new compression fracture of L2 with approximately 20% height loss. There is a fracture line disrupting the superior and inferior endplates. Paraspinal and other soft tissues: Reported separately on CT abdomen and pelvis. Disc levels: There is multilevel degenerative disc disease and facet arthropathy, similar resulting in varying degrees of spinal canal neural foraminal stenosis. This is most prominent at L4-L5 where there is severe spinal canal stenosis and severe right neural foraminal stenosis, as demonstrated on prior lumbar spine MRI, not significantly changed. IMPRESSION: New compression fracture of L2 with approximately 20% height loss. Multilevel degenerative disc disease and facet arthropathy, most pronounced at L4-L5 with severe spinal canal stenosis and right neural foraminal stenosis, similar to prior MRI. Unchanged chronic compression deformities of T11, T12, L3, and L4 with prior vertebroplasties. Electronically Signed   By: Maurine Simmering M.D.   On: 03/22/2022 15:13   CT ABDOMEN PELVIS W CONTRAST  Result Date: 03/22/2022 CLINICAL DATA:  Abdominal pain, acute, nonlocalized EXAM: CT ABDOMEN AND PELVIS WITH CONTRAST TECHNIQUE: Multidetector CT imaging of the abdomen and pelvis was performed using the standard protocol following bolus administration of intravenous contrast. RADIATION DOSE REDUCTION: This exam was performed according to the departmental dose-optimization program which includes automated exposure control, adjustment of the mA and/or kV according to patient size and/or use of iterative reconstruction technique. CONTRAST:  130m OMNIPAQUE IOHEXOL 300 MG/ML  SOLN COMPARISON:  CT pelvis 11/19/2020, CT abdomen pelvis 04/01/2014 lumbar spine 04/16/2021 FINDINGS: Lower chest: Bibasilar scarring and atelectasis. Cardiomegaly. Mild peripheral  fibrotic change. Bilateral breast implants with calcified capsules. Hepatobiliary: No focal liver abnormality is seen. The gallbladder is unremarkable. Pancreas: Unremarkable. No pancreatic ductal dilatation or surrounding inflammatory changes. Spleen: Normal in size without focal abnormality. Adrenals/Urinary Tract: Stable 1.8 cm left adrenal nodule since July 2015, likely a benign adenoma. The right adrenal gland is normal. There is prominence of the renal, right collecting systems bilaterally greater than left, with decompressed ureters and no obstructing stone identified. No perinephric or periureteral stranding. There are small bilateral renal cysts too small to characterize.  There is a punctate nonobstructive stone in the right upper pole and a 3 mm stone in the right lower pole. There is a 3 mm stone in the lower pole the left kidney which is nonobstructive. The bladder is markedly distended. Stomach/Bowel: Large hiatal hernia. There is no evidence of bowel obstruction.Postsurgical changes in the region of the cecum. The appendix is surgically absent. No pericecal inflammation. Extensive sigmoid diverticulosis. No diverticulitis. Moderate stool burden. Vascular/Lymphatic: Aortoiliac atherosclerosis. No AAA. No lymphadenopathy. Reproductive: There is a pessary in place.  No adnexal mass. Other: Fat containing ventral lower abdominal wall hernia with a 2.0 cm aperture, and stranding within the herniated fat. There is a left periumbilical fat containing hernia with a 1.8 cm aperture. No bowel containing hernia. No ascites. No free air. Musculoskeletal: There is a transitional lumbosacral vertebrae present, likely lumbarized S1 with rudimentary disc at S1-S2. Numbering is consistent with prior lumbar spine MRI 04/16/2021. There are multiple chronic compression deformities with vertebroplasties, including T11, T12, L3, and L4. There is a new compression fracture of L2 with approximately 20% height loss. There is a  fracture line disrupting the superior and inferior endplate cortex (series 513, image 58). There is severe inter spinous degenerative change and multilevel facet arthropathy. There is multilevel degenerative disc disease with most severe disc height loss at L5-S1. No suspicious osseous lesion. Diffuse osteopenia. IMPRESSION: Prominence of the renal collecting systems bilaterally, likely due to a markedly distended urinary bladder. Bilateral nonobstructive nephrolithiasis without obstructing ureter stone identified. Fat containing ventral lower abdominal wall hernia with some stranding within the herniated fat, which can be seen with fat ischemia. Correlate with point tenderness and reducibility. No bowel containing hernia. New compression fracture of L2 with approximately 20% height loss. Postsurgical changes in the right lower quadrant. No bowel obstruction. Sigmoid diverticulosis without evidence of acute diverticulitis. Large hiatal hernia. Cardiomegaly.  Mild peripheral fibrotic change in the lung bases. Electronically Signed   By: Maurine Simmering M.D.   On: 03/22/2022 15:08   DG Lumbar Spine Complete  Result Date: 03/22/2022 CLINICAL DATA:  Fall. EXAM: LUMBAR SPINE - COMPLETE 4+ VIEW COMPARISON:  04/25/2021 FINDINGS: Bones are markedly demineralized. Status post vertebral augmentation at T11, T12, L3, and L4. No evidence for acute fracture involving the on augmented levels. SI joints unremarkable. Loss of disc height noted T12-L1 and L5-S1. IMPRESSION: 1. Marked osteopenia with multiple vertebral augmentation. No acute fracture evident. 2. Degenerative disc disease at T12-L1 and L5-S1. Electronically Signed   By: Misty Stanley M.D.   On: 03/22/2022 11:51      Labs on Admission: I have personally reviewed the available labs and imaging studies at the time of the admission.  Pertinent labs on Admission: None     Assessment/Plan:   Acute L2 compression fracture: MRI shows acute L2 superior endplate  compression fracture with 15% height loss. No retropulsion. PT will be consulted.  Discussed with NSGY Dr. Izora Ribas. He recommends no kyphoplasty. Can continue home Plavix. LSO brace ordered OOB. Fall precautions ordered. Start pain control with Tylenol scheduled. The patient requests no opioid pain medications as it makes her get too sedated. Has an allergy to NSAIDs and gabapentin. Consider muscle relaxers for further pain control.  Coronary artery disease: Continue home Plavix and Lipitor  Hyperlipidemia: Continue home Lipitor  Hypertension: Continue home Cozaar, Toprol-XL, and nifedipine  GERD: Continue home Protonix  Hypothyroidism: Continue home Synthroid   Other information:    Level of Care: med/surg DVT prophylaxis: Lovenox subQ  Code Status: DNR Consults: NSGY Admission status:  observation   Leslee Home DO [facility]   How to contact the [facility] Attending or Consulting provider [place] or covering provider during after hours [place], for this patient?  Check the care team in [facility] and look for a) attending/consulting [facility] provider listed and b) the [facility] team listed Log into [email or web address] and use [place]'s universal password to access. If you do not have the password, please contact the hospital operator. Locate the [facility] provider you are looking for under [facility] and page to a number that you can be directly reached. If you still have difficulty reaching the provider, please page the [facility] (Director on Call) for the Hospitalists listed on amion for assistance.   03/22/2022, 6:21 PM

## 2022-03-22 NOTE — ED Provider Notes (Addendum)
[facility] Provider Note    Event Date/Time   First MD Initiated Contact with Patient 03/22/22 1101     (approximate)   History   Back Pain   HPI  Rachael Humphrey is a 88 y.o. female with a history of extensive back fractures and kyphoplasty procedures on review of medical chart presents to the ER for evaluation of ground-level fall roughly 1 week ago.  Has history of osteoporosis.  Having worsening pain also having constipation.  No numbness or tingling.  Did not hit her head.  No chest pain or shortness of breath.     Physical Exam   Triage Vital Signs: ED Triage Vitals  Enc Vitals Group     BP 03/22/22 1050 (!) 162/86     Pulse Rate 03/22/22 1050 75     Resp 03/22/22 1050 18     Temp 03/22/22 1050 97.7 F (36.5 C)     Temp Source 03/22/22 1050 Oral     SpO2 03/22/22 1050 97 %     Weight 03/22/22 1052 120 lb (54.4 kg)     Height 03/22/22 1052 '5\' 1"'$  (1.549 m)     Head Circumference --      Peak Flow --      Pain Score 03/22/22 1056 10     Pain Loc --      Pain Edu? --      Excl. in [place]? --     Most recent vital signs: Vitals:   03/22/22 1349 03/22/22 1600  BP: (!) 182/105 (!) 141/90  Pulse: 72 75  Resp: 18 16  Temp: 97.8 F (36.6 C) (!) 97.5 F (36.4 C)  SpO2: 92% 91%     Constitutional: Alert  Eyes: Conjunctivae are normal.  Head: Atraumatic. Nose: No congestion/rhinnorhea. Mouth/Throat: Mucous membranes are moist.   Neck: Painless ROM.  Cardiovascular:   Good peripheral circulation. Respiratory: Normal respiratory effort.  No retractions.  Gastrointestinal: Soft and nontender.  Musculoskeletal:  no deformity Neurologic:  MAE spontaneously. No gross focal neurologic deficits are appreciated.  Skin:  Skin is warm, dry and intact. No rash noted. Psychiatric: Mood and affect are normal. Speech and behavior are normal.    ED Results / Procedures / Treatments   Labs (all labs ordered are listed, but only abnormal results  are displayed) Labs Reviewed  COMPREHENSIVE METABOLIC PANEL - Abnormal; Notable for the following components:      Result Value   CO2 21 (*)    Glucose, Bld 136 (*)    All other components within normal limits  URINALYSIS, ROUTINE W REFLEX MICROSCOPIC - Abnormal; Notable for the following components:   Color, Urine STRAW (*)    APPearance CLEAR (*)    All other components within normal limits  CBC WITH DIFFERENTIAL/PLATELET     EKG     RADIOLOGY Please see ED Course for my review and interpretation.  I personally reviewed all radiographic images ordered to evaluate for the above acute complaints and reviewed radiology reports and findings.  These findings were personally discussed with the patient.  Please see medical record for radiology report.    PROCEDURES:  Critical Care performed: No  Procedures   MEDICATIONS ORDERED IN ED: Medications  iohexol (OMNIPAQUE) 300 MG/ML solution 100 mL (100 mLs Intravenous Contrast Given 03/22/22 1323)     IMPRESSION / MDM / ASSESSMENT AND PLAN / ED COURSE  I reviewed the triage vital signs and the nursing notes.  Differential diagnosis includes, but is not limited to, fracture, contusion, dislocation, anemia, electrolyrte abn, ce, ss  Patient presented to the ER for evaluation of symptoms as described above.  This presenting complaint could reflect a potentially life-threatening illness therefore the patient will be placed on continuous pulse oximetry and telemetry for monitoring.  Laboratory evaluation will be sent to evaluate for the above complaints.      Clinical Course as of 03/22/22 1814  Sat Mar 22, 2022  1130 X-ray lumbar spine on my review and interpretation shows evidence of recent kyphoplasty procedure.  Will await formal radiology report. [PR]  1791 CT imaging on my review and interpretation without evidence of obstruction will await formal radiology report. [PR]  5056 Patient's postvoid  residual was 500 by bladder scan.  Given recent low back injury will order MRI to make sure she is not having cord compression symptoms she does not have any urge to urinate at this time.  She is unable to ambulate she is declining any pain medication at this time as it makes her drowsy and loopy and she is comfortable at rest but unable to get out of bed.  She will require hospitalization. [PR]  9794 MRI with evidence of compression fracture but no evidence of cauda equina or acute spinal stenosis is significant chronic findings.  As she is unable to ambulate due to pain will discuss case in consultation with hospitalist for admission for PT OT and probable placement. [PR]    Clinical Course User Index [PR] Merlyn Lot, MD      FINAL CLINICAL IMPRESSION(S) / ED DIAGNOSES   Final diagnoses:  Compression fracture of L4 vertebra, initial encounter ([place])  Acute midline low back pain without sciatica  Unable to ambulate     Rx / DC Orders   ED Discharge Orders     None        Note:  This document was prepared using Dragon voice recognition software and may include unintentional dictation errors.    Merlyn Lot, MD 03/22/22 Donna Bernard, MD 03/22/22 [phone]

## 2022-03-22 NOTE — ED Notes (Signed)
See triage note. Pt to xray and back.

## 2022-03-23 DIAGNOSIS — K219 Gastro-esophageal reflux disease without esophagitis: Secondary | ICD-10-CM | POA: Diagnosis not present

## 2022-03-23 DIAGNOSIS — I10 Essential (primary) hypertension: Secondary | ICD-10-CM

## 2022-03-23 DIAGNOSIS — E039 Hypothyroidism, unspecified: Secondary | ICD-10-CM | POA: Diagnosis not present

## 2022-03-23 DIAGNOSIS — W19XXXA Unspecified fall, initial encounter: Secondary | ICD-10-CM

## 2022-03-23 DIAGNOSIS — I2510 Atherosclerotic heart disease of native coronary artery without angina pectoris: Secondary | ICD-10-CM

## 2022-03-23 DIAGNOSIS — S32020A Wedge compression fracture of second lumbar vertebra, initial encounter for closed fracture: Secondary | ICD-10-CM | POA: Diagnosis not present

## 2022-03-23 MED ORDER — MAGNESIUM OXIDE -MG SUPPLEMENT 400 (240 MG) MG PO TABS
400.0000 mg | ORAL_TABLET | Freq: Every day | ORAL | Status: DC
Start: 2022-03-23 — End: 2022-03-25
  Administered 2022-03-23 – 2022-03-25 (×3): 400 mg via ORAL
  Filled 2022-03-23 (×3): qty 1

## 2022-03-23 MED ORDER — LIDOCAINE 5 % EX PTCH
1.0000 | MEDICATED_PATCH | CUTANEOUS | Status: DC
  Administered 2022-03-23 – 2022-03-25 (×3): 1 via TRANSDERMAL
  Filled 2022-03-23 (×3): qty 1

## 2022-03-23 MED ORDER — METOPROLOL SUCCINATE ER 25 MG PO TB24
25.0000 mg | ORAL_TABLET | Freq: Every morning | ORAL | Status: DC
  Administered 2022-03-24 – 2022-03-25 (×2): 25 mg via ORAL
  Filled 2022-03-23 (×2): qty 1

## 2022-03-23 NOTE — Progress Notes (Signed)
SBAR handoff to IP RN completed. Pt transported via bed to rm 158.  Earleen Reaper, RN

## 2022-03-23 NOTE — Consult Note (Signed)
Referring Physician:  No referring provider defined for this encounter.  Primary Physician:  Idelle Crouch, MD  History of Present Illness: 03/23/2022 Ms. Leyanna Bittman is here today with a chief complaint of fall. She had a ground level fall a week ago.  She also was changing sheets a couple of nights ago and felt some discomfort.  She has no new neurologic symptoms.  LSO was placed last night, and her pain was improved after placement.   Review of Systems:  A 10 point review of systems is negative, except for the pertinent positives and negatives detailed in the HPI.  Past Medical History: Past Medical History:  Diagnosis Date   Adenocarcinoma, colon ([place]) 2005   Biatrial enlargement    Chronic anticoagulation    Clopidogrel   Complication of anesthesia    Coronary artery disease    a.) 2.75 x 28 mm Vision BMS to pLAD on 02/23/2014   GERD (gastroesophageal reflux disease)    Grade II diastolic dysfunction    H/O hiatal hernia    Hemorrhoids    internal-not a problem now.   History of kidney stones    multiple   History of methicillin resistant staphylococcus aureus (MRSA) 2020   History of shingles 04/18/2014   HLD (hyperlipidemia)    Hypertension    Hypothyroidism    Myocardial infarction (HCC)    PONV (postoperative nausea and vomiting)    Senile osteoporosis    T2DM (type 2 diabetes mellitus) (HCC)    TIA (transient ischemic attack)    Valvular regurgitation    panvalvular    Past Surgical History: Past Surgical History:  Procedure Laterality Date   APPENDECTOMY     AUGMENTATION MAMMAPLASTY Bilateral 1978   BREAST BIOPSY Right    over 30 years ago negative   BREAST BIOPSY Left    negative over 30 years ago   CATARACT EXTRACTION, BILATERAL     COLECTOMY     partial small and large instestine removed   CORONARY ANGIOPLASTY WITH STENT PLACEMENT N/A 02/23/2014   2.75 x 28 mm Vision BMS x 1 to pLAD   CYSTOSCOPY WITH RETROGRADE PYELOGRAM, URETEROSCOPY  AND STENT PLACEMENT Bilateral 05/17/2014   Procedure: CYSTOSCOPY WITH RETROGRADE PYELOGRAM, URETEROSCOPY AND STENT PLACEMENT, BASKET STONE REMOVAL;  Surgeon: Alexis Frock, MD;  Location: [facility];  Service: Urology;  Laterality: Bilateral;   KYPHOPLASTY N/A 11/25/2018   Procedure: KYPHOPLASTY T11, DIABETIC;  Surgeon: Hessie Knows, MD;  Location: [facility];  Service: Orthopedics;  Laterality: N/A;   KYPHOPLASTY N/A 11/28/2020   Procedure: [id number]  USE VASCULAR BED;  Surgeon: Hessie Knows, MD;  Location: [facility];  Service: Orthopedics;  Laterality: N/A;   KYPHOPLASTY N/A 04/25/2021   Procedure: L 3 KYPHOPLASTY;  Surgeon: Hessie Knows, MD;  Location: [facility];  Service: Orthopedics;  Laterality: N/A;    Allergies: Allergies as of 03/22/2022 - Review Complete 03/22/2022  Allergen Reaction Noted   Codeine Nausea And Vomiting 05/03/2014   Darvon [propoxyphene] Other (See Comments) 02/01/2014   Demerol [meperidine] Other (See Comments) 02/01/2014   Gabapentin Other (See Comments) 02/01/2014   Sulfa antibiotics Other (See Comments) 02/01/2014   Tramadol Other (See Comments) 02/01/2014   Voltaren [diclofenac sodium] Other (See Comments) 02/01/2014   Zinc  05/03/2014    Medications: Current Meds  Medication Sig   atorvastatin (LIPITOR) 20 MG tablet Take 20 mg by mouth daily. Reported on 09/18/2015   Calcium Carb-Cholecalciferol (CALCIUM 600 + D PO) Take 1 tablet by mouth  daily.   cetirizine (ZYRTEC) 10 MG tablet Take 10 mg by mouth daily.   clopidogrel (PLAVIX) 75 MG tablet Take 75 mg by mouth daily.    fluticasone (FLONASE) 50 MCG/ACT nasal spray Place 2 sprays into both nostrils daily as needed for allergies.    levothyroxine (SYNTHROID, LEVOTHROID) 25 MCG tablet Take 25 mcg by mouth daily before breakfast.    losartan (COZAAR) 25 MG tablet Take 25 mg by mouth daily.   metoprolol succinate (TOPROL-XL) 50 MG 24 hr tablet Take 75 mg by mouth every morning.    montelukast (SINGULAIR) 10  MG tablet Take 10 mg by mouth at bedtime.    Multiple Vitamin (MULTIVITAMIN WITH MINERALS) TABS tablet Take 1 tablet by mouth daily.   Multiple Vitamins-Minerals (PRESERVISION AREDS 2 PO) Take 1 tablet by mouth 2 (two) times daily.    NIFEdipine (PROCARDIA-XL/NIFEDICAL-XL) 30 MG 24 hr tablet Take 30 mg by mouth daily.   pantoprazole (PROTONIX) 40 MG tablet Take 40 mg by mouth daily.     Social History: Social History   Tobacco Use   Smoking status: Never   Smokeless tobacco: Never  Vaping Use   Vaping Use: Never used  Substance Use Topics   Alcohol use: No   Drug use: No    Family Medical History: Family History  Problem Relation Age of Onset   Stroke Mother    Breast cancer Sister 49    Physical Examination: Vitals:   03/23/22 0430 03/23/22 0535  BP: (!) 149/84 (!) 148/77  Pulse: 68 67  Resp:  19  Temp:  97.8 F (36.6 C)  SpO2: 94% 96%    General: Patient is well developed, well nourished, calm, collected, and in no apparent distress. Attention to examination is appropriate.  Psychiatric: Patient is non-anxious.  Head:  Pupils equal, round, and reactive to light.  ENT:  Oral mucosa appears well hydrated.  Neck:   Supple.  Full range of motion.  Respiratory: Patient is breathing without any difficulty.  Extremities: No edema.  Vascular: Palpable dorsal pedal pulses.  Skin:   On exposed skin, there are no abnormal skin lesions.  NEUROLOGICAL:     Awake, alert, oriented to person, place, and time.  Speech is clear and fluent. Fund of knowledge is appropriate.   Cranial Nerves: Pupils equal round and reactive to light.  Facial tone is symmetric.  Facial sensation is symmetric. Shoulder shrug is symmetric. Tongue protrusion is midline.  There is no pronator drift.   Strength: Side Biceps Triceps Deltoid Interossei Grip Wrist Ext. Wrist Flex.  R '5 5 5 5 5 5 5  '$ L '5 5 5 5 5 5 5   '$ Side Iliopsoas Quads Hamstring PF DF EHL  R '5 5 5 5 5 5  '$ L '5 5 5 5 5 5    '$ Reflexes are 2+ and symmetric at the biceps, triceps, brachioradialis, patella and achilles.   Hoffman's is absent.  Clonus is not present.  Toes are down-going.  Bilateral upper and lower extremity sensation is intact to light touch.    No evidence of dysmetria noted.  Gait is untested.    Medical Decision Making  Imaging: MRI L spine 03/22/22 IMPRESSION: 1. Acute L2 superior endplate compression fracture with 15% height loss. No retropulsion. 2. Chronic T11, T12, L3, and L4 compression deformities status post cement augmentation. 3. Unchanged multilevel lumbar spondylosis as described above, worst at L4-L5 where there is moderate to severe spinal canal stenosis and severe right neuroforaminal stenosis.  Electronically Signed   By: Titus Dubin M.D.   On: 03/22/2022 17:51  I have personally reviewed the images and agree with the above interpretation.  Assessment and Plan: Ms. Amara is a pleasant 88 y.o. female with L2 compression fracture.  She is improved with bracing.  - No surgery or kyphoplasty recommended - Wear brace when OOB - Outpatient follow up arranged  I spent a total of 30 minutes in face-to-face and non-face-to-face activities related to this patient's care today.  Thank you for involving me in the care of this patient.   This note was partially dictated using voice recognition software, so please excuse any errors that were not corrected.    K. Izora Ribas MD, [facility] Neurosurgery

## 2022-03-23 NOTE — Progress Notes (Addendum)
PROGRESS NOTE    Rachael Humphrey  BPZ:[id number] DOB: [birth year] DOA: 03/22/2022 PCP: Idelle Crouch, MD    Brief Narrative:  Rachael Humphrey is a 88 y.o. female with a past medical history of osteoporosis, coronary artery disease s/p PCI, history of colon cancer, hypertension, hyperlipidemia, hypothyroidism, history of kyphoplasty.   This patient presents to the emergency department due to back pain worsened after ground-level mechanical fall about a week ago.  She was in the kitchen getting ready to go to church when her feet got tangled up and she fell.  Since then her pain has worsened and she has been unable to ambulate due to her pain.  Normally pretty high functioning at home.  Occasionally uses a cane to assist with ambulation.  She lives alone at home but family checks on her daily.  Requests no opioid pain medications due to getting very sedated.  Denies any saddle anesthesia.  No urinary incontinence.  No stool incontinence.  No fevers or chills. Previous kyphoplasty by Dr. Rudene Christians.    7/9 no overnight issues  Consultants:  Neurosurgery   Procedures:   Antimicrobials:      Subjective: Has brace on. Some pain. No numbness, no sob or cp  Objective: Vitals:   03/23/22 0535 03/23/22 0723 03/23/22 1030 03/23/22 1137  BP: (!) 148/77 121/69 101/69 110/76  Pulse: 67 64    Resp: 19 17    Temp: 97.8 F (36.6 C) 98.1 F (36.7 C)    TempSrc: Oral Oral    SpO2: 96% 97%    Weight:      Height:        Intake/Output Summary (Last 24 hours) at 03/23/2022 1504 Last data filed at 03/23/2022 1352 Gross per 24 hour  Intake 200 ml  Output 650 ml  Net -450 ml   Filed Weights   03/22/22 1052  Weight: 54.4 kg    Examination: Calm, NAD Cta no w/r Reg s1/s2 no gallop Soft benign +bs No edema Grossly intact, awake and alert Mood and affect appropriate in current setting     Data Reviewed: I have personally reviewed following labs and imaging studies  CBC: Recent Labs  Lab  03/22/22 1200  WBC 6.3  NEUTROABS 3.8  HGB 13.7  HCT 40.1  MCV 93.3  PLT 242   Basic Metabolic Panel: Recent Labs  Lab 03/22/22 1200  NA 137  K 3.9  CL 105  CO2 21*  GLUCOSE 136*  BUN 16  CREATININE 0.65  CALCIUM 9.7   GFR: Estimated Creatinine Clearance: 35.3 mL/min (by C-G formula based on SCr of 0.65 mg/dL). Liver Function Tests: Recent Labs  Lab 03/22/22 1200  AST 22  ALT 17  ALKPHOS 73  BILITOT 0.8  PROT 7.0  ALBUMIN 4.1   No results for input(s): "LIPASE", "AMYLASE" in the last 168 hours. No results for input(s): "AMMONIA" in the last 168 hours. Coagulation Profile: No results for input(s): "INR", "PROTIME" in the last 168 hours. Cardiac Enzymes: No results for input(s): "CKTOTAL", "CKMB", "CKMBINDEX", "TROPONINI" in the last 168 hours. BNP (last 3 results) No results for input(s): "PROBNP" in the last 8760 hours. HbA1C: No results for input(s): "HGBA1C" in the last 72 hours. CBG: No results for input(s): "GLUCAP" in the last 168 hours. Lipid Profile: No results for input(s): "CHOL", "HDL", "LDLCALC", "TRIG", "CHOLHDL", "LDLDIRECT" in the last 72 hours. Thyroid Function Tests: No results for input(s): "TSH", "T4TOTAL", "FREET4", "T3FREE", "THYROIDAB" in the last 72 hours. Anemia Panel: No  results for input(s): "VITAMINB12", "FOLATE", "FERRITIN", "TIBC", "IRON", "RETICCTPCT" in the last 72 hours. Sepsis Labs: No results for input(s): "PROCALCITON", "LATICACIDVEN" in the last 168 hours.  No results found for this or any previous visit (from the past 240 hour(s)).       Radiology Studies: MR LUMBAR SPINE WO CONTRAST  Result Date: 03/22/2022 CLINICAL DATA:  Chronic low back pain.  [place] a week ago. EXAM: MRI LUMBAR SPINE WITHOUT CONTRAST TECHNIQUE: Multiplanar, multisequence MR imaging of the lumbar spine was performed. No intravenous contrast was administered. COMPARISON:  CT lumbar spine from same day. MRI lumbar spine dated April 16, 2021. FINDINGS:  Segmentation: Transitional lumbosacral anatomy again noted with lumbarization of S1. Alignment:  Unchanged trace retrolisthesis at L1-L2. Vertebrae: Acute appearing L2 superior endplate compression fracture with 15% height loss and mild marrow edema. No retropulsion. Chronic T11, T12, L3, and L4 compression deformities status post cement augmentation. Conus medullaris and cauda equina: Conus extends to the L1-L2 level. Conus and cauda equina appear normal. Paraspinal and other soft tissues: Negative. Disc levels: T12-L1: Unchanged mild disc bulging with left paracentral endplate spurring. No stenosis. L1-L2: Unchanged mild disc bulging and bilateral facet arthropathy. Unchanged mild right neuroforaminal stenosis. No spinal canal or left neuroforaminal stenosis. L2-L3: Unchanged mild foraminal disc bulging and mild bilateral facet arthropathy. No stenosis. L3-L4: Unchanged mild disc bulging with moderate right and mild left facet arthropathy. Unchanged mild bilateral lateral recess stenosis. No spinal canal or neuroforaminal stenosis. L4-L5: Unchanged small broad-based posterior disc protrusion and severe bilateral facet arthropathy with resultant moderate to severe spinal canal stenosis and severe right neuroforaminal stenosis. No left neuroforaminal stenosis. L5-S1: Transitional level. Unchanged mild left neuroforaminal stenosis due to foraminal endplate spurring and left facet arthropathy. No spinal canal or right neuroforaminal stenosis. IMPRESSION: 1. Acute L2 superior endplate compression fracture with 15% height loss. No retropulsion. 2. Chronic T11, T12, L3, and L4 compression deformities status post cement augmentation. 3. Unchanged multilevel lumbar spondylosis as described above, worst at L4-L5 where there is moderate to severe spinal canal stenosis and severe right neuroforaminal stenosis. Electronically Signed   By: Titus Dubin M.D.   On: 03/22/2022 17:51   CT L-SPINE NO CHARGE  Result Date:  03/22/2022 CLINICAL DATA:  Mechanical fall EXAM: CT LUMBAR SPINE WITHOUT CONTRAST TECHNIQUE: Multidetector CT imaging of the lumbar spine was performed without intravenous contrast administration. Multiplanar CT image reconstructions were also generated. RADIATION DOSE REDUCTION: This exam was performed according to the departmental dose-optimization program which includes automated exposure control, adjustment of the mA and/or kV according to patient size and/or use of iterative reconstruction technique. COMPARISON:  MRI lumbar spine 04/16/2021, radiograph 04/25/2021. FINDINGS: Segmentation: There is a transitional lumbosacral vertebrae with lumbarized S1 and rudimentary disc at S1-S2. Numbering is consistent with prior MRI 04/16/2021. Independent review of these levels should be performed prior to any planned intervention. Alignment: Trace retrolisthesis at L1-L2, unchanged. Vertebrae: Osteopenia. There are chronic compression deformities with prior vertebroplasties involving T11, T12, L3, and L4. There is no evidence of progressive height loss. There is a new compression fracture of L2 with approximately 20% height loss. There is a fracture line disrupting the superior and inferior endplates. Paraspinal and other soft tissues: Reported separately on CT abdomen and pelvis. Disc levels: There is multilevel degenerative disc disease and facet arthropathy, similar resulting in varying degrees of spinal canal neural foraminal stenosis. This is most prominent at L4-L5 where there is severe spinal canal stenosis and severe right neural foraminal stenosis,  as demonstrated on prior lumbar spine MRI, not significantly changed. IMPRESSION: New compression fracture of L2 with approximately 20% height loss. Multilevel degenerative disc disease and facet arthropathy, most pronounced at L4-L5 with severe spinal canal stenosis and right neural foraminal stenosis, similar to prior MRI. Unchanged chronic compression deformities of  T11, T12, L3, and L4 with prior vertebroplasties. Electronically Signed   By: Maurine Simmering M.D.   On: 03/22/2022 15:13   CT ABDOMEN PELVIS W CONTRAST  Result Date: 03/22/2022 CLINICAL DATA:  Abdominal pain, acute, nonlocalized EXAM: CT ABDOMEN AND PELVIS WITH CONTRAST TECHNIQUE: Multidetector CT imaging of the abdomen and pelvis was performed using the standard protocol following bolus administration of intravenous contrast. RADIATION DOSE REDUCTION: This exam was performed according to the departmental dose-optimization program which includes automated exposure control, adjustment of the mA and/or kV according to patient size and/or use of iterative reconstruction technique. CONTRAST:  151m OMNIPAQUE IOHEXOL 300 MG/ML  SOLN COMPARISON:  CT pelvis 11/19/2020, CT abdomen pelvis 04/01/2014 lumbar spine 04/16/2021 FINDINGS: Lower chest: Bibasilar scarring and atelectasis. Cardiomegaly. Mild peripheral fibrotic change. Bilateral breast implants with calcified capsules. Hepatobiliary: No focal liver abnormality is seen. The gallbladder is unremarkable. Pancreas: Unremarkable. No pancreatic ductal dilatation or surrounding inflammatory changes. Spleen: Normal in size without focal abnormality. Adrenals/Urinary Tract: Stable 1.8 cm left adrenal nodule since July 2015, likely a benign adenoma. The right adrenal gland is normal. There is prominence of the renal, right collecting systems bilaterally greater than left, with decompressed ureters and no obstructing stone identified. No perinephric or periureteral stranding. There are small bilateral renal cysts too small to characterize. There is a punctate nonobstructive stone in the right upper pole and a 3 mm stone in the right lower pole. There is a 3 mm stone in the lower pole the left kidney which is nonobstructive. The bladder is markedly distended. Stomach/Bowel: Large hiatal hernia. There is no evidence of bowel obstruction.Postsurgical changes in the region of the  cecum. The appendix is surgically absent. No pericecal inflammation. Extensive sigmoid diverticulosis. No diverticulitis. Moderate stool burden. Vascular/Lymphatic: Aortoiliac atherosclerosis. No AAA. No lymphadenopathy. Reproductive: There is a pessary in place.  No adnexal mass. Other: Fat containing ventral lower abdominal wall hernia with a 2.0 cm aperture, and stranding within the herniated fat. There is a left periumbilical fat containing hernia with a 1.8 cm aperture. No bowel containing hernia. No ascites. No free air. Musculoskeletal: There is a transitional lumbosacral vertebrae present, likely lumbarized S1 with rudimentary disc at S1-S2. Numbering is consistent with prior lumbar spine MRI 04/16/2021. There are multiple chronic compression deformities with vertebroplasties, including T11, T12, L3, and L4. There is a new compression fracture of L2 with approximately 20% height loss. There is a fracture line disrupting the superior and inferior endplate cortex (series 513, image 58). There is severe inter spinous degenerative change and multilevel facet arthropathy. There is multilevel degenerative disc disease with most severe disc height loss at L5-S1. No suspicious osseous lesion. Diffuse osteopenia. IMPRESSION: Prominence of the renal collecting systems bilaterally, likely due to a markedly distended urinary bladder. Bilateral nonobstructive nephrolithiasis without obstructing ureter stone identified. Fat containing ventral lower abdominal wall hernia with some stranding within the herniated fat, which can be seen with fat ischemia. Correlate with point tenderness and reducibility. No bowel containing hernia. New compression fracture of L2 with approximately 20% height loss. Postsurgical changes in the right lower quadrant. No bowel obstruction. Sigmoid diverticulosis without evidence of acute diverticulitis. Large hiatal hernia. Cardiomegaly.  Mild peripheral fibrotic change in the lung bases.  Electronically Signed   By: Maurine Simmering M.D.   On: 03/22/2022 15:08   DG Lumbar Spine Complete  Result Date: 03/22/2022 CLINICAL DATA:  Fall. EXAM: LUMBAR SPINE - COMPLETE 4+ VIEW COMPARISON:  04/25/2021 FINDINGS: Bones are markedly demineralized. Status post vertebral augmentation at T11, T12, L3, and L4. No evidence for acute fracture involving the on augmented levels. SI joints unremarkable. Loss of disc height noted T12-L1 and L5-S1. IMPRESSION: 1. Marked osteopenia with multiple vertebral augmentation. No acute fracture evident. 2. Degenerative disc disease at T12-L1 and L5-S1. Electronically Signed   By: Misty Stanley M.D.   On: 03/22/2022 11:51        Scheduled Meds:  acetaminophen  1,000 mg Oral Q8H   atorvastatin  20 mg Oral Daily   calcium-vitamin D  1 tablet Oral Daily   clopidogrel  75 mg Oral Daily   enoxaparin (LOVENOX) injection  40 mg Subcutaneous Q24H   levothyroxine  25 mcg Oral QAC breakfast   lidocaine  1 patch Transdermal Q24H   loratadine  10 mg Oral Daily   magnesium oxide  400 mg Oral Daily   [START ON 03/24/2022] metoprolol succinate  25 mg Oral q morning   montelukast  10 mg Oral QHS   multivitamin with minerals  1 tablet Oral Daily   multivitamin-lutein  1 capsule Oral BID   pantoprazole  40 mg Oral Daily   senna  1 tablet Oral BID   Continuous Infusions:  Assessment & Plan:   Principal Problem:   Compression fracture of L2 ([place]) Active Problems:   Acquired hypothyroidism   Coronary artery disease   Benign essential hypertension   GERD (gastroesophageal reflux disease)   Hyperlipidemia, unspecified  Acute L2 compression fracture:  Found on MRI Neurosurgery consulted- no kyphoplasty  LSO brace ordered  Continue Plavix   pain control, lidocaine patch will be ordered  PT consult   Coronary artery disease:  Continue Plavix and statin   Hyperlipidemia:  Continue statin   Hypertension:  Bp on lower side Hold Cozaar, nifedipine Decrease  Toprol-XL to 25 mg starting tomorrow      GERD:  Continue Protonix      Hypothyroidism:  Continue Synthroid      DVT prophylaxis: Lovenox Code Status: DNR Family Communication: Family at bedside Disposition Plan: Back home Status is: Observation The patient remains OBS appropriate and will d/c before 2 midnights.        LOS: 0 days   Time spent: 35 minutes    Nolberto Hanlon, MD [facility] Pager [phone]  If 7PM-7AM, please contact night-coverage 03/23/2022, 3:04 PM

## 2022-03-24 DIAGNOSIS — E785 Hyperlipidemia, unspecified: Secondary | ICD-10-CM

## 2022-03-24 DIAGNOSIS — S32020A Wedge compression fracture of second lumbar vertebra, initial encounter for closed fracture: Secondary | ICD-10-CM | POA: Diagnosis not present

## 2022-03-24 DIAGNOSIS — E039 Hypothyroidism, unspecified: Secondary | ICD-10-CM | POA: Diagnosis not present

## 2022-03-24 DIAGNOSIS — I10 Essential (primary) hypertension: Secondary | ICD-10-CM | POA: Diagnosis not present

## 2022-03-24 DIAGNOSIS — I2510 Atherosclerotic heart disease of native coronary artery without angina pectoris: Secondary | ICD-10-CM | POA: Diagnosis not present

## 2022-03-24 LAB — BASIC METABOLIC PANEL
Anion gap: 8 (ref 5–15)
BUN: 17 mg/dL (ref 8–23)
CO2: 22 mmol/L (ref 22–32)
Calcium: 9.7 mg/dL (ref 8.9–10.3)
Chloride: 108 mmol/L (ref 98–111)
Creatinine, Ser: 0.76 mg/dL (ref 0.44–1.00)
GFR, Estimated: >60 mL/min (ref >60)
Glucose, Bld: 143 mg/dL — ABNORMAL HIGH (ref 70–99)
Potassium: 4.7 mmol/L (ref 3.5–5.1)
Sodium: 138 mmol/L (ref 135–145)

## 2022-03-24 MED ORDER — LOSARTAN POTASSIUM 25 MG PO TABS
25.0000 mg | ORAL_TABLET | Freq: Every day | ORAL | Status: DC
  Administered 2022-03-24 – 2022-03-25 (×2): 25 mg via ORAL
  Filled 2022-03-24 (×2): qty 1

## 2022-03-24 NOTE — Progress Notes (Cosign Needed Addendum)
    Durable Medical Equipment  (From admission, onward)           Start     Ordered   03/24/22 1511  For home use only DME Hospital bed  Once       Question Answer Comment  Length of Need Lifetime   Bed type Semi-electric   Hoyer Lift Yes   Trapeze Bar Yes   Support Surface: Gel Overlay      03/24/22 1510

## 2022-03-24 NOTE — TOC Initial Note (Signed)
Transition of Care [facility]) - Initial/Assessment Note    Patient Details  Name: Rachael Humphrey MRN: [id number] Date of Birth: April 22, 1930  Transition of [facility]) CM/SW Contact:    Pete Pelt, RN Phone Number: 03/24/2022, 3:50 PM  Clinical Narrative:  Patient lives at home, states she has support from friends and family at home, and feels safe returning home after discharge.  Patient has no concerns about obtaining medications and uses a pill box to assist with medication compliance.  Her sister assists her with transportation to appointments, and patient reports she is current with all appointments at this time.  Patient has a rolling walker and bedside commode at home.  She spoke with PT, who supports patient getting a hospital bed at home.  Andree Coss from [facility] notified of bed and potential discharge tomorrow.  Patient does not currently have home health but is receptive to the assistance at home.  Floydene Flock from [facility] notified, will accept patient.  Patient states she has no further concerns at this time.  TOC contact information provided.                 Expected Discharge Plan: [place] Barriers to Discharge: Continued Medical Work up   Patient Goals and CMS Choice        Expected Discharge Plan and Services Expected Discharge Plan: [place]   Discharge Planning Services: CM Consult Post Acute Care Choice: Home Health, Durable Medical Equipment Living arrangements for the past 2 months: Single Family Home                 DME Arranged: Hospital bed DME Agency: [facility] Date DME Agency Contacted: 03/24/22 Time DME Agency Contacted: [phone] Representative spoke with at DME Agency: [place]: PT, [place] Agency: [place] ([place]) Date [place]: 03/24/22 Time [place]: [place] Representative spoke with at [place]: Floydene Flock  Prior Living  Arrangements/Services Living arrangements for the past 2 months: [place] with:: Self Patient language and need for interpreter reviewed:: Yes Do you feel safe going back to the place where you live?: Yes      Need for Family Participation in Patient Care: Yes (Comment) Care giver support system in place?: Yes (comment) Current home services: DME (patient has rolling walker and bedside commode) Criminal Activity/Legal Involvement Pertinent to Current Situation/Hospitalization: No - Comment as needed  Activities of Daily Living Home Assistive Devices/Equipment: Cane (specify quad or straight), Eyeglasses ADL Screening (condition at time of admission) Patient's cognitive ability adequate to safely complete daily activities?: Yes Is the patient deaf or have difficulty hearing?: No Does the patient have difficulty seeing, even when wearing glasses/contacts?: No Does the patient have difficulty concentrating, remembering, or making decisions?: No Patient able to express need for assistance with ADLs?: Yes Does the patient have difficulty dressing or bathing?: No Independently performs ADLs?: Yes (appropriate for developmental age) Does the patient have difficulty walking or climbing stairs?: Yes Weakness of Legs: Both Weakness of Arms/Hands: None  Permission Sought/Granted Permission sought to share information with : Case Manager Permission granted to share information with : Yes, Verbal Permission Granted     Permission granted to share info w AGENCY: [facility], [facility]        Emotional Assessment Appearance:: Appears stated age Attitude/Demeanor/Rapport: Gracious, Engaged Affect (typically observed): Pleasant, Appropriate Orientation: : Oriented to Self, Oriented to Place, Oriented  to  Time, Oriented to Situation Alcohol / Substance Use: Not Applicable Psych Involvement: No (comment)  Admission diagnosis:  Compression fracture of L2 ([place])  [S32.020A] Unable to ambulate [R26.2] Acute midline low back pain without sciatica [M54.50] Compression fracture of L4 vertebra, initial encounter ([place]) [S32.040A] Patient Active Problem List   Diagnosis Date Noted   Compression fracture of L2 ([place]) 03/22/2022   Back pain 11/26/2020   Closed compression fracture of L4 lumbar vertebra, initial encounter ([place]) 11/26/2020   Closed patellar sleeve fracture of right knee 04/08/2019   Fall 02/12/2017   Acquired hypothyroidism 10/06/2016   Arm fracture 10/06/2016   Benign neoplasm of colon 10/06/2016   Cervical adenopathy 10/06/2016   Benign essential hypertension 10/06/2016   GERD (gastroesophageal reflux disease) 10/06/2016   Hiatal hernia 10/06/2016   History of abnormal cervical Pap smear 10/06/2016   History of TIA (transient ischemic attack) 10/06/2016   Hyperlipidemia, unspecified 10/06/2016   Nephrolithiasis 10/06/2016   Senile osteoporosis 10/06/2016   Type 2 diabetes mellitus without complication ([place]) 90/93/1121   Rupture of implant of right breast 09/18/2015   Chronic back pain 03/08/2014   Compression fracture 03/08/2014   Coronary artery disease 03/03/2014   Wedge compression fracture of T7 vertebra ([place]) 02/22/2014   PCP:  Idelle Crouch, MD Pharmacy:   [id number] [place], [place] [id number]: [phone]: [phone]    Social Determinants of Health (SDOH) Interventions    Readmission Risk Interventions     No data to display

## 2022-03-24 NOTE — Plan of Care (Signed)

## 2022-03-24 NOTE — Evaluation (Signed)
Physical Therapy Evaluation Patient Details Name: Rachael Humphrey MRN: [id number] DOB: [birth year] Today's Date: 03/24/2022  History of Present Illness  Patient is a 88 year old female with a past medical history of osteoporosis, coronary artery disease s/p PCI, history of colon cancer, hypertension, hyperlipidemia, hypothyroidism, history of kyphoplasty who presents to the ED with back pain worsened after ground-level mechanical fall about a week ago. Found to have L2 compression fracture with no surgery recommended. Brace has been ordered.   Clinical Impression  Patient is cooperative and agreeable to PT evaluation. She is eager to regain independence and return home. She lives alone but has a supportive family that is willing to assist as needed at discharge.  Today, the patient walked 2 laps in the hallway with rolling walker. Functional mobility training and education provided with tips for mobilizing while maintaining general spine precautions. Physical assistance with bed mobility required with bed flat and no use of bed rails. Recommend a hospital bed at home for increased independence, to decrease caregiver burden, and decreased pain with bed mobility. Recommend PT follow up to maximize independence and facilitate return to prior level of function. Anticipate patient can return home with family assistance and HHPT.      Recommendations for follow up therapy are one component of a multi-disciplinary discharge planning process, led by the attending physician.  Recommendations may be updated based on patient status, additional functional criteria and insurance authorization.  Follow Up Recommendations Home health PT      Assistance Recommended at Discharge Set up Supervision/Assistance  Patient can return home with the following  A little help with walking and/or transfers;A little help with bathing/dressing/bathroom;Help with stairs or ramp for entrance;Assist for transportation    Equipment Recommendations Hospital bed (recommended for increased independence, to decrease caregiver burden, and decreased pain with bed mobility)   Recommendations for Other Services       Functional Status Assessment Patient has had a recent decline in their functional status and demonstrates the ability to make significant improvements in function in a reasonable and predictable amount of time.     Precautions / Restrictions Precautions Precautions: Fall;Back Precaution Booklet Issued: Yes (comment) Precaution Comments: handout with general spine precuations and tips for safe mobility issued Required Braces or Orthoses: Spinal Brace Spinal Brace: Applied in supine position Restrictions Weight Bearing Restrictions: No      Mobility  Bed Mobility Overal bed mobility: Needs Assistance Bed Mobility: Sit to Supine, Supine to Sit     Supine to sit: Min assist Sit to supine: Min assist   General bed mobility comments: with the head of bed flat to simulate bed mobility at home, patient has increased pain and needs physical assistance for logroll technique. she would benefit from a hospital bed at home for increased independence, to decrease pain, and to assist with position of comfort for sleep. cues provided for technique for logrolling and handout provided. bed mobility performed x 2 bouts for practice with family participating at bedside    Transfers Overall transfer level: Needs assistance Equipment used: Rolling walker (2 wheels) Transfers: Sit to/from Stand Sit to Stand: Supervision           General transfer comment: verbal cues for safety    Ambulation/Gait Ambulation/Gait assistance: Supervision Gait Distance (Feet): 330 Feet Assistive device: Rolling walker (2 wheels) Gait Pattern/deviations: Step-through pattern Gait velocity: decreased     General Gait Details: occasional cues for upright posture and body mechanics with turns using rolling walker. no  loss  of balance and no increased pain reported with walking with decreased in pain reported.  Stairs            Wheelchair Mobility    Modified Rankin (Stroke Patients Only)       Balance Overall balance assessment: Needs assistance Sitting-balance support: Feet supported Sitting balance-Leahy Scale: Good     Standing balance support: Bilateral upper extremity supported Standing balance-Leahy Scale: Fair Standing balance comment: patient relying on rolling walker for support with dynamic activity. static standing required no external support                             Pertinent Vitals/Pain Pain Assessment Pain Assessment: 0-10 Pain Score: 5  Pain Descriptors / Indicators: Discomfort Pain Intervention(s): Limited activity within patient's tolerance, Monitored during session, Repositioned    Home Living Family/patient expects to be discharged to:: Private residence Living Arrangements: Alone Available Help at Discharge: Family;Available PRN/intermittently Type of Home: House Home Access: Stairs to enter Entrance Stairs-Rails: Left (grab bar) Entrance Stairs-Number of Steps: 1   Home Layout: One level Home Equipment: Rollator (4 wheels);Cane - single point;Shower seat      Prior Function Prior Level of Function : Independent/Modified Independent;History of Falls (last six months);Driving             Mobility Comments: using  cane most of the time ADLs Comments: independent     Hand Dominance        Extremity/Trunk Assessment   Upper Extremity Assessment Upper Extremity Assessment: Overall WFL for tasks assessed    Lower Extremity Assessment Lower Extremity Assessment: Overall WFL for tasks assessed       Communication   Communication: No difficulties  Cognition Arousal/Alertness: Awake/alert Behavior During Therapy: WFL for tasks assessed/performed Overall Cognitive Status: Within Functional Limits for tasks assessed                                  General Comments: patient is able to follow all commands without difficulty        General Comments General comments (skin integrity, edema, etc.): education provided for tips to maintain general spine precuations with ADLs and functional mobility. encouraged patient to use rolling walker for safety with ambulation for fall prevention. the patient wore her brace from home, however a new brace has been ordered per nurse.    Exercises     Assessment/Plan    PT Assessment Patient needs continued PT services  PT Problem List Decreased activity tolerance;Decreased balance;Decreased mobility;Pain;Decreased safety awareness;Decreased knowledge of precautions;Decreased knowledge of use of DME       PT Treatment Interventions DME instruction;Gait training;Stair training;Functional mobility training;Therapeutic activities;Therapeutic exercise;Balance training;Patient/family education;Neuromuscular re-education    PT Goals (Current goals can be found in the Care Plan section)  Acute Rehab PT Goals Patient Stated Goal: to be as independent as possible PT Goal Formulation: With patient/family Time For Goal Achievement: 04/07/22 Potential to Achieve Goals: Good    Frequency 7X/week     Co-evaluation               AM-PAC PT "6 Clicks" Mobility  Outcome Measure Help needed turning from your back to your side while in a flat bed without using bedrails?: A Little Help needed moving from lying on your back to sitting on the side of a flat bed without using bedrails?: A Little Help needed  moving to and from a bed to a chair (including a wheelchair)?: A Little Help needed standing up from a chair using your arms (e.g., wheelchair or bedside chair)?: A Little Help needed to walk in hospital room?: A Little Help needed climbing 3-5 steps with a railing? : A Little 6 Click Score: 18    End of Session Equipment Utilized During Treatment: Back brace Activity  Tolerance: Patient tolerated treatment well Patient left: in bed;with call bell/phone within reach;with family/visitor present;with nursing/sitter in room Nurse Communication: Mobility status (brace) PT Visit Diagnosis: Pain;Other abnormalities of gait and mobility (R26.89) Pain - Right/Left:  (center) Pain - part of body:  (lower back)    Time: 7703-4035 PT Time Calculation (min) (ACUTE ONLY): 59 min   Charges:   PT Evaluation $PT Eval Low Complexity: 1 Low PT Treatments $Gait Training: 23-37 mins $Therapeutic Activity: 8-22 mins        Minna Merritts, PT, MPT   Percell Locus 03/24/2022, 10:06 AM

## 2022-03-24 NOTE — Plan of Care (Signed)

## 2022-03-24 NOTE — Progress Notes (Signed)
Orthopedic Tech Progress Note Patient Details:  Rachael Humphrey Apr 05, 1930 [id number]  Called in order to [employer] for a LSO QUICK DRAW, brace was ordered on the 8th just now calling it in.   Patient ID: Rachael Humphrey, female   DOB: Nov 19, 1929, 88 y.o.   MRN: [id number]  [name] 03/24/2022, 1:38 PM

## 2022-03-24 NOTE — Progress Notes (Signed)
PROGRESS NOTE    COZY VEALE  YPP:[id number] DOB: 10/27/1929 DOA: 03/22/2022 PCP: Idelle Crouch, MD    Brief Narrative:  Rachael Humphrey is a 88 y.o. female with a past medical history of osteoporosis, coronary artery disease s/p PCI, history of colon cancer, hypertension, hyperlipidemia, hypothyroidism, history of kyphoplasty.   This patient presents to the emergency department due to back pain worsened after ground-level mechanical fall about a week ago.  She was in the kitchen getting ready to go to church when her feet got tangled up and she fell.  Since then her pain has worsened and she has been unable to ambulate due to her pain.  Normally pretty high functioning at home.  Occasionally uses a cane to assist with ambulation.  She lives alone at home but family checks on her daily.  Requests no opioid pain medications due to getting very sedated.  Denies any saddle anesthesia.  No urinary incontinence.  No stool incontinence.  No fevers or chills. Previous kyphoplasty by Dr. Rudene Christians.    7/10 no overnight issues. +bm. Pain this am is tolerable.awaiting for LSO brace  Consultants:  Neurosurgery   Procedures:   Antimicrobials:      Subjective: No sob or cp  Objective: Vitals:   03/23/22 1527 03/23/22 2005 03/24/22 0336 03/24/22 0916  BP: (!) 151/87 107/68 (!) 158/97 (!) 152/94  Pulse: 67 76 72 77  Resp: '15 20 19 18  '$ Temp: 97.9 F (36.6 C) 98.6 F (37 C) 98.6 F (37 C) 97.6 F (36.4 C)  TempSrc: Axillary     SpO2: 99% 96% 98% 99%  Weight:      Height:        Intake/Output Summary (Last 24 hours) at 03/24/2022 1836 Last data filed at 03/24/2022 1300 Gross per 24 hour  Intake 240 ml  Output --  Net 240 ml   Filed Weights   03/22/22 1052  Weight: 54.4 kg    Examination: Calm, NAD Cta no w/r Reg s1/s2 no gallop Soft benign +bs No edema Aaoxox3  Mood and affect appropriate in current setting     Data Reviewed: I have personally reviewed following labs  and imaging studies  CBC: Recent Labs  Lab 03/22/22 1200  WBC 6.3  NEUTROABS 3.8  HGB 13.7  HCT 40.1  MCV 93.3  PLT 458   Basic Metabolic Panel: Recent Labs  Lab 03/22/22 1200 03/24/22 1027  NA 137 138  K 3.9 4.7  CL 105 108  CO2 21* 22  GLUCOSE 136* 143*  BUN 16 17  CREATININE 0.65 0.76  CALCIUM 9.7 9.7   GFR: Estimated Creatinine Clearance: 35.3 mL/min (by C-G formula based on SCr of 0.76 mg/dL). Liver Function Tests: Recent Labs  Lab 03/22/22 1200  AST 22  ALT 17  ALKPHOS 73  BILITOT 0.8  PROT 7.0  ALBUMIN 4.1   No results for input(s): "LIPASE", "AMYLASE" in the last 168 hours. No results for input(s): "AMMONIA" in the last 168 hours. Coagulation Profile: No results for input(s): "INR", "PROTIME" in the last 168 hours. Cardiac Enzymes: No results for input(s): "CKTOTAL", "CKMB", "CKMBINDEX", "TROPONINI" in the last 168 hours. BNP (last 3 results) No results for input(s): "PROBNP" in the last 8760 hours. HbA1C: No results for input(s): "HGBA1C" in the last 72 hours. CBG: No results for input(s): "GLUCAP" in the last 168 hours. Lipid Profile: No results for input(s): "CHOL", "HDL", "LDLCALC", "TRIG", "CHOLHDL", "LDLDIRECT" in the last 72 hours. Thyroid Function Tests: No  results for input(s): "TSH", "T4TOTAL", "FREET4", "T3FREE", "THYROIDAB" in the last 72 hours. Anemia Panel: No results for input(s): "VITAMINB12", "FOLATE", "FERRITIN", "TIBC", "IRON", "RETICCTPCT" in the last 72 hours. Sepsis Labs: No results for input(s): "PROCALCITON", "LATICACIDVEN" in the last 168 hours.  No results found for this or any previous visit (from the past 240 hour(s)).       Radiology Studies: No results found.      Scheduled Meds:  acetaminophen  1,000 mg Oral Q8H   atorvastatin  20 mg Oral Daily   calcium-vitamin D  1 tablet Oral Daily   clopidogrel  75 mg Oral Daily   enoxaparin (LOVENOX) injection  40 mg Subcutaneous Q24H   levothyroxine  25 mcg  Oral QAC breakfast   lidocaine  1 patch Transdermal Q24H   loratadine  10 mg Oral Daily   magnesium oxide  400 mg Oral Daily   metoprolol succinate  25 mg Oral q morning   montelukast  10 mg Oral QHS   multivitamin with minerals  1 tablet Oral Daily   multivitamin-lutein  1 capsule Oral BID   pantoprazole  40 mg Oral Daily   senna  1 tablet Oral BID   Continuous Infusions:  Assessment & Plan:   Principal Problem:   Compression fracture of L2 (HCC) Active Problems:   Acquired hypothyroidism   Coronary artery disease   Benign essential hypertension   GERD (gastroesophageal reflux disease)   Hyperlipidemia, unspecified  Acute L2 compression fracture:  Found on MRI Neurosurgery consulted- no kyphoplasty  LSO brace ordered  Continue Plavix   pain control, lidocaine patch will be ordered  7/10 PT rec. HH. Awaiting LSO brace   Coronary artery disease:  Continue plavix and statin   Hyperlipidemia:  Continue statin   Hypertension:  Bp on lower side Hold Cozaar, nifedipine 7/10 continue toprol xl Start losartan '25mg'$        GERD:  Continue Protonix      Hypothyroidism:  Continue Synthroid      DVT prophylaxis: Lovenox Code Status: DNR Family Communication: Family at bedside Disposition Plan: Back home Status is: Observation The patient remains OBS appropriate and will d/c before 2 midnights.        LOS: 0 days   Time spent: 35 minutes    Nolberto Hanlon, MD [facility] Pager [phone]  If 7PM-7AM, please contact night-coverage 03/24/2022, 6:36 PM

## 2022-03-25 DIAGNOSIS — E039 Hypothyroidism, unspecified: Secondary | ICD-10-CM | POA: Diagnosis not present

## 2022-03-25 DIAGNOSIS — I10 Essential (primary) hypertension: Secondary | ICD-10-CM | POA: Diagnosis not present

## 2022-03-25 DIAGNOSIS — S32020A Wedge compression fracture of second lumbar vertebra, initial encounter for closed fracture: Secondary | ICD-10-CM | POA: Diagnosis not present

## 2022-03-25 DIAGNOSIS — I2510 Atherosclerotic heart disease of native coronary artery without angina pectoris: Secondary | ICD-10-CM | POA: Diagnosis not present

## 2022-03-25 LAB — CBC
HCT: 40.7 % (ref 36.0–46.0)
Hemoglobin: 13.8 g/dL (ref 12.0–15.0)
MCH: 31.4 pg (ref 26.0–34.0)
MCHC: 33.9 g/dL (ref 30.0–36.0)
MCV: 92.5 fL (ref 80.0–100.0)
Platelets: 160 10*3/uL (ref 150–400)
RBC: 4.4 MIL/uL (ref 3.87–5.11)
RDW: 12.6 % (ref 11.5–15.5)
WBC: 5.7 10*3/uL (ref 4.0–10.5)
nRBC: 0 % (ref 0.0–0.2)

## 2022-03-25 LAB — POTASSIUM: Potassium: 4.1 mmol/L (ref 3.5–5.1)

## 2022-03-25 MED ORDER — LIDOCAINE 5 % EX PTCH: 1.0000 | MEDICATED_PATCH | CUTANEOUS | 0 refills | Status: DC

## 2022-03-25 MED ORDER — ACETAMINOPHEN 500 MG PO TABS: 1000.0000 mg | ORAL_TABLET | Freq: Three times a day (TID) | ORAL | 0 refills | Status: AC | PRN

## 2022-03-25 NOTE — Plan of Care (Signed)

## 2022-03-25 NOTE — Progress Notes (Signed)
Patient was given verbal and written discharge instructions, she acknowledge understanding,patient has been taken out to car by wheelchair, no distress noted when leaving the floor.

## 2022-03-25 NOTE — TOC Progression Note (Signed)
Transition of Care [facility]) - Progression Note    Patient Details  Name: Rachael Humphrey MRN: [id number] Date of Birth: [birth year]  Transition of Care [facility]) CM/SW [place], RN Phone Number: 03/25/2022, 10:55 AM  Clinical Narrative:   RNCM spoke to [facility]  They will deliver bed today.  Patient is discharged today, family to transport home.  Patient has LSO brace at bedside.      Expected Discharge Plan: [place] Barriers to Discharge: Continued Medical Work up  Expected Discharge Plan and Services Expected Discharge Plan: [place]   Discharge Planning Services: CM Consult Post Acute Care Choice: Home Health, Durable Medical Equipment Living arrangements for the past 2 months: Single Family Home Expected Discharge Date: 03/25/22               DME Arranged: Hospital bed DME Agency: [facility] Date DME Agency Contacted: 03/24/22 Time DME Agency Contacted: [phone] Representative spoke with at DME Agency: [place]: PT, OT [place] Agency: [place] ([place]) Date [place]: 03/24/22 Time [place]: 1549 Representative spoke with at [place]: Floydene Flock   Social Determinants of Health (SDOH) Interventions    Readmission Risk Interventions     No data to display

## 2022-03-25 NOTE — Discharge Summary (Signed)
Rachael Humphrey KXF:[id number] DOB: Mar 22, 1930 DOA: 03/22/2022  PCP: Idelle Crouch, MD  Admit date: 03/22/2022 Discharge date: 03/25/2022  Admitted From: Home Disposition: Home  Recommendations for Outpatient Follow-up:  Follow up with PCP in 1 week Please obtain BMP/CBC in one week Please follow up with neurosurgery as scheduled  Home Health: Yes   Discharge Condition:Stable CODE STATUS: DNR This woman is killing me Diet recommendation: Heart Healthy  Brief/Interim Summary: Per IRC:VELFY Rachael Humphrey is a 88 y.o. female with a past medical history of osteoporosis, coronary artery disease s/p PCI, history of colon cancer, hypertension, hyperlipidemia, hypothyroidism, history of kyphoplasty.   This patient presents to the emergency department due to back pain worsened after ground-level mechanical fall about a week ago.  She was in the kitchen getting ready to go to church when her feet got tangled up and she fell.  Since then her pain has worsened and she has been unable to ambulate due to her pain.  Normally pretty high functioning at home.  Occasionally uses a cane to assist with ambulation.  She lives alone at home but family checks on her daily.  Requests no opioid pain medications due to getting very sedated.  Denies any saddle anesthesia.  No urinary incontinence.  No stool incontinence.  No fevers or chills. Previous kyphoplasty by Dr. Rudene Christians.   ED Course: Labs were obtained which were fairly unremarkable.  Vital signs are stable.  Urinalysis was negative as well.  MRIs showing an acute L2 compression fracture.  Please see full report of MRI below.  Neurosurgery consulted.  Acute L2 compression fracture:  Found on MRI Neurosurgery consulted- no kyphoplasty  LSO brace ordered  Continue Plavix  PT rec. HH.    Coronary artery disease:  Continue plavix and statin     Hyperlipidemia:  Continue statin     Hypertension:  Continue home meds        GERD:  Continue Protonix         Hypothyroidism:  Continue Synthroid       Discharge Diagnoses:  Principal Problem:   Compression fracture of L2 (Rachael Humphrey) Active Problems:   Acquired hypothyroidism   Coronary artery disease   Benign essential hypertension   GERD (gastroesophageal reflux disease)   Hyperlipidemia, unspecified    Discharge Instructions  Discharge Instructions     Call MD for:  severe uncontrolled pain   Complete by: As directed    Diet - low sodium heart healthy   Complete by: As directed    Discharge instructions   Complete by: As directed    Do not exceed 3 grams of tylenol per 24hrs. Wear your brace with sitting, activity.   Increase activity slowly   Complete by: As directed       Allergies as of 03/25/2022       Reactions   Codeine Nausea And Vomiting   Darvon [propoxyphene] Other (See Comments)   Altered mental status    Demerol [meperidine] Other (See Comments)   Altered mental status    Gabapentin Other (See Comments)   Sulfa Antibiotics Other (See Comments)   Tramadol Other (See Comments)   "makes me crazy"  "makes me crazy"    Voltaren [diclofenac Sodium] Other (See Comments)   "Makes me crazy" "Makes me crazy"   Zinc    Hurting in lower stomach         Medication List     TAKE these medications    acetaminophen 500 MG tablet Commonly known  as: TYLENOL Take 2 tablets (1,000 mg total) by mouth every 8 (eight) hours as needed.   atorvastatin 20 MG tablet Commonly known as: LIPITOR Take 20 mg by mouth daily. Reported on 09/18/2015   CALCIUM 600 + D PO Take 1 tablet by mouth daily.   cetirizine 10 MG tablet Commonly known as: ZYRTEC Take 10 mg by mouth daily.   clopidogrel 75 MG tablet Commonly known as: PLAVIX Take 75 mg by mouth daily.   fluticasone 50 MCG/ACT nasal spray Commonly known as: FLONASE Place 2 sprays into both nostrils daily as needed for allergies.   levothyroxine 25 MCG tablet Commonly known as: SYNTHROID Take 25 mcg by  mouth daily before breakfast.   lidocaine 5 % Commonly known as: LIDODERM Place 1 patch onto the skin daily. Remove & Discard patch within 12 hours or as directed by MD Start taking on: March 26, 2022   losartan 25 MG tablet Commonly known as: COZAAR Take 25 mg by mouth daily.   metoprolol succinate 50 MG 24 hr tablet Commonly known as: TOPROL-XL Take 75 mg by mouth every morning.   montelukast 10 MG tablet Commonly known as: SINGULAIR Take 10 mg by mouth at bedtime.   multivitamin with minerals Tabs tablet Take 1 tablet by mouth daily.   NIFEdipine 30 MG 24 hr tablet Commonly known as: PROCARDIA-XL/NIFEDICAL-XL Take 30 mg by mouth daily.   pantoprazole 40 MG tablet Commonly known as: PROTONIX Take 40 mg by mouth daily.   PRESERVISION AREDS 2 PO Take 1 tablet by mouth 2 (two) times daily.               Durable Medical Equipment  (From admission, onward)           Start     Ordered   03/24/22 1511  For home use only DME Hospital bed  Once       Question Answer Comment  Length of Need Lifetime   Bed type Semi-electric   Hoyer Lift Yes   Trapeze Bar Yes   Support Surface: Gel Overlay      03/24/22 1510            Follow-up Information     Meade Maw, MD Follow up in 1 week(s).   Specialty: Neurosurgery Why: 1-2 weeks if no appointment made for her already by neurosurgery Contact information: [address] [place] [address] [phone]         Idelle Crouch, MD Follow up in 1 week(s).   Specialty: Internal Medicine Contact information: [place] [address] [phone]                Allergies  Allergen Reactions   Codeine Nausea And Vomiting   Darvon [Propoxyphene] Other (See Comments)    Altered mental status     Demerol [Meperidine] Other (See Comments)    Altered mental status     Gabapentin Other (See Comments)   Sulfa Antibiotics Other (See  Comments)   Tramadol Other (See Comments)    "makes me crazy"  "makes me crazy"    Voltaren [Diclofenac Sodium] Other (See Comments)    "Makes me crazy" "Makes me crazy"   Zinc     Hurting in lower stomach     Consultations: Neurosurgery   Procedures/Studies: MR LUMBAR SPINE WO CONTRAST  Result Date: 03/22/2022 CLINICAL DATA:  Chronic low back pain.  [place] a week ago. EXAM: MRI LUMBAR SPINE WITHOUT CONTRAST  TECHNIQUE: Multiplanar, multisequence MR imaging of the lumbar spine was performed. No intravenous contrast was administered. COMPARISON:  CT lumbar spine from same day. MRI lumbar spine dated April 16, 2021. FINDINGS: Segmentation: Transitional lumbosacral anatomy again noted with lumbarization of S1. Alignment:  Unchanged trace retrolisthesis at L1-L2. Vertebrae: Acute appearing L2 superior endplate compression fracture with 15% height loss and mild marrow edema. No retropulsion. Chronic T11, T12, L3, and L4 compression deformities status post cement augmentation. Conus medullaris and cauda equina: Conus extends to the L1-L2 level. Conus and cauda equina appear normal. Paraspinal and other soft tissues: Negative. Disc levels: T12-L1: Unchanged mild disc bulging with left paracentral endplate spurring. No stenosis. L1-L2: Unchanged mild disc bulging and bilateral facet arthropathy. Unchanged mild right neuroforaminal stenosis. No spinal canal or left neuroforaminal stenosis. L2-L3: Unchanged mild foraminal disc bulging and mild bilateral facet arthropathy. No stenosis. L3-L4: Unchanged mild disc bulging with moderate right and mild left facet arthropathy. Unchanged mild bilateral lateral recess stenosis. No spinal canal or neuroforaminal stenosis. L4-L5: Unchanged small broad-based posterior disc protrusion and severe bilateral facet arthropathy with resultant moderate to severe spinal canal stenosis and severe right neuroforaminal stenosis. No left neuroforaminal stenosis. L5-S1: Transitional  level. Unchanged mild left neuroforaminal stenosis due to foraminal endplate spurring and left facet arthropathy. No spinal canal or right neuroforaminal stenosis. IMPRESSION: 1. Acute L2 superior endplate compression fracture with 15% height loss. No retropulsion. 2. Chronic T11, T12, L3, and L4 compression deformities status post cement augmentation. 3. Unchanged multilevel lumbar spondylosis as described above, worst at L4-L5 where there is moderate to severe spinal canal stenosis and severe right neuroforaminal stenosis. Electronically Signed   By: Titus Dubin M.D.   On: 03/22/2022 17:51   CT L-SPINE NO CHARGE  Result Date: 03/22/2022 CLINICAL DATA:  Mechanical fall EXAM: CT LUMBAR SPINE WITHOUT CONTRAST TECHNIQUE: Multidetector CT imaging of the lumbar spine was performed without intravenous contrast administration. Multiplanar CT image reconstructions were also generated. RADIATION DOSE REDUCTION: This exam was performed according to the departmental dose-optimization program which includes automated exposure control, adjustment of the mA and/or kV according to patient size and/or use of iterative reconstruction technique. COMPARISON:  MRI lumbar spine 04/16/2021, radiograph 04/25/2021. FINDINGS: Segmentation: There is a transitional lumbosacral vertebrae with lumbarized S1 and rudimentary disc at S1-S2. Numbering is consistent with prior MRI 04/16/2021. Independent review of these levels should be performed prior to any planned intervention. Alignment: Trace retrolisthesis at L1-L2, unchanged. Vertebrae: Osteopenia. There are chronic compression deformities with prior vertebroplasties involving T11, T12, L3, and L4. There is no evidence of progressive height loss. There is a new compression fracture of L2 with approximately 20% height loss. There is a fracture line disrupting the superior and inferior endplates. Paraspinal and other soft tissues: Reported separately on CT abdomen and pelvis. Disc  levels: There is multilevel degenerative disc disease and facet arthropathy, similar resulting in varying degrees of spinal canal neural foraminal stenosis. This is most prominent at L4-L5 where there is severe spinal canal stenosis and severe right neural foraminal stenosis, as demonstrated on prior lumbar spine MRI, not significantly changed. IMPRESSION: New compression fracture of L2 with approximately 20% height loss. Multilevel degenerative disc disease and facet arthropathy, most pronounced at L4-L5 with severe spinal canal stenosis and right neural foraminal stenosis, similar to prior MRI. Unchanged chronic compression deformities of T11, T12, L3, and L4 with prior vertebroplasties. Electronically Signed   By: Maurine Simmering M.D.   On: 03/22/2022 15:13   CT ABDOMEN  PELVIS W CONTRAST  Result Date: 03/22/2022 CLINICAL DATA:  Abdominal pain, acute, nonlocalized EXAM: CT ABDOMEN AND PELVIS WITH CONTRAST TECHNIQUE: Multidetector CT imaging of the abdomen and pelvis was performed using the standard protocol following bolus administration of intravenous contrast. RADIATION DOSE REDUCTION: This exam was performed according to the departmental dose-optimization program which includes automated exposure control, adjustment of the mA and/or kV according to patient size and/or use of iterative reconstruction technique. CONTRAST:  130m OMNIPAQUE IOHEXOL 300 MG/ML  SOLN COMPARISON:  CT pelvis 11/19/2020, CT abdomen pelvis 04/01/2014 lumbar spine 04/16/2021 FINDINGS: Lower chest: Bibasilar scarring and atelectasis. Cardiomegaly. Mild peripheral fibrotic change. Bilateral breast implants with calcified capsules. Hepatobiliary: No focal liver abnormality is seen. The gallbladder is unremarkable. Pancreas: Unremarkable. No pancreatic ductal dilatation or surrounding inflammatory changes. Spleen: Normal in size without focal abnormality. Adrenals/Urinary Tract: Stable 1.8 cm left adrenal nodule since July 2015, likely a benign  adenoma. The right adrenal gland is normal. There is prominence of the renal, right collecting systems bilaterally greater than left, with decompressed ureters and no obstructing stone identified. No perinephric or periureteral stranding. There are small bilateral renal cysts too small to characterize. There is a punctate nonobstructive stone in the right upper pole and a 3 mm stone in the right lower pole. There is a 3 mm stone in the lower pole the left kidney which is nonobstructive. The bladder is markedly distended. Stomach/Bowel: Large hiatal hernia. There is no evidence of bowel obstruction.Postsurgical changes in the region of the cecum. The appendix is surgically absent. No pericecal inflammation. Extensive sigmoid diverticulosis. No diverticulitis. Moderate stool burden. Vascular/Lymphatic: Aortoiliac atherosclerosis. No AAA. No lymphadenopathy. Reproductive: There is a pessary in place.  No adnexal mass. Other: Fat containing ventral lower abdominal wall hernia with a 2.0 cm aperture, and stranding within the herniated fat. There is a left periumbilical fat containing hernia with a 1.8 cm aperture. No bowel containing hernia. No ascites. No free air. Musculoskeletal: There is a transitional lumbosacral vertebrae present, likely lumbarized S1 with rudimentary disc at S1-S2. Numbering is consistent with prior lumbar spine MRI 04/16/2021. There are multiple chronic compression deformities with vertebroplasties, including T11, T12, L3, and L4. There is a new compression fracture of L2 with approximately 20% height loss. There is a fracture line disrupting the superior and inferior endplate cortex (series 513, image 58). There is severe inter spinous degenerative change and multilevel facet arthropathy. There is multilevel degenerative disc disease with most severe disc height loss at L5-S1. No suspicious osseous lesion. Diffuse osteopenia. IMPRESSION: Prominence of the renal collecting systems bilaterally,  likely due to a markedly distended urinary bladder. Bilateral nonobstructive nephrolithiasis without obstructing ureter stone identified. Fat containing ventral lower abdominal wall hernia with some stranding within the herniated fat, which can be seen with fat ischemia. Correlate with point tenderness and reducibility. No bowel containing hernia. New compression fracture of L2 with approximately 20% height loss. Postsurgical changes in the right lower quadrant. No bowel obstruction. Sigmoid diverticulosis without evidence of acute diverticulitis. Large hiatal hernia. Cardiomegaly.  Mild peripheral fibrotic change in the lung bases. Electronically Signed   By: JMaurine SimmeringM.D.   On: 03/22/2022 15:08   DG Lumbar Spine Complete  Result Date: 03/22/2022 CLINICAL DATA:  Fall. EXAM: LUMBAR SPINE - COMPLETE 4+ VIEW COMPARISON:  04/25/2021 FINDINGS: Bones are markedly demineralized. Status post vertebral augmentation at T11, T12, L3, and L4. No evidence for acute fracture involving the on augmented levels. SI joints unremarkable. Loss of disc  height noted T12-L1 and L5-S1. IMPRESSION: 1. Marked osteopenia with multiple vertebral augmentation. No acute fracture evident. 2. Degenerative disc disease at T12-L1 and L5-S1. Electronically Signed   By: Misty Stanley M.D.   On: 03/22/2022 11:51      Subjective: No no new complaints  Discharge Exam: Vitals:   03/25/22 0552 03/25/22 0727  BP: (!) 145/81 (!) 139/96  Pulse: (!) 58 (!) 57  Resp: 16 16  Temp: 98.3 F (36.8 C) 98.3 F (36.8 C)  SpO2: 96% 99%   Vitals:   03/24/22 0916 03/24/22 2019 03/25/22 0552 03/25/22 0727  BP: (!) 152/94 (!) 181/96 (!) 145/81 (!) 139/96  Pulse: 77 63 (!) 58 (!) 57  Resp: '18 18 16 16  '$ Temp: 97.6 F (36.4 C) (!) 97.5 F (36.4 C) 98.3 F (36.8 C) 98.3 F (36.8 C)  TempSrc:      SpO2: 99% 100% 96% 99%  Weight:      Height:        General: Pt is alert, awake, not in acute distress Cardiovascular: RRR, S1/S2 +, no rubs,  no gallops Respiratory: CTA bilaterally, no wheezing, no rhonchi Abdominal: Soft, NT, ND, bowel sounds + Extremities: no edema, no cyanosis    The results of significant diagnostics from this hospitalization (including imaging, microbiology, ancillary and laboratory) are listed below for reference.     Microbiology: No results found for this or any previous visit (from the past 240 hour(s)).   Labs: BNP (last 3 results) No results for input(s): "BNP" in the last 8760 hours. Basic Metabolic Panel: Recent Labs  Lab 03/22/22 1200 03/24/22 1027 03/25/22 0528  NA 137 138  --   K 3.9 4.7 4.1  CL 105 108  --   CO2 21* 22  --   GLUCOSE 136* 143*  --   BUN 16 17  --   CREATININE 0.65 0.76  --   CALCIUM 9.7 9.7  --    Liver Function Tests: Recent Labs  Lab 03/22/22 1200  AST 22  ALT 17  ALKPHOS 73  BILITOT 0.8  PROT 7.0  ALBUMIN 4.1   No results for input(s): "LIPASE", "AMYLASE" in the last 168 hours. No results for input(s): "AMMONIA" in the last 168 hours. CBC: Recent Labs  Lab 03/22/22 1200 03/25/22 0528  WBC 6.3 5.7  NEUTROABS 3.8  --   HGB 13.7 13.8  HCT 40.1 40.7  MCV 93.3 92.5  PLT 155 160   Cardiac Enzymes: No results for input(s): "CKTOTAL", "CKMB", "CKMBINDEX", "TROPONINI" in the last 168 hours. BNP: Invalid input(s): "POCBNP" CBG: No results for input(s): "GLUCAP" in the last 168 hours. D-Dimer No results for input(s): "DDIMER" in the last 72 hours. Hgb A1c No results for input(s): "HGBA1C" in the last 72 hours. Lipid Profile No results for input(s): "CHOL", "HDL", "LDLCALC", "TRIG", "CHOLHDL", "LDLDIRECT" in the last 72 hours. Thyroid function studies No results for input(s): "TSH", "T4TOTAL", "T3FREE", "THYROIDAB" in the last 72 hours.  Invalid input(s): "FREET3" Anemia work up No results for input(s): "VITAMINB12", "FOLATE", "FERRITIN", "TIBC", "IRON", "RETICCTPCT" in the last 72 hours. Urinalysis    Component Value Date/Time    COLORURINE STRAW (A) 03/22/2022 1520   APPEARANCEUR CLEAR (A) 03/22/2022 1520   APPEARANCEUR Clear 04/01/2014 0246   LABSPEC 1.016 03/22/2022 1520   LABSPEC 1.006 04/01/2014 0246   PHURINE 7.0 03/22/2022 1520   GLUCOSEU NEGATIVE 03/22/2022 1520   GLUCOSEU >=500 04/01/2014 0246   HGBUR NEGATIVE 03/22/2022 1520   BILIRUBINUR NEGATIVE 03/22/2022  [place] Negative 04/01/2014 0246   KETONESUR NEGATIVE 03/22/2022 [place] 03/22/2022 1520   NITRITE NEGATIVE 03/22/2022 1520   LEUKOCYTESUR NEGATIVE 03/22/2022 1520   LEUKOCYTESUR Negative 04/01/2014 0246   Sepsis Labs Recent Labs  Lab 03/22/22 1200 03/25/22 0528  WBC 6.3 5.7   Microbiology No results found for this or any previous visit (from the past 240 hour(s)).   Time coordinating discharge: Over 30 minutes  SIGNED:   Nolberto Hanlon, MD  [facility] 03/25/2022, 10:18 AM Pager   If 7PM-7AM, please contact night-coverage [email or web address] Password [email or web address]

## 2022-04-08 ENCOUNTER — Other Ambulatory Visit: Payer: Self-pay

## 2022-04-08 DIAGNOSIS — S32020A Wedge compression fracture of second lumbar vertebra, initial encounter for closed fracture: Secondary | ICD-10-CM

## 2022-04-10 ENCOUNTER — Encounter: Payer: Self-pay | Admitting: Neurosurgery

## 2022-04-10 ENCOUNTER — Ambulatory Visit
Admission: RE | Admit: 2022-04-10 | Discharge: 2022-04-10 | Disposition: A | Discharge status: Home / Self Care | Payer: Medicare Other | Attending: Neurosurgery | Admitting: Neurosurgery

## 2022-04-10 ENCOUNTER — Ambulatory Visit
Admission: RE | Admit: 2022-04-10 | Discharge: 2022-04-10 | Disposition: A | Discharge status: Home / Self Care | Payer: Medicare Other | Source: Ambulatory Visit | Attending: Neurosurgery | Admitting: Neurosurgery

## 2022-04-10 ENCOUNTER — Ambulatory Visit: Payer: Medicare Other | Admitting: Neurosurgery

## 2022-04-10 VITALS — BP 118/70 | Ht 60.24 in | Wt 120.0 lb

## 2022-04-10 DIAGNOSIS — S32020A Wedge compression fracture of second lumbar vertebra, initial encounter for closed fracture: Secondary | ICD-10-CM | POA: Diagnosis not present

## 2022-04-10 DIAGNOSIS — S32020D Wedge compression fracture of second lumbar vertebra, subsequent encounter for fracture with routine healing: Secondary | ICD-10-CM

## 2022-04-10 NOTE — Progress Notes (Signed)
Referring Physician:  Nolberto Hanlon, MD [place],  [place] [address]  Primary Physician:  Idelle Crouch, MD  History of Present Illness: 04/10/2022 Ms. Rachael Humphrey is a 88 y.o following up today for a L2 fracture.  She states she had a fall in her kitchen about a month ago and presented to the ER on 03/22/2022 with increased back pain.  She is found to have a mild L2 compression fracture.  This was treated conservatively in an LSO brace. Today she reports improvement of her back pain although she does continue to have pain with changing positions.  She also endorses cramping in her legs bilaterally which has been present since before the fracture.  She denies any new or worsening neurologic symptoms.  Past Surgery: Multiple previous kyphoplasties  The symptoms are causing a significant impact on the patient's life.   Review of Systems:  A 10 point review of systems is negative, except for the pertinent positives and negatives detailed in the HPI.  Past Medical History: Past Medical History:  Diagnosis Date   Adenocarcinoma, colon ([place]) 2005   Biatrial enlargement    Chronic anticoagulation    Clopidogrel   Complication of anesthesia    Coronary artery disease    a.) 2.75 x 28 mm Vision BMS to pLAD on 02/23/2014   GERD (gastroesophageal reflux disease)    Grade II diastolic dysfunction    H/O hiatal hernia    Hemorrhoids    internal-not a problem now.   History of kidney stones    multiple   History of methicillin resistant staphylococcus aureus (MRSA) 2020   History of shingles 04/18/2014   HLD (hyperlipidemia)    Hypertension    Hypothyroidism    Myocardial infarction (HCC)    PONV (postoperative nausea and vomiting)    Senile osteoporosis    T2DM (type 2 diabetes mellitus) (HCC)    TIA (transient ischemic attack)    Valvular regurgitation    panvalvular    Past Surgical History: Past Surgical History:  Procedure Laterality Date    APPENDECTOMY     AUGMENTATION MAMMAPLASTY Bilateral 1978   BREAST BIOPSY Right    over 30 years ago negative   BREAST BIOPSY Left    negative over 30 years ago   CATARACT EXTRACTION, BILATERAL     COLECTOMY     partial small and large instestine removed   CORONARY ANGIOPLASTY WITH STENT PLACEMENT N/A 02/23/2014   2.75 x 28 mm Vision BMS x 1 to pLAD   CYSTOSCOPY WITH RETROGRADE PYELOGRAM, URETEROSCOPY AND STENT PLACEMENT Bilateral 05/17/2014   Procedure: CYSTOSCOPY WITH RETROGRADE PYELOGRAM, URETEROSCOPY AND STENT PLACEMENT, BASKET STONE REMOVAL;  Surgeon: Alexis Frock, MD;  Location: [facility];  Service: Urology;  Laterality: Bilateral;   KYPHOPLASTY N/A 11/25/2018   Procedure: KYPHOPLASTY T11, DIABETIC;  Surgeon: Hessie Knows, MD;  Location: [facility];  Service: Orthopedics;  Laterality: N/A;   KYPHOPLASTY N/A 11/28/2020   Procedure: [id number]  USE VASCULAR BED;  Surgeon: Hessie Knows, MD;  Location: [facility];  Service: Orthopedics;  Laterality: N/A;   KYPHOPLASTY N/A 04/25/2021   Procedure: L 3 KYPHOPLASTY;  Surgeon: Hessie Knows, MD;  Location: [facility];  Service: Orthopedics;  Laterality: N/A;    Allergies: Allergies as of 04/10/2022 - Review Complete 04/10/2022  Allergen Reaction Noted   Codeine Nausea And Vomiting 05/03/2014   Darvon [propoxyphene] Other (See Comments) 02/01/2014   Demerol [meperidine] Other (See Comments) 02/01/2014   Gabapentin Other (See  Comments) 02/01/2014   Sulfa antibiotics Other (See Comments) 02/01/2014   Tramadol Other (See Comments) 02/01/2014   Voltaren [diclofenac sodium] Other (See Comments) 02/01/2014   Zinc  05/03/2014    Medications: Outpatient Encounter Medications as of 04/10/2022  Medication Sig   acetaminophen (TYLENOL) 500 MG tablet Take 2 tablets (1,000 mg total) by mouth every 8 (eight) hours as needed.   atorvastatin (LIPITOR) 20 MG tablet Take 20 mg by mouth daily. Reported on 09/18/2015   Calcium Carb-Cholecalciferol (CALCIUM  600 + D PO) Take 1 tablet by mouth daily.   cetirizine (ZYRTEC) 10 MG tablet Take 10 mg by mouth daily.   clopidogrel (PLAVIX) 75 MG tablet Take 75 mg by mouth daily.    fluticasone (FLONASE) 50 MCG/ACT nasal spray Place 2 sprays into both nostrils daily as needed for allergies.    levothyroxine (SYNTHROID, LEVOTHROID) 25 MCG tablet Take 25 mcg by mouth daily before breakfast.    losartan (COZAAR) 25 MG tablet Take 25 mg by mouth daily.   metoprolol succinate (TOPROL-XL) 50 MG 24 hr tablet Take 75 mg by mouth every morning.    montelukast (SINGULAIR) 10 MG tablet Take 10 mg by mouth at bedtime.    Multiple Vitamin (MULTIVITAMIN WITH MINERALS) TABS tablet Take 1 tablet by mouth daily.   NIFEdipine (PROCARDIA-XL/NIFEDICAL-XL) 30 MG 24 hr tablet Take 30 mg by mouth daily.   pantoprazole (PROTONIX) 40 MG tablet Take 40 mg by mouth daily.    [DISCONTINUED] lidocaine (LIDODERM) 5 % Place 1 patch onto the skin daily. Remove & Discard patch within 12 hours or as directed by MD (Patient not taking: Reported on 04/10/2022)   [DISCONTINUED] Multiple Vitamins-Minerals (PRESERVISION AREDS 2 PO) Take 1 tablet by mouth 2 (two) times daily.  (Patient not taking: Reported on 04/10/2022)   No facility-administered encounter medications on file as of 04/10/2022.    Social History: Social History   Tobacco Use   Smoking status: Never   Smokeless tobacco: Never  Vaping Use   Vaping Use: Never used  Substance Use Topics   Alcohol use: No   Drug use: No    Family Medical History: Family History  Problem Relation Age of Onset   Stroke Mother    Breast cancer Sister 62    Physical Examination: Today's Vitals   04/10/22 0927  BP: 118/70  Weight: 54.4 kg  Height: 5' 0.24" (1.53 m)  PainSc: 6   PainLoc: Back   Body mass index is 23.25 kg/m.   General: Patient is well developed, well nourished, calm, collected, and in no apparent distress. Attention to examination is  appropriate.  Psychiatric: Patient is non-anxious.  Head:  Pupils equal, round, and reactive to light.  ENT:  Oral mucosa appears well hydrated.  Neck:   Supple.   Respiratory: Patient is breathing without any difficulty.  Extremities: No edema.  Vascular: Palpable dorsal pedal pulses.  Skin:   On exposed skin, there are no abnormal skin lesions.  NEUROLOGICAL:     Awake, alert, oriented to person, place, and time.  Speech is clear and fluent. Fund of knowledge is appropriate.   Cranial Nerves: Pupils equal round and reactive to light.  Facial tone is symmetric.  Facial sensation is symmetric. Shoulder shrug is symmetric.  ROM of spine: full.  Palpation of spine: TTP over thoracolumbar spine without any obvious deformity or step-off  Strength: Side Biceps Triceps Deltoid Interossei Grip Wrist Ext. Wrist Flex.  R '5 5 5 5 5 5 '$ 5  L '5 5 5 5 5 5 5   '$ Side Iliopsoas Quads Hamstring PF DF EHL  R '5 5 5 5 5 5  '$ L '5 5 5 5 5 5   '$ Reflexes are 1+ and symmetric at the biceps, triceps, brachioradialis, patella and achilles.   Hoffman's is absent.  Clonus is not present.  Toes are down-going.  Decree sensation to light touch throughout bilateral lower extremities Ambulates with the assistance of a 4 pronged cane  Medical Decision Making  Imaging: 04/10/22 lumbar xrays Mild progression of L2 endplate deformity without any significant segmental kyphosis  I have personally reviewed the images and agree with the above interpretation.  Assessment and Plan: Ms. Spink is a pleasant 88 y.o. female with a history of multiple compression fractures with the most recent L2 acute compression fracture after a fall.  Her x-rays do show some mild progression.  She states that her back pain has improved some although she does continue to have some pain with getting in and out of bed.  We discussed the utility of a referral for kyphoplasty however her fracture is still mild.  She would like to think  about this and will let us know if she would like to consider this in the future.  We will otherwise see her back in 8 weeks with repeat lumbar x-rays.  She is encouraged to continue to wear her LSO brace when out of bed and ambulating.  She was encouraged to call the office in the interim should she have any questions or concerns.  She expressed understanding was in agreement with this plan.  Thank you for involving me in the care of this patient.   I spent a total of 43 minutes in both face-to-face and non-face-to-face activities for this visit on the date of this encounter including review of records, image review, obtaining HPI, physical exam, discussion of treatment options, and documentation.  [facility] Dept. of Neurosurgery

## 2022-06-04 NOTE — Progress Notes (Unsigned)
Referring Physician:  Idelle Crouch, MD [facility] [place],  [place] [address]  Primary Physician:  Idelle Crouch, MD  History of Present Illness:  06/04/2022 Ms. Rachael Humphrey is a 88 y.o following up today for a L2 fracture DOI 03/22/2022 s/p fall. She was placed in LSO brace.   History of multiple kyphoplasties. Discussed sending for kyphoplasty at her last visit with Andee Poles and she wanted to hold off.   She is here for follow up and repeat xrays.   She had a a few recent falls since her last visit. Had a fall last week landing on her left side. She has increased pain in left side of ribs with pain wrapping around to under her left breast. This is her primary complaint. She thinks her back pain is better. She denies any new or worsening neurologic symptoms.  Past Surgery: Multiple previous kyphoplasties  The symptoms are causing a significant impact on the patient's life.   Review of Systems:  A 10 point review of systems is negative, except for the pertinent positives and negatives detailed in the HPI.  Past Medical History: Past Medical History:  Diagnosis Date   Adenocarcinoma, colon ([place]) 2005   Biatrial enlargement    Chronic anticoagulation    Clopidogrel   Complication of anesthesia    Coronary artery disease    a.) 2.75 x 28 mm Vision BMS to pLAD on 02/23/2014   GERD (gastroesophageal reflux disease)    Grade II diastolic dysfunction    H/O hiatal hernia    Hemorrhoids    internal-not a problem now.   History of kidney stones    multiple   History of methicillin resistant staphylococcus aureus (MRSA) 2020   History of shingles 04/18/2014   HLD (hyperlipidemia)    Hypertension    Hypothyroidism    Myocardial infarction (HCC)    PONV (postoperative nausea and vomiting)    Senile osteoporosis    T2DM (type 2 diabetes mellitus) (HCC)    TIA (transient ischemic attack)    Valvular regurgitation    panvalvular    Past  Surgical History: Past Surgical History:  Procedure Laterality Date   APPENDECTOMY     AUGMENTATION MAMMAPLASTY Bilateral 1978   BREAST BIOPSY Right    over 30 years ago negative   BREAST BIOPSY Left    negative over 30 years ago   CATARACT EXTRACTION, BILATERAL     COLECTOMY     partial small and large instestine removed   CORONARY ANGIOPLASTY WITH STENT PLACEMENT N/A 02/23/2014   2.75 x 28 mm Vision BMS x 1 to pLAD   CYSTOSCOPY WITH RETROGRADE PYELOGRAM, URETEROSCOPY AND STENT PLACEMENT Bilateral 05/17/2014   Procedure: CYSTOSCOPY WITH RETROGRADE PYELOGRAM, URETEROSCOPY AND STENT PLACEMENT, BASKET STONE REMOVAL;  Surgeon: Alexis Frock, MD;  Location: [facility];  Service: Urology;  Laterality: Bilateral;   KYPHOPLASTY N/A 11/25/2018   Procedure: KYPHOPLASTY T11, DIABETIC;  Surgeon: Hessie Knows, MD;  Location: [facility];  Service: Orthopedics;  Laterality: N/A;   KYPHOPLASTY N/A 11/28/2020   Procedure: [id number]  USE VASCULAR BED;  Surgeon: Hessie Knows, MD;  Location: [facility];  Service: Orthopedics;  Laterality: N/A;   KYPHOPLASTY N/A 04/25/2021   Procedure: L 3 KYPHOPLASTY;  Surgeon: Hessie Knows, MD;  Location: [facility];  Service: Orthopedics;  Laterality: N/A;    Allergies: Allergies as of 06/10/2022 - Review Complete 04/10/2022  Allergen Reaction Noted   Codeine Nausea And Vomiting 05/03/2014   Darvon [  propoxyphene] Other (See Comments) 02/01/2014   Demerol [meperidine] Other (See Comments) 02/01/2014   Gabapentin Other (See Comments) 02/01/2014   Sulfa antibiotics Other (See Comments) 02/01/2014   Tramadol Other (See Comments) 02/01/2014   Voltaren [diclofenac sodium] Other (See Comments) 02/01/2014   Zinc  05/03/2014    Medications: Outpatient Encounter Medications as of 06/10/2022  Medication Sig   acetaminophen (TYLENOL) 500 MG tablet Take 2 tablets (1,000 mg total) by mouth every 8 (eight) hours as needed.   atorvastatin (LIPITOR) 20 MG tablet Take 20 mg by  mouth daily. Reported on 09/18/2015   Calcium Carb-Cholecalciferol (CALCIUM 600 + D PO) Take 1 tablet by mouth daily.   cetirizine (ZYRTEC) 10 MG tablet Take 10 mg by mouth daily.   clopidogrel (PLAVIX) 75 MG tablet Take 75 mg by mouth daily.    fluticasone (FLONASE) 50 MCG/ACT nasal spray Place 2 sprays into both nostrils daily as needed for allergies.    levothyroxine (SYNTHROID, LEVOTHROID) 25 MCG tablet Take 25 mcg by mouth daily before breakfast.    losartan (COZAAR) 25 MG tablet Take 25 mg by mouth daily.   metoprolol succinate (TOPROL-XL) 50 MG 24 hr tablet Take 75 mg by mouth every morning.    montelukast (SINGULAIR) 10 MG tablet Take 10 mg by mouth at bedtime.    Multiple Vitamin (MULTIVITAMIN WITH MINERALS) TABS tablet Take 1 tablet by mouth daily.   NIFEdipine (PROCARDIA-XL/NIFEDICAL-XL) 30 MG 24 hr tablet Take 30 mg by mouth daily.   pantoprazole (PROTONIX) 40 MG tablet Take 40 mg by mouth daily.    No facility-administered encounter medications on file as of 06/10/2022.    Social History: Social History   Tobacco Use   Smoking status: Never   Smokeless tobacco: Never  Vaping Use   Vaping Use: Never used  Substance Use Topics   Alcohol use: No   Drug use: No    Family Medical History: Family History  Problem Relation Age of Onset   Stroke Mother    Breast cancer Sister 61    Physical Examination: There were no vitals filed for this visit.  There is no height or weight on file to calculate BMI.   General: Patient is well developed, well nourished, calm, collected, and in no apparent distress. Attention to examination is appropriate.  Psychiatric: Patient is non-anxious.  Head:  Pupils equal, round, and reactive to light.  ENT:  Oral mucosa appears well hydrated.  Neck:   Supple.   Respiratory: Patient is breathing without any difficulty.  Skin:   On exposed skin, there are no abnormal skin lesions.  NEUROLOGICAL:     Awake, alert, oriented to person,  place, and time.  Speech is clear and fluent. Fund of knowledge is appropriate.   Cranial Nerves: Pupils equal round and reactive to light.  Facial tone is symmetric.  Facial sensation is symmetric. Shoulder shrug is symmetric.  Palpation of spine: she has no tenderness over thoracolumbar spine. No obvious deformity or step-off noted  She has tenderness over lower ribs on left side.   Strength: Side Biceps Triceps Deltoid Interossei Grip Wrist Ext. Wrist Flex.  R '5 5 5 5 5 5 5  '$ L '5 5 5 5 5 5 5   '$ Side Iliopsoas Quads Hamstring PF DF EHL  R '5 5 5 5 5 5  '$ L '5 5 5 5 5 5   '$ Reflexes are 1+ and symmetric at the biceps, triceps, brachioradialis, patella and achilles.   Hoffman's is absent.  Clonus is not present.   Decree sensation to light touch throughout bilateral lower extremities  Ambulates with the assistance of a 4 pronged cane  Medical Decision Making  Imaging:  Lumbar xrays 06/09/22:  No change in compression at L2 in comparison to previous xrays. Previous kyphoplasty at T11, T12, L3, and L4.   04/10/22 lumbar xrays Mild progression of L2 endplate deformity without any significant segmental kyphosis  I have personally reviewed the images and agree with the above interpretation.  Assessment and Plan:  Ms. Hommes is a pleasant 88 y.o. female with a history of multiple compression fractures and multiple kyphoplasty procedures. Most recently with  L2 fracture DOI 03/22/2022 s/p fall.   She had a a few recent falls since her last visit. Had a fall last week landing on her left side with increased pain in left side of ribs  wrapping around to under her left breast. This is her primary complaint. She thinks that overall her back pain is better.  Lumbar xrays from today show L2 compression fracture that is stable. Previous kyphoplasty at T11, T12, L3, and L4. No new compression fractures appreciated.   Treatment options reviewed with patient and following plan made:   - Discussed  pain at length, at this point she is having more pain in her ribs on left. Her lower back pain is improving. If it gets worse, can consider MRI lumbar spine and possible referral for kyphoplasty. She wants to hold off on this for now.  - Continue with LSO brace when up and walking. Remove to sleep.  - No bending twisting or lifting.  - Follow up with flexion/extension lumbar xrays in 6 weeks (will be 3+ months out from initial fall for L2 fracture).  - Follow up with PCP as scheduled tomorrow for left sided rib pain.   I spent a total of30 minutes in both face-to-face and non-face-to-face activities for this visit on the date of this encounter including review of records, image review, obtaining HPI, physical exam, discussion of treatment options, and documentation.  Geronimo Boot PA-C Dept. of Neurosurgery

## 2022-06-09 ENCOUNTER — Other Ambulatory Visit: Payer: Self-pay

## 2022-06-09 DIAGNOSIS — S32020D Wedge compression fracture of second lumbar vertebra, subsequent encounter for fracture with routine healing: Secondary | ICD-10-CM

## 2022-06-09 DIAGNOSIS — S32020A Wedge compression fracture of second lumbar vertebra, initial encounter for closed fracture: Secondary | ICD-10-CM

## 2022-06-10 ENCOUNTER — Ambulatory Visit
Admission: RE | Admit: 2022-06-10 | Discharge: 2022-06-10 | Disposition: A | Discharge status: Home / Self Care | Payer: Medicare Other | Source: Ambulatory Visit | Attending: Orthopedic Surgery | Admitting: Orthopedic Surgery

## 2022-06-10 ENCOUNTER — Ambulatory Visit: Payer: Medicare Other | Admitting: Orthopedic Surgery

## 2022-06-10 ENCOUNTER — Ambulatory Visit
Admission: RE | Admit: 2022-06-10 | Discharge: 2022-06-10 | Disposition: A | Discharge status: Home / Self Care | Payer: Medicare Other | Attending: Orthopedic Surgery | Admitting: Orthopedic Surgery

## 2022-06-10 ENCOUNTER — Encounter: Payer: Self-pay | Admitting: Orthopedic Surgery

## 2022-06-10 VITALS — BP 114/74 | Ht 60.24 in | Wt 120.0 lb

## 2022-06-10 DIAGNOSIS — S32020D Wedge compression fracture of second lumbar vertebra, subsequent encounter for fracture with routine healing: Secondary | ICD-10-CM

## 2022-07-22 ENCOUNTER — Ambulatory Visit: Payer: Medicare Other | Admitting: Orthopedic Surgery

## 2022-07-26 ENCOUNTER — Emergency Department: Payer: Medicare Other

## 2022-07-26 ENCOUNTER — Other Ambulatory Visit: Payer: Self-pay

## 2022-07-26 ENCOUNTER — Encounter: Payer: Self-pay | Admitting: *Deleted

## 2022-07-26 ENCOUNTER — Emergency Department
Admission: EM | Admit: 2022-07-26 | Discharge: 2022-07-26 | Disposition: A | Discharge status: Home / Self Care | Payer: Medicare Other | Attending: Emergency Medicine | Admitting: Emergency Medicine

## 2022-07-26 DIAGNOSIS — Z7902 Long term (current) use of antithrombotics/antiplatelets: Secondary | ICD-10-CM | POA: Diagnosis not present

## 2022-07-26 DIAGNOSIS — E119 Type 2 diabetes mellitus without complications: Secondary | ICD-10-CM | POA: Diagnosis not present

## 2022-07-26 DIAGNOSIS — Y9389 Activity, other specified: Secondary | ICD-10-CM | POA: Diagnosis not present

## 2022-07-26 DIAGNOSIS — W01198A Fall on same level from slipping, tripping and stumbling with subsequent striking against other object, initial encounter: Secondary | ICD-10-CM | POA: Insufficient documentation

## 2022-07-26 DIAGNOSIS — K011 Impacted teeth: Secondary | ICD-10-CM | POA: Insufficient documentation

## 2022-07-26 DIAGNOSIS — W19XXXA Unspecified fall, initial encounter: Secondary | ICD-10-CM

## 2022-07-26 DIAGNOSIS — S0083XA Contusion of other part of head, initial encounter: Secondary | ICD-10-CM | POA: Diagnosis not present

## 2022-07-26 DIAGNOSIS — S80212A Abrasion, left knee, initial encounter: Secondary | ICD-10-CM | POA: Diagnosis not present

## 2022-07-26 DIAGNOSIS — S0993XA Unspecified injury of face, initial encounter: Secondary | ICD-10-CM | POA: Diagnosis present

## 2022-07-26 DIAGNOSIS — I10 Essential (primary) hypertension: Secondary | ICD-10-CM | POA: Diagnosis not present

## 2022-07-26 DIAGNOSIS — I2510 Atherosclerotic heart disease of native coronary artery without angina pectoris: Secondary | ICD-10-CM | POA: Diagnosis not present

## 2022-07-26 MED ORDER — CHLORHEXIDINE GLUCONATE 0.12 % MT SOLN: 15.0000 mL | Freq: Two times a day (BID) | OROMUCOSAL | 0 refills | Status: DC

## 2022-07-26 MED ORDER — ACETAMINOPHEN 500 MG PO TABS
1000.0000 mg | ORAL_TABLET | Freq: Once | ORAL | Status: AC
Start: 2022-07-26 — End: 2022-07-26
  Administered 2022-07-26: 1000 mg via ORAL
  Filled 2022-07-26: qty 2

## 2022-07-26 MED ORDER — CHLORHEXIDINE GLUCONATE 0.12 % MT SOLN
15.0000 mL | Freq: Four times a day (QID) | OROMUCOSAL | Status: DC
  Administered 2022-07-26: 15 mL via OROMUCOSAL

## 2022-07-26 MED ORDER — AMOXICILLIN-POT CLAVULANATE 875-125 MG PO TABS: 1.0000 | ORAL_TABLET | Freq: Two times a day (BID) | ORAL | 0 refills | Status: AC

## 2022-07-26 NOTE — Discharge Instructions (Signed)
Follow-up with your Dentist in the next 1-2 days  You will likely need an Oral surgeon for your tooth - I've provided the number for a surgeon in [place] for the next week  Take the antibiotic to prevent infection

## 2022-07-26 NOTE — ED Provider Notes (Signed)
[facility] Provider Note    Event Date/Time   First MD Initiated Contact with Patient 07/26/22 1225     (approximate)   History   Fall   HPI  Rachael Humphrey is a 88 y.o. female here with fall.  The patient was outside picking up leaves when she fell forward.  She struck her face directly on the ground.  She denies any loss of consciousness.  She also struck her left knee.  She complains of left knee pain as well as facial and tooth pain.  She had a bloody nose as well as blood from her mouth.  Denies any illness prior to this.  She does take Plavix.  No focal numbness or weakness.  No neck pain.  No other complaints.     Physical Exam   Triage Vital Signs: ED Triage Vitals  Enc Vitals Group     BP 07/26/22 1229 129/73     Pulse Rate 07/26/22 1229 76     Resp 07/26/22 1229 18     Temp 07/26/22 1229 98.2 F (36.8 C)     Temp Source 07/26/22 1229 Oral     SpO2 07/26/22 1229 100 %     Weight 07/26/22 1232 117 lb (53.1 kg)     Height 07/26/22 1232 '5\' 1"'$  (1.549 m)     Head Circumference --      Peak Flow --      Pain Score 07/26/22 1231 6     Pain Loc --      Pain Edu? --      Excl. in [place]? --     Most recent vital signs: Vitals:   07/26/22 1229  BP: 129/73  Pulse: 76  Resp: 18  Temp: 98.2 F (36.8 C)  SpO2: 100%     General: Awake, no distress.  CV:  Good peripheral perfusion.  Regular rate and rhythm. Resp:  Normal effort.  Lungs clear to auscultation bilaterally. Abd:  No distention.  No tenderness. Other:  On ENT exam, patient has bruising to the right anterior forehead.  Small amount of epistaxis noted dried around the right nares but nasal airways appear patent with no overt septal deviation.  There is obvious dental trauma with significant impaction of the right lateral incisor.  Bleeding is controlled.  No open lacerations.  Tenderness to palpation over the left anterolateral knee though no overt swelling.  Superficial  abrasion.   ED Results / Procedures / Treatments   Labs (all labs ordered are listed, but only abnormal results are displayed) Labs Reviewed - No data to display   EKG    RADIOLOGY DG knee left: Cannot exclude nondisplaced tibial plateau fracture, recommend CT Chest x-ray: Negative   I also independently reviewed and agree with radiologist interpretations.   PROCEDURES:  Critical Care performed: Yes, see critical care procedure note(s)   MEDICATIONS ORDERED IN ED: Medications  chlorhexidine (PERIDEX) 0.12 % solution 15 mL (15 mLs Mouth/Throat Given 07/26/22 1516)  acetaminophen (TYLENOL) tablet 1,000 mg (1,000 mg Oral Given 07/26/22 1443)     IMPRESSION / MDM / ASSESSMENT AND PLAN / ED COURSE  I reviewed the triage vital signs and the nursing notes.                               This patient presents to the ED for concern of fall, facial pain, this involves an extensive number of treatment  options, and is a complaint that carries with it a high risk of complications and morbidity.  The differential diagnosis includes facial fx, contusion, dental avulsion/intrusion, cervical spine injury.    Co morbidities that complicate the patient evaluation  Osteoporosis DM HTN CAD on Plavix   Additional history obtained:  Additional history obtained from: family External records from outside source obtained and reviewed including PCP visits, office visit with Dr. Doy Hutching 07/24/2022   Lab Tests:  I Ordered, and personally interpreted labs.  The pertinent results include:   None   Imaging Studies ordered:  I ordered imaging studies including CT Head/C Spine/Face  I independently visualized and interpreted imaging which showed: No acute fx/malalignment XR knee ? Fx, CT knee shows no acute fx (pt states she had previously fractured this knee) I agree with the radiologist interpretation   Problem List / ED Course / Critical interventions / Medication  management  Fall Mechanical in nature, h/o recurrent falls that she has been seeing her PCP about CT Head/CSpine/Face negative. Dental intrusion of right lateral incisor noted - will refer to Dentistry and OMFS, advise soft diet, chlorhexidine mouth wash and ppx abx in the setting of open wound to the area Pt had pain to L knee but is ambulatory, no TTP on exam on repeat and suspect findings on plain films are chronic I ordered medication including tylenol for pain  Reevaluation of the patient after these medicines showed that the patient improved I have reviewed the patients home medicines and have made adjustments as needed   Social Determinants of Health:  Elderly but very healthy for age, independent   Test / Admission - Considered:  No acute injuries, ambulating w/o difficulty - d/c home   FINAL CLINICAL IMPRESSION(S) / ED DIAGNOSES   Final diagnoses:  Fall, initial encounter  Contusion of face, initial encounter  Dental impaction     Rx / DC Orders   ED Discharge Orders          Ordered    chlorhexidine (PERIDEX) 0.12 % solution  2 times daily        07/26/22 1540    amoxicillin-clavulanate (AUGMENTIN) 875-125 MG tablet  2 times daily        07/26/22 1540             Note:  This document was prepared using Dragon voice recognition software and may include unintentional dictation errors.   Duffy Bruce, MD 07/26/22 1930

## 2022-07-26 NOTE — ED Triage Notes (Addendum)
Per EMT report, patient had a mechanical fall outside while picking up leaves. Patient states she fell on the cement on her face. Patient denies LOC, N/V or dizziness per EMT. Patient has an abrasion on lower lip, under nose and chin, abrasion left knee, and skin tear on right wrist which is wrapped by EMTs. Patient states she lost an upper dental implant, which has not been found. Patient is alert and oriented x4 upon arrival. Patient is calm. Patient states family was helping her get the leaves up when this happened. Patient is on Plavix.

## 2022-07-26 NOTE — ED Notes (Signed)
The pt's right wrist and right index finger were cleaned with saline, covered with a vaseline guaze, a non stick bandage, and then a curling wrap.

## 2022-07-26 NOTE — ED Notes (Signed)
This tech assisted pt in ambulating to restroom down the hall. Pt was able to ambulate well with minimal assistance. Provider aware.

## 2022-07-26 NOTE — ED Notes (Signed)
Please see triage note for injuries. Bleeding is controlled with the abrasions. Patient has clear speech with mouth injuries, though dried blood is noted on lips and in mouth. Chin appears swollen. 5th finger on right hand is bloodied and painful, wrapped in a bandaid per EMT. Patient denies discomfort with moving right wrist. Patient states she is still recovering from T7 injury. Patient has a brace and was wearing it today, but is not present upon assessment.

## 2022-07-28 ENCOUNTER — Other Ambulatory Visit: Payer: Self-pay

## 2022-07-28 DIAGNOSIS — S32020D Wedge compression fracture of second lumbar vertebra, subsequent encounter for fracture with routine healing: Secondary | ICD-10-CM

## 2022-07-28 NOTE — Progress Notes (Unsigned)
Referring Physician:  Geronimo Boot, PA-C [address] [place],  [place] [address]  Primary Physician:  Idelle Crouch, MD  History of Present Illness: Ms. Rachael Humphrey is a 88 y.o following up today for a L2 fracture DOI 03/22/2022 s/p fall. She was placed in LSO brace.   History of multiple kyphoplasties. Last seen by me on 06/04/22. She's continued to have falls, however back pain was improving.   She is here for follow up and repeat xrays.   Was seen in ED on 07/26/22 s/p fall. No complaints of back pain while in the ED.   She states today that since her recent fall that she has increased pain in her back. She feels like her legs "don't work." She has difficulty walking or standing for more than 20 minutes. She feels like she is falling so much because her legs give way. Her legs feel heavy. No radicular leg pain. No numbness, tingling, or weakness in her legs.   She is on PLAVIX.   Past Surgery: Multiple previous kyphoplasties  The symptoms are causing a significant impact on the patient's life.   Review of Systems:  A 10 point review of systems is negative, except for the pertinent positives and negatives detailed in the HPI.  Past Medical History: Past Medical History:  Diagnosis Date   Adenocarcinoma, colon ([facility]) 2005   Biatrial enlargement    Chronic anticoagulation    Clopidogrel   Complication of anesthesia    Coronary artery disease    a.) 2.75 x 28 mm Vision BMS to pLAD on 02/23/2014   GERD (gastroesophageal reflux disease)    Grade II diastolic dysfunction    H/O hiatal hernia    Hemorrhoids    internal-not a problem now.   History of kidney stones    multiple   History of methicillin resistant staphylococcus aureus (MRSA) 2020   History of shingles 04/18/2014   HLD (hyperlipidemia)    Hypertension    Hypothyroidism    Myocardial infarction (HCC)    PONV (postoperative nausea and vomiting)    Senile osteoporosis    T2DM (type 2 diabetes  mellitus) (HCC)    TIA (transient ischemic attack)    Valvular regurgitation    panvalvular    Past Surgical History: Past Surgical History:  Procedure Laterality Date   APPENDECTOMY     AUGMENTATION MAMMAPLASTY Bilateral 1978   BREAST BIOPSY Right    over 30 years ago negative   BREAST BIOPSY Left    negative over 30 years ago   CATARACT EXTRACTION, BILATERAL     COLECTOMY     partial small and large instestine removed   CORONARY ANGIOPLASTY WITH STENT PLACEMENT N/A 02/23/2014   2.75 x 28 mm Vision BMS x 1 to pLAD   CYSTOSCOPY WITH RETROGRADE PYELOGRAM, URETEROSCOPY AND STENT PLACEMENT Bilateral 05/17/2014   Procedure: CYSTOSCOPY WITH RETROGRADE PYELOGRAM, URETEROSCOPY AND STENT PLACEMENT, BASKET STONE REMOVAL;  Surgeon: Alexis Frock, MD;  Location: [facility];  Service: Urology;  Laterality: Bilateral;   KYPHOPLASTY N/A 11/25/2018   Procedure: KYPHOPLASTY T11, DIABETIC;  Surgeon: Hessie Knows, MD;  Location: [facility];  Service: Orthopedics;  Laterality: N/A;   KYPHOPLASTY N/A 11/28/2020   Procedure: [id number]  USE VASCULAR BED;  Surgeon: Hessie Knows, MD;  Location: [facility];  Service: Orthopedics;  Laterality: N/A;   KYPHOPLASTY N/A 04/25/2021   Procedure: L 3 KYPHOPLASTY;  Surgeon: Hessie Knows, MD;  Location: [facility];  Service: Orthopedics;  Laterality: N/A;  Allergies: Allergies as of 07/29/2022 - Review Complete 07/29/2022  Allergen Reaction Noted   Codeine Nausea And Vomiting 05/03/2014   Darvon [propoxyphene] Other (See Comments) 02/01/2014   Demerol [meperidine] Other (See Comments) 02/01/2014   Gabapentin Other (See Comments) 02/01/2014   Sulfa antibiotics Other (See Comments) 02/01/2014   Tramadol Other (See Comments) 02/01/2014   Voltaren [diclofenac sodium] Other (See Comments) 02/01/2014   Zinc  05/03/2014    Medications: Outpatient Encounter Medications as of 07/29/2022  Medication Sig   acetaminophen (TYLENOL) 500 MG tablet Take 2 tablets (1,000  mg total) by mouth every 8 (eight) hours as needed.   amoxicillin-clavulanate (AUGMENTIN) 875-125 MG tablet Take 1 tablet by mouth 2 (two) times daily for 7 days.   Calcium Carb-Cholecalciferol (CALCIUM 600 + D PO) Take 1 tablet by mouth daily.   cetirizine (ZYRTEC) 10 MG tablet Take 10 mg by mouth daily.   chlorhexidine (PERIDEX) 0.12 % solution Use as directed 15 mLs in the mouth or throat 2 (two) times daily.   clopidogrel (PLAVIX) 75 MG tablet Take 75 mg by mouth daily.    fluticasone (FLONASE) 50 MCG/ACT nasal spray Place 2 sprays into both nostrils daily as needed for allergies.    levothyroxine (SYNTHROID, LEVOTHROID) 25 MCG tablet Take 25 mcg by mouth daily before breakfast.    metoprolol succinate (TOPROL-XL) 50 MG 24 hr tablet Take 75 mg by mouth every morning.    montelukast (SINGULAIR) 10 MG tablet Take 10 mg by mouth at bedtime.    Multiple Vitamin (MULTIVITAMIN WITH MINERALS) TABS tablet Take 1 tablet by mouth daily.   NIFEdipine (PROCARDIA-XL/NIFEDICAL-XL) 30 MG 24 hr tablet Take 30 mg by mouth daily.   pantoprazole (PROTONIX) 40 MG tablet Take 40 mg by mouth daily.    [DISCONTINUED] atorvastatin (LIPITOR) 20 MG tablet Take 20 mg by mouth daily. Reported on 09/18/2015 (Patient not taking: Reported on 07/29/2022)   [DISCONTINUED] losartan (COZAAR) 25 MG tablet Take 25 mg by mouth daily. (Patient not taking: Reported on 07/29/2022)   No facility-administered encounter medications on file as of 07/29/2022.    Social History: Social History   Tobacco Use   Smoking status: Never   Smokeless tobacco: Never  Vaping Use   Vaping Use: Never used  Substance Use Topics   Alcohol use: No   Drug use: No    Family Medical History: Family History  Problem Relation Age of Onset   Stroke Mother    Breast cancer Sister 38    Physical Examination: Today's Vitals   07/29/22 1439  BP: 118/78  Weight: 117 lb (53.1 kg)  Height: '5\' 1"'$  (1.549 m)  PainSc: 5   PainLoc: Back    Body  mass index is 22.11 kg/m.  Awake, alert, oriented to person, place, and time.  Speech is clear and fluent. Fund of knowledge is appropriate.   She has significant bruising on her face.   Palpation of spine: she has no gross TL tenderness. No obvious deformity or step-off noted  Strength:  Side Iliopsoas Quads Hamstring PF DF EHL  R '5 5 5 5 5 5  '$ L '5 5 5 5 5 5   '$ Reflexes are 1+ and symmetric at the biceps, triceps, brachioradialis, patella and achilles.   Hoffman's is absent.  Clonus is not present.   Sensation is intact to light touch throughout bilateral lower extremities  Ambulates with the assistance of a 4 pronged cane  Medical Decision Making  Imaging: Lumbar xrays dated 07/29/22:  No  change in compression at L2 in comparison to previous xrays. Previous kyphoplasty at T11, T12, L3, and L4.   Above xray report not available for my review.   Lumbar xrays 06/09/22:  No change in compression at L2 in comparison to previous xrays. Previous kyphoplasty at T11, T12, L3, and L4.   Assessment and Plan:  Ms. Goucher is a pleasant 88 y.o. female with a history of multiple compression fractures and multiple kyphoplasty procedures. Have been following her most recently for L2 fracture DOI 03/22/2022 s/p fall.   She notes some increased back pain since a fall over the weekend. No radicular leg pain, but she feels like her legs "don't work." She has difficulty walking or standing for more than 20 minutes. She feels like she is falling so much because her legs give way and they feel heavy.  Updated lumbar xrays show no change in L2 compression fracture. It is stable. Noted are previous kyphoplasty at T11, T12, L3, and L4. No new compression fractures appreciated.   Lumbar MRI from 03/22/22 showed severe central stenosis L4-L5. This could explain some of the symptoms in her legs.   Treatment options reviewed with patient and following plan made:   - She can continue with back brace prn. She  should avoid bending, twisting, or lifting.  - No further follow up needed for L2 compression fracture.  - We discussed that her leg symptoms may be from spinal stenosis L4-L5. She is not interested in any surgery options for this.  - We will call to check on her after the first of the year. If she wanted to discuss surgery options at that time, she may need to revisit PT (she did HHPT a month ago that may count) and then see Dr. Izora Ribas. Would also need cardiac clearance.   I spent a total of 20 minutes in both face-to-face and non-face-to-face activities for this visit on the date of this encounter including review of records, image review, obtaining HPI, physical exam, discussion of treatment options, and documentation.  Geronimo Boot PA-C Dept. of Neurosurgery

## 2022-07-29 ENCOUNTER — Ambulatory Visit: Payer: Medicare Other | Admitting: Orthopedic Surgery

## 2022-07-29 ENCOUNTER — Ambulatory Visit
Admission: RE | Admit: 2022-07-29 | Discharge: 2022-07-29 | Disposition: A | Discharge status: Home / Self Care | Payer: Medicare Other | Attending: Orthopedic Surgery | Admitting: Orthopedic Surgery

## 2022-07-29 ENCOUNTER — Encounter: Payer: Self-pay | Admitting: Orthopedic Surgery

## 2022-07-29 ENCOUNTER — Ambulatory Visit
Admission: RE | Admit: 2022-07-29 | Discharge: 2022-07-29 | Disposition: A | Discharge status: Home / Self Care | Payer: Medicare Other | Source: Ambulatory Visit | Attending: Orthopedic Surgery | Admitting: Orthopedic Surgery

## 2022-07-29 VITALS — BP 118/78 | Ht 61.0 in | Wt 117.0 lb

## 2022-07-29 DIAGNOSIS — M48061 Spinal stenosis, lumbar region without neurogenic claudication: Secondary | ICD-10-CM | POA: Diagnosis not present

## 2022-07-29 DIAGNOSIS — S32020D Wedge compression fracture of second lumbar vertebra, subsequent encounter for fracture with routine healing: Secondary | ICD-10-CM | POA: Insufficient documentation

## 2022-07-29 DIAGNOSIS — W19XXXD Unspecified fall, subsequent encounter: Secondary | ICD-10-CM | POA: Diagnosis not present

## 2022-09-25 ENCOUNTER — Telehealth: Payer: Self-pay | Admitting: Orthopedic Surgery

## 2022-09-25 NOTE — Telephone Encounter (Signed)
-----   Message from Peggyann Shoals sent at 09/25/2022  8:14 AM EST ----- She states that she is feeling fine. The pain is not better but not worse. She feels that this is something that she will have to learn to live with. [facility] is going to her home once a week and providing physical therapy.  She feels she is walking better so she is happy with that progress. Right now she has declined any further treatment with our office. She will call us if she needs anything.  ----- Message ----- From: Geronimo Boot, PA-C Sent: 09/25/2022   8:10 AM EST To: Peggyann Shoals  Please call to check on her. We talked at her last visit that some of her symptoms may be from stenosis.   How is she doing? Does she want to see Izora Ribas to discuss surgery options?   I am happy to call her or see her back in clinic if she has any questions.   Thanks!

## 2022-10-06 ENCOUNTER — Ambulatory Visit: Payer: Medicare Other | Admitting: Dermatology

## 2023-01-16 ENCOUNTER — Other Ambulatory Visit: Payer: Self-pay

## 2023-01-16 DIAGNOSIS — M5489 Other dorsalgia: Secondary | ICD-10-CM

## 2023-01-17 ENCOUNTER — Ambulatory Visit
Admission: RE | Admit: 2023-01-17 | Discharge: 2023-01-17 | Disposition: A | Discharge status: Home / Self Care | Payer: Medicare Other | Source: Ambulatory Visit | Attending: Internal Medicine | Admitting: Internal Medicine

## 2023-01-17 DIAGNOSIS — M5489 Other dorsalgia: Secondary | ICD-10-CM | POA: Diagnosis present

## 2023-05-06 ENCOUNTER — Emergency Department: Payer: Medicare Other

## 2023-05-06 ENCOUNTER — Other Ambulatory Visit: Payer: Self-pay

## 2023-05-06 ENCOUNTER — Inpatient Hospital Stay
Admission: EM | Admit: 2023-05-06 | Discharge: 2023-05-12 | DRG: 482 | Disposition: A | Discharge status: Skilled Nursing Facility | Payer: Medicare Other | Attending: Osteopathic Medicine | Admitting: Osteopathic Medicine

## 2023-05-06 DIAGNOSIS — Z79890 Hormone replacement therapy (postmenopausal): Secondary | ICD-10-CM | POA: Diagnosis not present

## 2023-05-06 DIAGNOSIS — I2510 Atherosclerotic heart disease of native coronary artery without angina pectoris: Secondary | ICD-10-CM | POA: Diagnosis present

## 2023-05-06 DIAGNOSIS — Y93H2 Activity, gardening and landscaping: Secondary | ICD-10-CM | POA: Diagnosis not present

## 2023-05-06 DIAGNOSIS — I252 Old myocardial infarction: Secondary | ICD-10-CM | POA: Diagnosis not present

## 2023-05-06 DIAGNOSIS — M62838 Other muscle spasm: Secondary | ICD-10-CM | POA: Diagnosis present

## 2023-05-06 DIAGNOSIS — Z7982 Long term (current) use of aspirin: Secondary | ICD-10-CM

## 2023-05-06 DIAGNOSIS — M810 Age-related osteoporosis without current pathological fracture: Secondary | ICD-10-CM | POA: Diagnosis present

## 2023-05-06 DIAGNOSIS — Z9181 History of falling: Secondary | ICD-10-CM

## 2023-05-06 DIAGNOSIS — Z7902 Long term (current) use of antithrombotics/antiplatelets: Secondary | ICD-10-CM

## 2023-05-06 DIAGNOSIS — E785 Hyperlipidemia, unspecified: Secondary | ICD-10-CM | POA: Diagnosis present

## 2023-05-06 DIAGNOSIS — Z66 Do not resuscitate: Secondary | ICD-10-CM | POA: Diagnosis present

## 2023-05-06 DIAGNOSIS — I10 Essential (primary) hypertension: Secondary | ICD-10-CM | POA: Diagnosis present

## 2023-05-06 DIAGNOSIS — Z888 Allergy status to other drugs, medicaments and biological substances status: Secondary | ICD-10-CM | POA: Diagnosis not present

## 2023-05-06 DIAGNOSIS — S72002A Fracture of unspecified part of neck of left femur, initial encounter for closed fracture: Secondary | ICD-10-CM

## 2023-05-06 DIAGNOSIS — Z8673 Personal history of transient ischemic attack (TIA), and cerebral infarction without residual deficits: Secondary | ICD-10-CM

## 2023-05-06 DIAGNOSIS — E119 Type 2 diabetes mellitus without complications: Secondary | ICD-10-CM | POA: Diagnosis present

## 2023-05-06 DIAGNOSIS — Z823 Family history of stroke: Secondary | ICD-10-CM

## 2023-05-06 DIAGNOSIS — Z1152 Encounter for screening for COVID-19: Secondary | ICD-10-CM | POA: Diagnosis not present

## 2023-05-06 DIAGNOSIS — Z955 Presence of coronary angioplasty implant and graft: Secondary | ICD-10-CM

## 2023-05-06 DIAGNOSIS — K219 Gastro-esophageal reflux disease without esophagitis: Secondary | ICD-10-CM | POA: Diagnosis present

## 2023-05-06 DIAGNOSIS — E039 Hypothyroidism, unspecified: Secondary | ICD-10-CM | POA: Diagnosis present

## 2023-05-06 DIAGNOSIS — U071 COVID-19: Secondary | ICD-10-CM

## 2023-05-06 DIAGNOSIS — Z8614 Personal history of Methicillin resistant Staphylococcus aureus infection: Secondary | ICD-10-CM

## 2023-05-06 DIAGNOSIS — Z79899 Other long term (current) drug therapy: Secondary | ICD-10-CM

## 2023-05-06 DIAGNOSIS — Y92007 Garden or yard of unspecified non-institutional (private) residence as the place of occurrence of the external cause: Secondary | ICD-10-CM

## 2023-05-06 DIAGNOSIS — Z885 Allergy status to narcotic agent status: Secondary | ICD-10-CM

## 2023-05-06 DIAGNOSIS — Z9049 Acquired absence of other specified parts of digestive tract: Secondary | ICD-10-CM

## 2023-05-06 DIAGNOSIS — Z8616 Personal history of COVID-19: Secondary | ICD-10-CM | POA: Diagnosis not present

## 2023-05-06 DIAGNOSIS — S72142A Displaced intertrochanteric fracture of left femur, initial encounter for closed fracture: Secondary | ICD-10-CM | POA: Diagnosis present

## 2023-05-06 DIAGNOSIS — Z803 Family history of malignant neoplasm of breast: Secondary | ICD-10-CM

## 2023-05-06 DIAGNOSIS — Z85038 Personal history of other malignant neoplasm of large intestine: Secondary | ICD-10-CM

## 2023-05-06 DIAGNOSIS — W19XXXA Unspecified fall, initial encounter: Secondary | ICD-10-CM | POA: Diagnosis present

## 2023-05-06 DIAGNOSIS — Z882 Allergy status to sulfonamides status: Secondary | ICD-10-CM

## 2023-05-06 DIAGNOSIS — Z7901 Long term (current) use of anticoagulants: Secondary | ICD-10-CM

## 2023-05-06 DIAGNOSIS — R54 Age-related physical debility: Secondary | ICD-10-CM | POA: Diagnosis present

## 2023-05-06 LAB — CBC WITH DIFFERENTIAL/PLATELET
Abs Immature Granulocytes: 0.04 10*3/uL (ref 0.00–0.07)
Basophils Absolute: 0 10*3/uL (ref 0.0–0.1)
Basophils Relative: 0 %
Eosinophils Absolute: 0.1 10*3/uL (ref 0.0–0.5)
Eosinophils Relative: 1 %
HCT: 39.7 % (ref 36.0–46.0)
Hemoglobin: 13.8 g/dL (ref 12.0–15.0)
Immature Granulocytes: 1 %
Lymphocytes Relative: 24 %
Lymphs Abs: 1.6 10*3/uL (ref 0.7–4.0)
MCH: 32.5 pg (ref 26.0–34.0)
MCHC: 34.8 g/dL (ref 30.0–36.0)
MCV: 93.4 fL (ref 80.0–100.0)
Monocytes Absolute: 0.6 10*3/uL (ref 0.1–1.0)
Monocytes Relative: 9 %
Neutro Abs: 4.5 10*3/uL (ref 1.7–7.7)
Neutrophils Relative %: 65 %
Platelets: 140 10*3/uL — ABNORMAL LOW (ref 150–400)
RBC: 4.25 MIL/uL (ref 3.87–5.11)
RDW: 12.9 % (ref 11.5–15.5)
WBC: 6.9 10*3/uL (ref 4.0–10.5)
nRBC: 0 % (ref 0.0–0.2)

## 2023-05-06 LAB — URINALYSIS, ROUTINE W REFLEX MICROSCOPIC
Bilirubin Urine: NEGATIVE
Glucose, UA: 50 mg/dL — AB
Hgb urine dipstick: NEGATIVE
Ketones, ur: NEGATIVE mg/dL
Nitrite: NEGATIVE
Protein, ur: NEGATIVE mg/dL
Specific Gravity, Urine: 1.009 (ref 1.005–1.030)
Squamous Epithelial / HPF: NONE SEEN /HPF (ref 0–5)
pH: 6 (ref 5.0–8.0)

## 2023-05-06 LAB — BASIC METABOLIC PANEL
Anion gap: 8 (ref 5–15)
BUN: 17 mg/dL (ref 8–23)
CO2: 21 mmol/L — ABNORMAL LOW (ref 22–32)
Calcium: 8.3 mg/dL — ABNORMAL LOW (ref 8.9–10.3)
Chloride: 106 mmol/L (ref 98–111)
Creatinine, Ser: 0.62 mg/dL (ref 0.44–1.00)
GFR, Estimated: >60 mL/min (ref >60)
Glucose, Bld: 160 mg/dL — ABNORMAL HIGH (ref 70–99)
Potassium: 4 mmol/L (ref 3.5–5.1)
Sodium: 135 mmol/L (ref 135–145)

## 2023-05-06 LAB — SARS CORONAVIRUS 2 BY RT PCR: SARS Coronavirus 2 by RT PCR: POSITIVE — AB

## 2023-05-06 MED ORDER — METHOCARBAMOL 500 MG PO TABS
500.0000 mg | ORAL_TABLET | Freq: Four times a day (QID) | ORAL | Status: DC | PRN
  Administered 2023-05-08 – 2023-05-12 (×7): 500 mg via ORAL
  Filled 2023-05-06 (×7): qty 1

## 2023-05-06 MED ORDER — ONDANSETRON HCL 4 MG/2ML IJ SOLN
4.0000 mg | Freq: Once | INTRAMUSCULAR | Status: AC
  Administered 2023-05-06: 4 mg via INTRAVENOUS
  Filled 2023-05-06: qty 2

## 2023-05-06 MED ORDER — LEVOTHYROXINE SODIUM 25 MCG PO TABS
25.0000 ug | ORAL_TABLET | Freq: Every day | ORAL | Status: DC
  Administered 2023-05-08 – 2023-05-12 (×5): 25 ug via ORAL
  Filled 2023-05-06 (×5): qty 1

## 2023-05-06 MED ORDER — INSULIN ASPART 100 UNIT/ML IJ SOLN
0.0000 [IU] | INTRAMUSCULAR | Status: DC
  Administered 2023-05-07: 2 [IU] via SUBCUTANEOUS
  Administered 2023-05-07 (×2): 3 [IU] via SUBCUTANEOUS
  Administered 2023-05-08: 2 [IU] via SUBCUTANEOUS
  Administered 2023-05-08 (×2): 1 [IU] via SUBCUTANEOUS
  Administered 2023-05-08: 3 [IU] via SUBCUTANEOUS
  Administered 2023-05-08: 2 [IU] via SUBCUTANEOUS
  Administered 2023-05-09: 1 [IU] via SUBCUTANEOUS
  Administered 2023-05-09: 5 [IU] via SUBCUTANEOUS
  Administered 2023-05-09: 1 [IU] via SUBCUTANEOUS
  Administered 2023-05-09 (×2): 2 [IU] via SUBCUTANEOUS
  Administered 2023-05-09 – 2023-05-10 (×2): 1 [IU] via SUBCUTANEOUS
  Administered 2023-05-10: 2 [IU] via SUBCUTANEOUS
  Administered 2023-05-10: 1 [IU] via SUBCUTANEOUS
  Administered 2023-05-10: 2 [IU] via SUBCUTANEOUS
  Administered 2023-05-10: 1 [IU] via SUBCUTANEOUS
  Administered 2023-05-11: 2 [IU] via SUBCUTANEOUS
  Administered 2023-05-11: 1 [IU] via SUBCUTANEOUS
  Administered 2023-05-11: 3 [IU] via SUBCUTANEOUS
  Administered 2023-05-11 – 2023-05-12 (×2): 2 [IU] via SUBCUTANEOUS
  Administered 2023-05-12: 1 [IU] via SUBCUTANEOUS
  Filled 2023-05-06 (×25): qty 1

## 2023-05-06 MED ORDER — GUAIFENESIN-DM 100-10 MG/5ML PO SYRP: 5.0000 mL | ORAL_SOLUTION | ORAL | Status: DC | PRN

## 2023-05-06 MED ORDER — MORPHINE SULFATE (PF) 2 MG/ML IV SOLN
2.0000 mg | INTRAVENOUS | Status: DC | PRN
  Administered 2023-05-06: 2 mg via INTRAVENOUS
  Filled 2023-05-06 (×3): qty 1

## 2023-05-06 MED ORDER — TRANEXAMIC ACID-NACL 1000-0.7 MG/100ML-% IV SOLN
1000.0000 mg | Freq: Once | INTRAVENOUS | Status: AC
  Administered 2023-05-06: 1000 mg via INTRAVENOUS
  Filled 2023-05-06: qty 100

## 2023-05-06 MED ORDER — MORPHINE SULFATE (PF) 2 MG/ML IV SOLN
2.0000 mg | INTRAVENOUS | Status: DC | PRN
  Administered 2023-05-07 (×2): 2 mg via INTRAVENOUS

## 2023-05-06 MED ORDER — ALBUTEROL SULFATE HFA 108 (90 BASE) MCG/ACT IN AERS: 1.0000 | INHALATION_SPRAY | RESPIRATORY_TRACT | Status: DC | PRN

## 2023-05-06 MED ORDER — HYDROCODONE-ACETAMINOPHEN 5-325 MG PO TABS
1.0000 | ORAL_TABLET | Freq: Four times a day (QID) | ORAL | Status: DC | PRN
  Administered 2023-05-08: 2 via ORAL
  Filled 2023-05-06: qty 2

## 2023-05-06 MED ORDER — ATORVASTATIN CALCIUM 20 MG PO TABS
20.0000 mg | ORAL_TABLET | Freq: Every day | ORAL | Status: DC
  Administered 2023-05-08 – 2023-05-12 (×5): 20 mg via ORAL
  Filled 2023-05-06 (×5): qty 1

## 2023-05-06 MED ORDER — METOPROLOL SUCCINATE ER 50 MG PO TB24: 75.0000 mg | ORAL_TABLET | Freq: Every morning | ORAL | Status: DC

## 2023-05-06 MED ORDER — METHOCARBAMOL 1000 MG/10ML IJ SOLN: 500.0000 mg | Freq: Four times a day (QID) | INTRAVENOUS | Status: DC | PRN

## 2023-05-06 MED ORDER — CEFAZOLIN SODIUM-DEXTROSE 2-4 GM/100ML-% IV SOLN
2.0000 g | INTRAVENOUS | Status: AC
  Administered 2023-05-07: 2 g via INTRAVENOUS
  Filled 2023-05-06: qty 100

## 2023-05-06 MED ORDER — MORPHINE SULFATE (PF) 2 MG/ML IV SOLN
2.0000 mg | Freq: Once | INTRAVENOUS | Status: AC
  Administered 2023-05-06: 2 mg via INTRAVENOUS
  Filled 2023-05-06: qty 1

## 2023-05-06 MED ORDER — ONDANSETRON HCL 4 MG/2ML IJ SOLN: 4.0000 mg | Freq: Four times a day (QID) | INTRAMUSCULAR | Status: DC | PRN

## 2023-05-06 NOTE — Progress Notes (Signed)
Full consult note and discussion with patient to follow tomorrow.  Called by ED staff. Imaging reviewed.  - Plan for surgery tomorrow, likely ~1230pm.  - NPO after midnight - Hold anticoagulation - Admit to Hospitalist team.  - Discussed findings and plan of care with Lucendia Herrlich, one of patient's primary caregivers, as patient was somewhat sleepy. She was in agreement to proceed and will plan to relay this information to the patient.

## 2023-05-06 NOTE — Assessment & Plan Note (Signed)
Patient with no anginal pain.  EKG nonacute Continue atorvastatin and metoprolol Holding Plavix for surgery

## 2023-05-06 NOTE — H&P (Signed)
History and Physical    Patient: Rachael Humphrey:[id number] DOB: 1930/03/20 DOA: 05/06/2023 DOS: the patient was seen and examined on 05/06/2023 PCP: Marguarite Arbour, MD  Patient coming from: Home  Chief Complaint:  Chief Complaint  Patient presents with   Fall    HPI: Rachael Humphrey is a 89 y.o. female with medical history significant for CAD s/p PCI, history of colon cancer, HTN, hypothyroidism, kyphoplasty and prior falls, who presents to the ED following an accidental fall while gardening.  Patient was recently diagnosed with COVID after having some mild respiratory symptoms and was improving and felt well enough to go out gardening.  She fell as she tried to turn around in the garden.  She had no preceding lightheadedness, chest pain, shortness of breath or palpitations and had no headache, visual disturbance, one-sided weakness numbness or tingling. ED course and data review: Vitals unremarkable, COVID-positive.  Other labs including CBC, BMP and UA are all unremarkable. EK G, personally viewed and interpreted showing nonspecific intraventricular conduction delay with rate of 71. CT left hip shows acute mildly impacted comminuted intertrochanteric fracture of the left femur. Chest x-ray with no acute cardiopulmonary disease. The ED provider contacted Dr. Signa Kell who will take patient to the OR on 8/21. Patient was treated with tranexamic acid and morphine Hospitalist consulted for admission.     Past Medical History:  Diagnosis Date   Adenocarcinoma, colon (HCC) 2005   Biatrial enlargement    Chronic anticoagulation    Clopidogrel   Complication of anesthesia    Coronary artery disease    a.) 2.75 x 28 mm Vision BMS to pLAD on 02/23/2014   GERD (gastroesophageal reflux disease)    Grade II diastolic dysfunction    H/O hiatal hernia    Hemorrhoids    internal-not a problem now.   History of kidney stones    multiple   History of methicillin resistant  staphylococcus aureus (MRSA) 2020   History of shingles 04/18/2014   HLD (hyperlipidemia)    Hypertension    Hypothyroidism    Myocardial infarction (HCC)    PONV (postoperative nausea and vomiting)    Senile osteoporosis    T2DM (type 2 diabetes mellitus) (HCC)    TIA (transient ischemic attack)    Valvular regurgitation    panvalvular   Past Surgical History:  Procedure Laterality Date   APPENDECTOMY     AUGMENTATION MAMMAPLASTY Bilateral 1978   BREAST BIOPSY Right    over 30 years ago negative   BREAST BIOPSY Left    negative over 30 years ago   CATARACT EXTRACTION, BILATERAL     COLECTOMY     partial small and large instestine removed   CORONARY ANGIOPLASTY WITH STENT PLACEMENT N/A 02/23/2014   2.75 x 28 mm Vision BMS x 1 to pLAD   CYSTOSCOPY WITH RETROGRADE PYELOGRAM, URETEROSCOPY AND STENT PLACEMENT Bilateral 05/17/2014   Procedure: CYSTOSCOPY WITH RETROGRADE PYELOGRAM, URETEROSCOPY AND STENT PLACEMENT, BASKET STONE REMOVAL;  Surgeon: Sebastian Ache, MD;  Location: [facility];  Service: Urology;  Laterality: Bilateral;   KYPHOPLASTY N/A 11/25/2018   Procedure: KYPHOPLASTY T11, DIABETIC;  Surgeon: Kennedy Bucker, MD;  Location: [facility];  Service: Orthopedics;  Laterality: N/A;   KYPHOPLASTY N/A 11/28/2020   Procedure: [id number]  USE VASCULAR BED;  Surgeon: Kennedy Bucker, MD;  Location: [facility];  Service: Orthopedics;  Laterality: N/A;   KYPHOPLASTY N/A 04/25/2021   Procedure: L 3 KYPHOPLASTY;  Surgeon: Kennedy Bucker, MD;  Location: [facility];  Service: Orthopedics;  Laterality: N/A;   Social History:  reports that she has never smoked. She has never used smokeless tobacco. She reports that she does not drink alcohol and does not use drugs.  Allergies  Allergen Reactions   Codeine Nausea And Vomiting   Darvon [Propoxyphene] Other (See Comments)    Altered mental status     Demerol [Meperidine] Other (See Comments)    Altered mental status     Gabapentin Other (See  Comments)   Sulfa Antibiotics Other (See Comments)   Tramadol Other (See Comments)    "makes me crazy"  "makes me crazy"    Voltaren [Diclofenac Sodium] Other (See Comments)    "Makes me crazy" "Makes me crazy"   Zinc     Hurting in lower stomach     Family History  Problem Relation Age of Onset   Stroke Mother    Breast cancer Sister 66    Prior to Admission medications   Medication Sig Start Date End Date Taking? Authorizing Provider  acetaminophen (TYLENOL) 500 MG tablet Take 2 tablets (1,000 mg total) by mouth every 8 (eight) hours as needed. 03/25/22  Yes Lynn Ito, MD  atorvastatin (LIPITOR) 20 MG tablet Take 20 mg by mouth daily.   Yes [provider]  clopidogrel (PLAVIX) 75 MG tablet Take 75 mg by mouth daily.  11/12/15  Yes [provider]  pantoprazole (PROTONIX) 40 MG tablet Take 1 tablet by mouth 2 (two) times daily. 02/13/23  Yes [provider]  Calcium Carb-Cholecalciferol (CALCIUM 600 + D PO) Take 1 tablet by mouth daily.    [provider]  cetirizine (ZYRTEC) 10 MG tablet Take 10 mg by mouth daily. 07/18/16   [provider]  chlorhexidine (PERIDEX) 0.12 % solution Use as directed 15 mLs in the mouth or throat 2 (two) times daily. 07/26/22   Shaune Pollack, MD  fluticasone (FLONASE) 50 MCG/ACT nasal spray Place 2 sprays into both nostrils daily as needed for allergies.  11/12/15   [provider]  levothyroxine (SYNTHROID, LEVOTHROID) 25 MCG tablet Take 25 mcg by mouth daily before breakfast.  12/10/16   [provider]  metoprolol succinate (TOPROL-XL) 50 MG 24 hr tablet Take 75 mg by mouth every morning.     [provider]  montelukast (SINGULAIR) 10 MG tablet Take 10 mg by mouth at bedtime.  07/18/16   [provider]  Multiple Vitamin (MULTIVITAMIN WITH MINERALS) TABS tablet Take 1 tablet by mouth daily.    [provider]  NIFEdipine (PROCARDIA-XL/NIFEDICAL-XL) 30 MG 24 hr  tablet Take 30 mg by mouth daily. 11/06/18   [provider]    Physical Exam: Vitals:   05/06/23 1935 05/06/23 1939 05/06/23 1945 05/06/23 2030  BP: 132/69 131/70  (!) 140/81  Pulse: 72 74 70 75  Resp: 18 13 11 20   Temp: 97.8 F (36.6 C)     TempSrc: Oral     SpO2: 99% 98% 100% 97%  Weight:  55.9 kg    Height:  5\' 1"  (1.549 m)     Physical Exam Vitals and nursing note reviewed.  Constitutional:      General: She is not in acute distress.    Comments: Frail-appearing elderly female in no distress  HENT:     Head: Normocephalic and atraumatic.  Cardiovascular:     Rate and Rhythm: Normal rate and regular rhythm.     Heart sounds: Normal heart sounds.  Pulmonary:     Effort:  Pulmonary effort is normal.     Breath sounds: Normal breath sounds.  Abdominal:     Palpations: Abdomen is soft.     Tenderness: There is no abdominal tenderness.  Neurological:     Mental Status: Mental status is at baseline.     Labs on Admission: I have personally reviewed following labs and imaging studies  CBC: Recent Labs  Lab 05/06/23 2027  WBC 6.9  NEUTROABS 4.5  HGB 13.8  HCT 39.7  MCV 93.4  PLT 140*   Basic Metabolic Panel: Recent Labs  Lab 05/06/23 2027  NA 135  K 4.0  CL 106  CO2 21*  GLUCOSE 160*  BUN 17  CREATININE 0.62  CALCIUM 8.3*   GFR: Estimated Creatinine Clearance: 34.6 mL/min (by C-G formula based on SCr of 0.62 mg/dL). Liver Function Tests: No results for input(s): "AST", "ALT", "ALKPHOS", "BILITOT", "PROT", "ALBUMIN" in the last 168 hours. No results for input(s): "LIPASE", "AMYLASE" in the last 168 hours. No results for input(s): "AMMONIA" in the last 168 hours. Coagulation Profile: No results for input(s): "INR", "PROTIME" in the last 168 hours. Cardiac Enzymes: No results for input(s): "CKTOTAL", "CKMB", "CKMBINDEX", "TROPONINI" in the last 168 hours. BNP (last 3 results) No results for input(s): "PROBNP" in the last 8760  hours. HbA1C: No results for input(s): "HGBA1C" in the last 72 hours. CBG: No results for input(s): "GLUCAP" in the last 168 hours. Lipid Profile: No results for input(s): "CHOL", "HDL", "LDLCALC", "TRIG", "CHOLHDL", "LDLDIRECT" in the last 72 hours. Thyroid Function Tests: No results for input(s): "TSH", "T4TOTAL", "FREET4", "T3FREE", "THYROIDAB" in the last 72 hours. Anemia Panel: No results for input(s): "VITAMINB12", "FOLATE", "FERRITIN", "TIBC", "IRON", "RETICCTPCT" in the last 72 hours. Urine analysis:    Component Value Date/Time   COLORURINE YELLOW (A) 05/06/2023 2027   APPEARANCEUR CLEAR (A) 05/06/2023 2027   APPEARANCEUR Clear 04/01/2014 0246   LABSPEC 1.009 05/06/2023 2027   LABSPEC 1.006 04/01/2014 0246   PHURINE 6.0 05/06/2023 2027   GLUCOSEU 50 (A) 05/06/2023 2027   GLUCOSEU >=500 04/01/2014 0246   HGBUR NEGATIVE 05/06/2023 2027   BILIRUBINUR NEGATIVE 05/06/2023 2027   BILIRUBINUR Negative 04/01/2014 0246   KETONESUR NEGATIVE 05/06/2023 2027   PROTEINUR NEGATIVE 05/06/2023 2027   NITRITE NEGATIVE 05/06/2023 2027   LEUKOCYTESUR MODERATE (A) 05/06/2023 2027   LEUKOCYTESUR Negative 04/01/2014 0246    Radiological Exams on Admission: CT Hip Left Wo Contrast  Result Date: 05/06/2023 CLINICAL DATA:  Hip fracture EXAM: CT OF THE LEFT HIP WITHOUT CONTRAST TECHNIQUE: Multidetector CT imaging of the left hip was performed according to the standard protocol. Multiplanar CT image reconstructions were also generated. RADIATION DOSE REDUCTION: This exam was performed according to the departmental dose-optimization program which includes automated exposure control, adjustment of the mA and/or kV according to patient size and/or use of iterative reconstruction technique. COMPARISON:  Left hip x-ray same day FINDINGS: Bones/Joint/Cartilage The bones are diffusely osteopenic. There is an acute mildly impacted comminuted intratrochanteric fracture of the left femur. There is mild apex  anterior angulation. There is no dislocation. No focal osseous lesion identified. Ligaments Suboptimally assessed by CT. Muscles and Tendons There is intramuscular edema surrounding the fracture site. No definitive hematoma. Soft tissues Colonic diverticulosis present. No focal hematoma or fluid collection. IMPRESSION: Acute mildly impacted comminuted intratrochanteric fracture of the left femur. Electronically Signed   By: Darliss Cheney M.D.   On: 05/06/2023 23:04   DG Chest Portable 1 View  Result Date: 05/06/2023 CLINICAL DATA:  Ground level fall and left hip pain.  Preop. EXAM: PORTABLE CHEST 1 VIEW COMPARISON:  07/26/2022 FINDINGS: Stable cardiomediastinal silhouette. Aortic atherosclerotic calcification. Bibasilar atelectasis/scarring. No focal consolidation, pleural effusion, or pneumothorax. No displaced rib fractures. Moderate hiatal hernia. IMPRESSION: No acute cardiopulmonary disease. Moderate hiatal hernia. Electronically Signed   By: Minerva Fester M.D.   On: 05/06/2023 22:33   DG Hip Unilat W or Wo Pelvis 2-3 Views Left  Result Date: 05/06/2023 CLINICAL DATA:  Fall, left hip pain EXAM: DG HIP (WITH OR WITHOUT PELVIS) 2-3V LEFT COMPARISON:  None Available. FINDINGS: The osseous structures are diffusely markedly osteopenic. There is an acute, impacted basicervical/intratrochanteric fracture of the left hip with probable cortical avulsion of the lesser trochanter. Femoral head is still seated within the left acetabulum. Mild bilateral degenerative hip arthritis. Degenerative changes are seen within the sacroiliac joints. No other fracture identified. L4 vertebroplasty has been performed. IMPRESSION: 1. Acute, impacted basicervical/intratrochanteric fracture of the left hip. Electronically Signed   By: Helyn Numbers M.D.   On: 05/06/2023 21:34     Data Reviewed: Relevant notes from primary care and specialist visits, past discharge summaries as available in EHR, including Care  Everywhere. Prior diagnostic testing as pertinent to current admission diagnoses Updated medications and problem lists for reconciliation ED course, including vitals, labs, imaging, treatment and response to treatment Triage notes, nursing and pharmacy notes and ED provider's notes Notable results as noted in HPI   Assessment and Plan: * Closed comminuted intertrochanteric fracture of proximal end of left femur, initial encounter (HCC) Accidental fall Preoperative clearance Pain control N.p.o. from midnight Patient has a history of CAD and has been symptom-free.  Last saw her cardiologist in February 2024 and was given a 16-month follow-up.  Also has a history of prior TIA maintained on Plavix and atorvastatin Recent diagnosis of COVID just over a week ago but symptomatic only for weakness that has been improving At low risk of perioperative cardiopulmonary events Will hold Plavix for surgery Further orders per orthopedist, Dr. Allena Katz   COVID-19 virus infection COVID precautions.  Diagnosed a week ago.  Was symptomatic for mild weakness  Type 2 diabetes mellitus without complication (HCC) Sliding scale insulin coverage  History of TIA (transient ischemic attack) Holding Plavix for surgery Continue atorvastatin  Benign essential hypertension Blood pressure controlled Continue metoprolol  Coronary artery disease Patient with no anginal pain.  EKG nonacute Continue atorvastatin and metoprolol Holding Plavix for surgery  Acquired hypothyroidism Continue levothyroxine     DVT prophylaxis: SCD  Consults: Ortho, Dr. Signa Kell  Advance Care Planning:   Code Status: Prior   Family Communication: friend at bedside  Disposition Plan: Back to previous home environment  Severity of Illness: The appropriate patient status for this patient is INPATIENT. Inpatient status is judged to be reasonable and necessary in order to provide the required intensity of service to ensure  the patient's safety. The patient's presenting symptoms, physical exam findings, and initial radiographic and laboratory data in the context of their chronic comorbidities is felt to place them at high risk for further clinical deterioration. Furthermore, it is not anticipated that the patient will be medically stable for discharge from the hospital within 2 midnights of admission.   * I certify that at the point of admission it is my clinical judgment that the patient will require inpatient hospital care spanning beyond 2 midnights from the point of admission due to high intensity of service, high risk for further deterioration and high  frequency of surveillance required.*  Author: Andris Baumann, MD 05/06/2023 11:27 PM  For on call review [email or web address].

## 2023-05-06 NOTE — Assessment & Plan Note (Addendum)
Accidental fall Preoperative clearance Pain control N.p.o. from midnight Patient has a history of CAD and has been symptom-free.  Last saw her cardiologist in February 2024 and was given a 63-month follow-up.  Also has a history of prior TIA maintained on Plavix and atorvastatin Recent diagnosis of COVID just over a week ago but symptomatic only for weakness that has been improving At low risk of perioperative cardiopulmonary events Will hold Plavix for surgery Further orders per orthopedist, Dr. Allena Katz

## 2023-05-06 NOTE — Assessment & Plan Note (Signed)
Holding Plavix for surgery Continue atorvastatin

## 2023-05-06 NOTE — Assessment & Plan Note (Signed)
COVID precautions.  Diagnosed a week ago.  Was symptomatic for mild weakness

## 2023-05-06 NOTE — Assessment & Plan Note (Signed)
Sliding scale insulin coverage 

## 2023-05-06 NOTE — Assessment & Plan Note (Signed)
Blood pressure controlled.  Continue metoprolol. 

## 2023-05-06 NOTE — Assessment & Plan Note (Signed)
Continue levothyroxine 

## 2023-05-06 NOTE — ED Triage Notes (Signed)
Pt presents to the ED via EMS c/o a ground level fall and L hip pain. Pt states she was outside watering her flowers when she tried to turn and just lost her balance. Pt denies hitting her head or LOC. Pt states she takes Plavix.

## 2023-05-06 NOTE — ED Provider Notes (Signed)
[facility] Provider Note    Event Date/Time   First MD Initiated Contact with Patient 05/06/23 1949     (approximate)   History   Fall   HPI  Rachael Humphrey is a 89 y.o. female who presents to the emergency department today with concerns for left hip and groin pain after a fall.  The patient had been out in her garden.  She states that she tried to turn around however her feet did not follow her.  She did land on her left side.  She states she did not hit her head.  She also hit her elbow but denies any significant pain in the elbow.     Physical Exam   Triage Vital Signs: ED Triage Vitals  Encounter Vitals Group     BP 05/06/23 1935 132/69     Systolic BP Percentile --      Diastolic BP Percentile --      Pulse Rate 05/06/23 1935 72     Resp 05/06/23 1935 18     Temp 05/06/23 1935 97.8 F (36.6 C)     Temp Source 05/06/23 1935 Oral     SpO2 05/06/23 1933 95 %     Weight 05/06/23 1939 123 lb 3.8 oz (55.9 kg)     Height 05/06/23 1939 5\' 1"  (1.549 m)     Head Circumference --      Peak Flow --      Pain Score 05/06/23 1939 9     Pain Loc --      Pain Education --      Exclude from Growth Chart --     Most recent vital signs: Vitals:   05/06/23 1939 05/06/23 1945  BP: 131/70   Pulse: 74 70  Resp: 13 11  Temp:    SpO2: 98% 100%   General: Awake, alert, oriented. CV:  Good peripheral perfusion. Regular rate and rhythm. Resp:  Normal effort. Lungs clear. Abd:  No distention.  Ext:  Tenderness to manipulation of the left hip. NV intact distally. Skin tear to left elbow without deformity.  ED Results / Procedures / Treatments   Labs (all labs ordered are listed, but only abnormal results are displayed) Labs Reviewed - No data to display   EKG  I, Phineas Semen, attending physician, personally viewed and interpreted this EKG  EKG Time: 1938 Rate: 71 Rhythm: sinus rhythm Axis: normal Intervals: qtc 459 QRS: narrow ST  changes: no st elevation Impression: normal ekg    RADIOLOGY *** {USE THE WORD "INTERPRETED"!! You MUST document your own interpretation of imaging, as well as the fact that you reviewed the radiologist's report!:1}   PROCEDURES:  Critical Care performed: Yes  CRITICAL CARE Performed by: Phineas Semen   Total critical care time: *** minutes  Critical care time was exclusive of separately billable procedures and treating other patients.  Critical care was necessary to treat or prevent imminent or life-threatening deterioration.  Critical care was time spent personally by me on the following activities: development of treatment plan with patient and/or surrogate as well as nursing, discussions with consultants, evaluation of patient's response to treatment, examination of patient, obtaining history from patient or surrogate, ordering and performing treatments and interventions, ordering and review of laboratory studies, ordering and review of radiographic studies, pulse oximetry and re-evaluation of patient's condition.   Procedures    MEDICATIONS ORDERED IN ED: Medications - No data to display   IMPRESSION / MDM / ASSESSMENT  AND PLAN / ED COURSE  I reviewed the triage vital signs and the nursing notes.                              Differential diagnosis includes, but is not limited to, ***  Patient's presentation is most consistent with {EM COPA:27473}   ***The patient is on the cardiac monitor to evaluate for evidence of arrhythmia and/or significant heart rate changes.  ***      FINAL CLINICAL IMPRESSION(S) / ED DIAGNOSES   Final diagnoses:  None     Rx / DC Orders   ED Discharge Orders     None        Note:  This document was prepared using Dragon voice recognition software and may include unintentional dictation errors.

## 2023-05-07 ENCOUNTER — Inpatient Hospital Stay: Payer: Medicare Other

## 2023-05-07 ENCOUNTER — Inpatient Hospital Stay: Payer: Medicare Other | Admitting: Anesthesiology

## 2023-05-07 ENCOUNTER — Encounter: Payer: Self-pay | Admitting: Internal Medicine

## 2023-05-07 ENCOUNTER — Encounter
Admission: EM | Disposition: A | Discharge status: Skilled Nursing Facility | Payer: Self-pay | Source: Home / Self Care | Attending: Osteopathic Medicine

## 2023-05-07 DIAGNOSIS — S72142A Displaced intertrochanteric fracture of left femur, initial encounter for closed fracture: Secondary | ICD-10-CM | POA: Diagnosis not present

## 2023-05-07 HISTORY — PX: INTRAMEDULLARY (IM) NAIL INTERTROCHANTERIC: SHX5875

## 2023-05-07 LAB — TYPE AND SCREEN
ABO/RH(D): A POS
Antibody Screen: NEGATIVE

## 2023-05-07 LAB — HEMOGLOBIN A1C
Hgb A1c MFr Bld: 6.6 % — ABNORMAL HIGH (ref 4.8–5.6)
Mean Plasma Glucose: 142.72 mg/dL

## 2023-05-07 LAB — CBG MONITORING, ED
Glucose-Capillary: 133 mg/dL — ABNORMAL HIGH (ref 70–99)
Glucose-Capillary: 146 mg/dL — ABNORMAL HIGH (ref 70–99)
Glucose-Capillary: 151 mg/dL — ABNORMAL HIGH (ref 70–99)
Glucose-Capillary: 187 mg/dL — ABNORMAL HIGH (ref 70–99)

## 2023-05-07 LAB — GLUCOSE, CAPILLARY
Glucose-Capillary: 215 mg/dL — ABNORMAL HIGH (ref 70–99)
Glucose-Capillary: 222 mg/dL — ABNORMAL HIGH (ref 70–99)

## 2023-05-07 SURGERY — FIXATION, FRACTURE, INTERTROCHANTERIC, WITH INTRAMEDULLARY ROD
Anesthesia: General | Laterality: Left

## 2023-05-07 MED ORDER — FENTANYL CITRATE (PF) 100 MCG/2ML IJ SOLN
INTRAMUSCULAR | Status: DC | PRN
  Administered 2023-05-07 (×2): 50 ug via INTRAVENOUS

## 2023-05-07 MED ORDER — SENNOSIDES-DOCUSATE SODIUM 8.6-50 MG PO TABS: 1.0000 | ORAL_TABLET | Freq: Every evening | ORAL | Status: DC | PRN

## 2023-05-07 MED ORDER — MONTELUKAST SODIUM 10 MG PO TABS
10.0000 mg | ORAL_TABLET | Freq: Every day | ORAL | Status: DC
  Administered 2023-05-07 – 2023-05-11 (×5): 10 mg via ORAL
  Filled 2023-05-07 (×5): qty 1

## 2023-05-07 MED ORDER — PANTOPRAZOLE SODIUM 40 MG PO TBEC
40.0000 mg | DELAYED_RELEASE_TABLET | Freq: Two times a day (BID) | ORAL | Status: DC
  Administered 2023-05-07 – 2023-05-12 (×10): 40 mg via ORAL
  Filled 2023-05-07 (×10): qty 1

## 2023-05-07 MED ORDER — LORATADINE 10 MG PO TABS
10.0000 mg | ORAL_TABLET | Freq: Every day | ORAL | Status: DC
  Administered 2023-05-07 – 2023-05-12 (×6): 10 mg via ORAL
  Filled 2023-05-07 (×6): qty 1

## 2023-05-07 MED ORDER — FLUTICASONE PROPIONATE 50 MCG/ACT NA SUSP: 2.0000 | Freq: Every day | NASAL | Status: DC | PRN

## 2023-05-07 MED ORDER — HYDROMORPHONE HCL 1 MG/ML IJ SOLN: 0.2000 mg | INTRAMUSCULAR | Status: DC | PRN

## 2023-05-07 MED ORDER — 0.9 % SODIUM CHLORIDE (POUR BTL) OPTIME
TOPICAL | Status: DC | PRN
  Administered 2023-05-07: 250 mL

## 2023-05-07 MED ORDER — ADULT MULTIVITAMIN W/MINERALS CH: 1.0000 | ORAL_TABLET | Freq: Every day | ORAL | Status: DC

## 2023-05-07 MED ORDER — BUPIVACAINE HCL (PF) 0.5 % IJ SOLN
INTRAMUSCULAR | Status: AC
  Filled 2023-05-07: qty 30

## 2023-05-07 MED ORDER — ONDANSETRON HCL 4 MG PO TABS: 4.0000 mg | ORAL_TABLET | Freq: Four times a day (QID) | ORAL | Status: DC | PRN

## 2023-05-07 MED ORDER — SODIUM CHLORIDE 0.9 % IV SOLN: INTRAVENOUS | Status: DC

## 2023-05-07 MED ORDER — PHENYLEPHRINE HCL (PRESSORS) 10 MG/ML IV SOLN
INTRAVENOUS | Status: DC | PRN
  Administered 2023-05-07 (×4): 100 ug via INTRAVENOUS

## 2023-05-07 MED ORDER — OXYCODONE HCL 5 MG/5ML PO SOLN: 5.0000 mg | Freq: Once | ORAL | Status: DC | PRN

## 2023-05-07 MED ORDER — FENTANYL CITRATE (PF) 100 MCG/2ML IJ SOLN
INTRAMUSCULAR | Status: AC
  Filled 2023-05-07: qty 2

## 2023-05-07 MED ORDER — CLOPIDOGREL BISULFATE 75 MG PO TABS
75.0000 mg | ORAL_TABLET | Freq: Every day | ORAL | Status: DC
  Administered 2023-05-08 – 2023-05-12 (×5): 75 mg via ORAL
  Filled 2023-05-07 (×5): qty 1

## 2023-05-07 MED ORDER — SUGAMMADEX SODIUM 200 MG/2ML IV SOLN
INTRAVENOUS | Status: DC | PRN
  Administered 2023-05-07: 200 mg via INTRAVENOUS

## 2023-05-07 MED ORDER — DOCUSATE SODIUM 100 MG PO CAPS
100.0000 mg | ORAL_CAPSULE | Freq: Two times a day (BID) | ORAL | Status: DC
  Administered 2023-05-07 – 2023-05-12 (×9): 100 mg via ORAL
  Filled 2023-05-07 (×9): qty 1

## 2023-05-07 MED ORDER — OXYCODONE HCL 5 MG PO TABS: 5.0000 mg | ORAL_TABLET | Freq: Once | ORAL | Status: DC | PRN

## 2023-05-07 MED ORDER — OXYCODONE HCL 5 MG PO TABS
5.0000 mg | ORAL_TABLET | ORAL | Status: DC | PRN
  Administered 2023-05-09: 5 mg via ORAL
  Administered 2023-05-11: 10 mg via ORAL
  Filled 2023-05-07: qty 2
  Filled 2023-05-07: qty 1

## 2023-05-07 MED ORDER — FLEET ENEMA RE ENEM: 1.0000 | ENEMA | Freq: Once | RECTAL | Status: DC | PRN

## 2023-05-07 MED ORDER — LIDOCAINE HCL (CARDIAC) PF 100 MG/5ML IV SOSY
PREFILLED_SYRINGE | INTRAVENOUS | Status: DC | PRN
  Administered 2023-05-07: 40 mg via INTRAVENOUS

## 2023-05-07 MED ORDER — CEFAZOLIN SODIUM 1 G IJ SOLR
INTRAMUSCULAR | Status: AC
  Filled 2023-05-07: qty 20

## 2023-05-07 MED ORDER — CEFAZOLIN SODIUM-DEXTROSE 1-4 GM/50ML-% IV SOLN
INTRAVENOUS | Status: DC | PRN
  Administered 2023-05-07: 1 g via INTRAVENOUS

## 2023-05-07 MED ORDER — BISACODYL 10 MG RE SUPP: 10.0000 mg | Freq: Every day | RECTAL | Status: DC | PRN

## 2023-05-07 MED ORDER — LIDOCAINE HCL (PF) 2 % IJ SOLN
INTRAMUSCULAR | Status: AC
  Filled 2023-05-07: qty 5

## 2023-05-07 MED ORDER — ONDANSETRON HCL 4 MG/2ML IJ SOLN
INTRAMUSCULAR | Status: DC | PRN
  Administered 2023-05-07: 4 mg via INTRAVENOUS

## 2023-05-07 MED ORDER — PROPOFOL 10 MG/ML IV BOLUS
INTRAVENOUS | Status: DC | PRN
  Administered 2023-05-07: 80 mg via INTRAVENOUS

## 2023-05-07 MED ORDER — METOCLOPRAMIDE HCL 5 MG/ML IJ SOLN: 5.0000 mg | Freq: Three times a day (TID) | INTRAMUSCULAR | Status: DC | PRN

## 2023-05-07 MED ORDER — OXYCODONE HCL 5 MG PO TABS
2.5000 mg | ORAL_TABLET | ORAL | Status: DC | PRN
  Administered 2023-05-09 – 2023-05-11 (×2): 5 mg via ORAL
  Filled 2023-05-07 (×3): qty 1

## 2023-05-07 MED ORDER — BUPIVACAINE LIPOSOME 1.3 % IJ SUSP
INTRAMUSCULAR | Status: AC
  Filled 2023-05-07: qty 20

## 2023-05-07 MED ORDER — ENSURE ENLIVE PO LIQD
237.0000 mL | Freq: Two times a day (BID) | ORAL | Status: DC
  Administered 2023-05-08 – 2023-05-12 (×7): 237 mL via ORAL
  Filled 2023-05-07 (×2): qty 237

## 2023-05-07 MED ORDER — ONDANSETRON HCL 4 MG/2ML IJ SOLN
INTRAMUSCULAR | Status: AC
  Filled 2023-05-07: qty 2

## 2023-05-07 MED ORDER — DEXAMETHASONE SODIUM PHOSPHATE 10 MG/ML IJ SOLN
INTRAMUSCULAR | Status: DC | PRN
  Administered 2023-05-07: 5 mg via INTRAVENOUS

## 2023-05-07 MED ORDER — METOCLOPRAMIDE HCL 5 MG PO TABS: 5.0000 mg | ORAL_TABLET | Freq: Three times a day (TID) | ORAL | Status: DC | PRN

## 2023-05-07 MED ORDER — CEFAZOLIN SODIUM-DEXTROSE 2-4 GM/100ML-% IV SOLN
2.0000 g | Freq: Four times a day (QID) | INTRAVENOUS | Status: AC
  Administered 2023-05-07 – 2023-05-08 (×3): 2 g via INTRAVENOUS
  Filled 2023-05-07 (×3): qty 100

## 2023-05-07 MED ORDER — EPHEDRINE SULFATE (PRESSORS) 50 MG/ML IJ SOLN
INTRAMUSCULAR | Status: DC | PRN
  Administered 2023-05-07 (×3): 5 mg via INTRAVENOUS

## 2023-05-07 MED ORDER — BUPIVACAINE LIPOSOME 1.3 % IJ SUSP
INTRAMUSCULAR | Status: DC | PRN
  Administered 2023-05-07: 20 mL

## 2023-05-07 MED ORDER — ACETAMINOPHEN 500 MG PO TABS
1000.0000 mg | ORAL_TABLET | Freq: Three times a day (TID) | ORAL | Status: DC
  Administered 2023-05-07 – 2023-05-12 (×14): 1000 mg via ORAL
  Filled 2023-05-07 (×15): qty 2

## 2023-05-07 MED ORDER — PROPOFOL 10 MG/ML IV BOLUS
INTRAVENOUS | Status: AC
  Filled 2023-05-07: qty 20

## 2023-05-07 MED ORDER — DEXAMETHASONE SODIUM PHOSPHATE 10 MG/ML IJ SOLN
INTRAMUSCULAR | Status: AC
  Filled 2023-05-07: qty 1

## 2023-05-07 MED ORDER — FENTANYL CITRATE (PF) 100 MCG/2ML IJ SOLN: 25.0000 ug | INTRAMUSCULAR | Status: DC | PRN

## 2023-05-07 MED ORDER — LACTATED RINGERS IV SOLN: INTRAVENOUS | Status: DC | PRN

## 2023-05-07 MED ORDER — NIFEDIPINE ER OSMOTIC RELEASE 30 MG PO TB24
30.0000 mg | ORAL_TABLET | Freq: Every day | ORAL | Status: DC
  Administered 2023-05-07 – 2023-05-12 (×6): 30 mg via ORAL
  Filled 2023-05-07 (×6): qty 1

## 2023-05-07 MED ORDER — ROCURONIUM BROMIDE 100 MG/10ML IV SOLN
INTRAVENOUS | Status: DC | PRN
  Administered 2023-05-07: 40 mg via INTRAVENOUS

## 2023-05-07 MED ORDER — OYSTER SHELL CALCIUM/D3 500-5 MG-MCG PO TABS
1.0000 | ORAL_TABLET | Freq: Every day | ORAL | Status: DC
  Administered 2023-05-08 – 2023-05-12 (×5): 1 via ORAL
  Filled 2023-05-07 (×5): qty 1

## 2023-05-07 MED ORDER — FENTANYL CITRATE (PF) 100 MCG/2ML IJ SOLN
25.0000 ug | INTRAMUSCULAR | Status: DC | PRN
  Administered 2023-05-07: 25 ug via INTRAVENOUS

## 2023-05-07 MED ORDER — ADULT MULTIVITAMIN W/MINERALS CH
1.0000 | ORAL_TABLET | Freq: Every day | ORAL | Status: DC
  Administered 2023-05-08 – 2023-05-12 (×5): 1 via ORAL
  Filled 2023-05-07 (×5): qty 1

## 2023-05-07 MED ORDER — BUPIVACAINE HCL (PF) 0.5 % IJ SOLN
INTRAMUSCULAR | Status: DC | PRN
  Administered 2023-05-07: 30 mL

## 2023-05-07 MED ORDER — ASPIRIN 325 MG PO TBEC
325.0000 mg | DELAYED_RELEASE_TABLET | Freq: Every day | ORAL | Status: DC
  Administered 2023-05-08 – 2023-05-12 (×5): 325 mg via ORAL
  Filled 2023-05-07 (×5): qty 1

## 2023-05-07 MED ORDER — ONDANSETRON HCL 4 MG/2ML IJ SOLN: 4.0000 mg | Freq: Four times a day (QID) | INTRAMUSCULAR | Status: DC | PRN

## 2023-05-07 SURGICAL SUPPLY — 43 items
APL PRP STRL LF DISP 70% ISPRP (MISCELLANEOUS) ×1
BIT DRILL INTERTAN LAG SCREW (BIT) IMPLANT
BIT DRILL LONG 4.0 (BIT) IMPLANT
BLADE SURG 15 STRL LF DISP TIS (BLADE) ×1 IMPLANT
BLADE SURG 15 STRL SS (BLADE) ×1
CHLORAPREP W/TINT 26 (MISCELLANEOUS) ×1 IMPLANT
DRAPE SHEET LG 3/4 BI-LAMINATE (DRAPES) ×1 IMPLANT
DRAPE SURG 17X11 SM STRL (DRAPES) ×2 IMPLANT
DRAPE U-SHAPE 47X51 STRL (DRAPES) ×2 IMPLANT
DRILL BIT LONG 4.0 (BIT) ×1
DRSG OPSITE POSTOP 3X4 (GAUZE/BANDAGES/DRESSINGS) ×3 IMPLANT
DRSG OPSITE POSTOP 4X6 (GAUZE/BANDAGES/DRESSINGS) IMPLANT
ELECT REM PT RETURN 9FT ADLT (ELECTROSURGICAL) ×1
ELECTRODE REM PT RTRN 9FT ADLT (ELECTROSURGICAL) ×1 IMPLANT
GAUZE XEROFORM 4X4 STRL (GAUZE/BANDAGES/DRESSINGS) ×1 IMPLANT
GLOVE BIOGEL PI IND STRL 8 (GLOVE) ×1 IMPLANT
GLOVE SURG SYN 7.5 E (GLOVE) ×1 IMPLANT
GLOVE SURG SYN 7.5 PF PI (GLOVE) ×1 IMPLANT
GOWN STRL REUS W/ TWL LRG LVL3 (GOWN DISPOSABLE) ×1 IMPLANT
GOWN STRL REUS W/ TWL XL LVL3 (GOWN DISPOSABLE) ×1 IMPLANT
GOWN STRL REUS W/TWL LRG LVL3 (GOWN DISPOSABLE) ×1
GOWN STRL REUS W/TWL XL LVL3 (GOWN DISPOSABLE) ×1
GUIDE PIN 3.2X343 (PIN) ×2
GUIDE PIN 3.2X343MM (PIN) ×2
KIT PATIENT CARE HANA TABLE (KITS) ×1 IMPLANT
KIT TURNOVER CYSTO (KITS) ×1 IMPLANT
MANIFOLD NEPTUNE II (INSTRUMENTS) ×1 IMPLANT
MAT ABSORB FLUID 56X50 GRAY (MISCELLANEOUS) ×2 IMPLANT
NAIL TRIGEN INTERTAN 10X18CM (Nail) IMPLANT
NDL HYPO 22X1.5 SAFETY MO (MISCELLANEOUS) ×1 IMPLANT
NEEDLE HYPO 22X1.5 SAFETY MO (MISCELLANEOUS) ×1 IMPLANT
NS IRRIG 500ML POUR BTL (IV SOLUTION) ×1 IMPLANT
PACK HIP COMPR (MISCELLANEOUS) ×1 IMPLANT
PIN GUIDE 3.2X343MM (PIN) IMPLANT
SCREW LAG COMPR KIT 90/85 (Screw) IMPLANT
SCREW TRIGEN LOW PROF 5.0X30 (Screw) IMPLANT
STAPLER SKIN PROX 35W (STAPLE) ×1 IMPLANT
SUT VIC AB 2-0 CT2 27 (SUTURE) ×1 IMPLANT
SYR 10ML LL (SYRINGE) ×1 IMPLANT
SYR 30ML LL (SYRINGE) ×1 IMPLANT
TAPE CLOTH 3X10 WHT NS LF (GAUZE/BANDAGES/DRESSINGS) ×2 IMPLANT
TRAP FLUID SMOKE EVACUATOR (MISCELLANEOUS) ×1 IMPLANT
WATER STERILE IRR 500ML POUR (IV SOLUTION) ×1 IMPLANT

## 2023-05-07 NOTE — Anesthesia Preprocedure Evaluation (Signed)
Anesthesia Evaluation  Patient identified by MRN, date of birth, ID band Patient awake    Reviewed: Allergy & Precautions, NPO status , Patient's Chart, lab work & pertinent test results  History of Anesthesia Complications (+) PONV and history of anesthetic complications  Airway Mallampati: III  TM Distance: <3 FB Neck ROM: full    Dental  (+) Poor Dentition, Caps, Chipped, Missing   Pulmonary neg pulmonary ROS, neg shortness of breath   Pulmonary exam normal        Cardiovascular hypertension, + CAD and + Past MI  Normal cardiovascular exam     Neuro/Psych TIA Neuromuscular disease  negative psych ROS   GI/Hepatic Neg liver ROS, hiatal hernia,GERD  Controlled,,  Endo/Other  diabetesHypothyroidism    Renal/GU Renal disease     Musculoskeletal   Abdominal   Peds  Hematology negative hematology ROS (+)   Anesthesia Other Findings Patient has medical clearance for this procedure.   Past Medical History: 2005: Adenocarcinoma, colon (HCC) No date: Biatrial enlargement No date: Chronic anticoagulation     Comment:  Clopidogrel No date: Complication of anesthesia No date: Coronary artery disease     Comment:  a.) 2.75 x 28 mm Vision BMS to pLAD on 02/23/2014 No date: GERD (gastroesophageal reflux disease) No date: Grade II diastolic dysfunction No date: H/O hiatal hernia No date: Hemorrhoids     Comment:  internal-not a problem now. No date: History of kidney stones     Comment:  multiple 2020: History of methicillin resistant staphylococcus aureus (MRSA) 04/18/2014: History of shingles No date: HLD (hyperlipidemia) No date: Hypertension No date: Hypothyroidism No date: Myocardial infarction (HCC) No date: PONV (postoperative nausea and vomiting) No date: Senile osteoporosis No date: T2DM (type 2 diabetes mellitus) (HCC) No date: TIA (transient ischemic attack) No date: Valvular regurgitation      Comment:  panvalvular  Past Surgical History: No date: APPENDECTOMY 1978: AUGMENTATION MAMMAPLASTY; Bilateral No date: BREAST BIOPSY; Right     Comment:  over 30 years ago negative No date: BREAST BIOPSY; Left     Comment:  negative over 30 years ago No date: CATARACT EXTRACTION, BILATERAL No date: COLECTOMY     Comment:  partial small and large instestine removed 02/23/2014: CORONARY ANGIOPLASTY WITH STENT PLACEMENT; N/A     Comment:  2.75 x 28 mm Vision BMS x 1 to pLAD 05/17/2014: CYSTOSCOPY WITH RETROGRADE PYELOGRAM, URETEROSCOPY AND  STENT PLACEMENT; Bilateral     Comment:  Procedure: CYSTOSCOPY WITH RETROGRADE PYELOGRAM,               URETEROSCOPY AND STENT PLACEMENT, BASKET STONE REMOVAL;                Surgeon: Sebastian Ache, MD;  Location: [facility];  Service:              Urology;  Laterality: Bilateral; 11/25/2018: KYPHOPLASTY; N/A     Comment:  Procedure: KYPHOPLASTY T11, DIABETIC;  Surgeon: Kennedy Bucker, MD;  Location: [facility];  Service: Orthopedics;               Laterality: N/A; 11/28/2020: KYPHOPLASTY; N/A     Comment:  Procedure: [id number]  USE VASCULAR BED;  Surgeon:               Kennedy Bucker, MD;  Location: [facility];  Service:  Orthopedics;  Laterality: N/A; 04/25/2021: KYPHOPLASTY; N/A     Comment:  Procedure: L 3 KYPHOPLASTY;  Surgeon: Kennedy Bucker, MD;              Location: [facility];  Service: Orthopedics;  Laterality:               N/A;  BMI    Body Mass Index: 23.29 kg/m      Reproductive/Obstetrics negative OB ROS                              Anesthesia Physical Anesthesia Plan  ASA: 3  Anesthesia Plan: General ETT   Post-op Pain Management:    Induction: Intravenous  PONV Risk Score and Plan: Ondansetron, Dexamethasone, Midazolam and Treatment may vary due to age or medical condition  Airway Management Planned: Oral ETT  Additional Equipment:   Intra-op Plan:    Post-operative Plan: Extubation in OR  Informed Consent: I have reviewed the patients History and Physical, chart, labs and discussed the procedure including the risks, benefits and alternatives for the proposed anesthesia with the patient or authorized representative who has indicated his/her understanding and acceptance.   Patient has DNR.  Discussed DNR with patient and Suspend DNR.   Dental Advisory Given  Plan Discussed with: Anesthesiologist, CRNA and Surgeon  Anesthesia Plan Comments: (COVID protocol. Not a candidate for a spinal 2/2 anti coagulation  Patient consented for risks of anesthesia including but not limited to:  - adverse reactions to medications - damage to eyes, teeth, lips or other oral mucosa - nerve damage due to positioning  - sore throat or hoarseness - Damage to heart, brain, nerves, lungs, other parts of body or loss of life  Patient voiced understanding.)         Anesthesia Quick Evaluation

## 2023-05-07 NOTE — Hospital Course (Addendum)
Rachael Humphrey is a 89 y.o. female with medical history significant for CAD s/p PCI, history of colon cancer, HTN, hypothyroidism, kyphoplasty and prior falls, who presents to the ED following an accidental fall while gardening.  Patient was recently diagnosed with COVID after having some mild respiratory symptoms and was improving and felt well enough to go out gardening.  She fell as she tried to turn around.  08/21: to ED, (+)acute mildly impacted comminuted intertrochanteric fracture of the left femur. Admitted to hospitalist service. Ortho planning surgery  08/22: to OR for intramedullary nail L fenur fx.  08/23 (Friday): PT/OT recs for SNF rehab, TOC to work on this, expect pt will be here thru the weekend  08/24: stable, no concerns   Consultants:  Orthopedic surgery   Procedures: 05/07/23 intramedullary nail L femur fracture       ASSESSMENT & PLAN:   Principal Problem:   Closed comminuted intertrochanteric fracture of proximal end of left femur, initial encounter (HCC) Active Problems:   Accidental fall   COVID-19 virus infection   Acquired hypothyroidism   Coronary artery disease   Benign essential hypertension   History of TIA (transient ischemic attack)   Type 2 diabetes mellitus without complication (HCC)   Closed comminuted intertrochanteric fracture of proximal end of left femur, initial encounter (HCC) Accidental fall Preoperative clearance - At low/optimized risk of perioperative cardiopulmonary events Patient has a history of CAD and has been symptom-free.  Last saw her cardiologist in February 2024 and was given a 72-month follow-up.  Also has a history of prior TIA maintained on Plavix and atorvastatin S/p intramedullary nail placement  Pain control SNF rehab  Ortho follow-up   Recent diagnosis of COVID  just over a week ago but symptomatic only for weakness that has been improving Respiratory precautions   Acquired hypothyroidism Continue  levothyroxine  Coronary artery disease Patient with no anginal pain.  EKG nonacute Continue atorvastatin and metoprolol Reduced metoprolol d/t borderline low BP this morning  held Plavix for surgery, restarted today   Benign essential hypertension Blood pressure controlled Reduced metoprolol d/t borderline low BP this morning   History of TIA (transient ischemic attack) Continue atorvastatin, plavix   Type 2 diabetes mellitus without complication (HCC) Sliding scale insulin coverage    DVT prophylaxis: lovenox  Pertinent IV fluids/nutrition: IV fluids not needed once taking po  Central lines / invasive devices: none  Code Status: DNR ACP documentation reviewed: none on file   Current Admission Status: inpatient  TOC needs / Dispo plan: SNF rehab Barriers to discharge / significant pending items: will need SNF and expect she will be here thru the weekend pending placement

## 2023-05-07 NOTE — Anesthesia Procedure Notes (Signed)
Procedure Name: Intubation Date/Time: 05/07/2023 1:17 PM  Performed by: Monico Hoar, CRNAPre-anesthesia Checklist: Patient identified, Patient being monitored, Timeout performed, Emergency Drugs available and Suction available Patient Re-evaluated:Patient Re-evaluated prior to induction Oxygen Delivery Method: Circle system utilized Preoxygenation: Pre-oxygenation with 100% oxygen Induction Type: IV induction Ventilation: Mask ventilation without difficulty Laryngoscope Size: Mac and 3 Grade View: Grade I Tube type: Oral Tube size: 6.5 mm Number of attempts: 1 Airway Equipment and Method: Stylet Placement Confirmation: ETT inserted through vocal cords under direct vision, positive ETCO2 and breath sounds checked- equal and bilateral Secured at: 21 cm Tube secured with: Tape Dental Injury: Teeth and Oropharynx as per pre-operative assessment

## 2023-05-07 NOTE — Progress Notes (Signed)
Patient awake to name, aware she is in "[facility]" hospital and that she has had hip surgery. Dressing xeroform/honeycomb x2 dressings (three sites), old drainage noted: marked. Pulses intact +2 bilateral. H/o HTN, did not take hypertensive medication today. Reviewed procedure, and orders with [facility] RN for room 160.  Family updated and made aware of room assignment. Afebrile, denies c/o's pain upon transfer.

## 2023-05-07 NOTE — Consult Note (Signed)
ORTHOPAEDIC CONSULTATION  REQUESTING PHYSICIAN: Sunnie Nielsen, DO  Chief Complaint:   L hip pain  History of Present Illness: Rachael Humphrey is a 89 y.o. female who had a fall yesterday while in her garden. Of note, she was diagnosed with COVID recently and felt improved as of yesterday.  The patient noted immediate hip pain and inability to ambulate after the fall.  The patient ambulates unassisted at baseline. The patient lives alone at home but has many caregivers that frequently help her. Pain is worse with any sort of movement.  Imaging in the emergency department show a left intertrochanteric hip fracture.  Medical hx significant for CAD s/p PCI, diabetes, history of colon cancer, HTN, hypothyroidism, and multiple kyphoplasties.   Past Medical History:  Diagnosis Date   Adenocarcinoma, colon (HCC) 2005   Biatrial enlargement    Chronic anticoagulation    Clopidogrel   Complication of anesthesia    Coronary artery disease    a.) 2.75 x 28 mm Vision BMS to pLAD on 02/23/2014   GERD (gastroesophageal reflux disease)    Grade II diastolic dysfunction    H/O hiatal hernia    Hemorrhoids    internal-not a problem now.   History of kidney stones    multiple   History of methicillin resistant staphylococcus aureus (MRSA) 2020   History of shingles 04/18/2014   HLD (hyperlipidemia)    Hypertension    Hypothyroidism    Myocardial infarction (HCC)    PONV (postoperative nausea and vomiting)    Senile osteoporosis    T2DM (type 2 diabetes mellitus) (HCC)    TIA (transient ischemic attack)    Valvular regurgitation    panvalvular   Past Surgical History:  Procedure Laterality Date   APPENDECTOMY     AUGMENTATION MAMMAPLASTY Bilateral 1978   BREAST BIOPSY Right    over 30 years ago negative   BREAST BIOPSY Left    negative over 30 years ago   CATARACT EXTRACTION, BILATERAL     COLECTOMY     partial small  and large instestine removed   CORONARY ANGIOPLASTY WITH STENT PLACEMENT N/A 02/23/2014   2.75 x 28 mm Vision BMS x 1 to pLAD   CYSTOSCOPY WITH RETROGRADE PYELOGRAM, URETEROSCOPY AND STENT PLACEMENT Bilateral 05/17/2014   Procedure: CYSTOSCOPY WITH RETROGRADE PYELOGRAM, URETEROSCOPY AND STENT PLACEMENT, BASKET STONE REMOVAL;  Surgeon: Sebastian Ache, MD;  Location: [facility];  Service: Urology;  Laterality: Bilateral;   KYPHOPLASTY N/A 11/25/2018   Procedure: KYPHOPLASTY T11, DIABETIC;  Surgeon: Kennedy Bucker, MD;  Location: [facility];  Service: Orthopedics;  Laterality: N/A;   KYPHOPLASTY N/A 11/28/2020   Procedure: [id number]  USE VASCULAR BED;  Surgeon: Kennedy Bucker, MD;  Location: [facility];  Service: Orthopedics;  Laterality: N/A;   KYPHOPLASTY N/A 04/25/2021   Procedure: L 3 KYPHOPLASTY;  Surgeon: Kennedy Bucker, MD;  Location: [facility];  Service: Orthopedics;  Laterality: N/A;   Social History   Socioeconomic History   Marital status: Widowed    Spouse name: Not on file   Number of children: Not on file   Years of education: Not on file   Highest education level: Not on file  Occupational History   Not on file  Tobacco Use   Smoking status: Never   Smokeless tobacco: Never  Vaping Use   Vaping status: Never Used  Substance and Sexual Activity   Alcohol use: No   Drug use: No   Sexual activity: Yes  Other Topics Concern   Not  on file  Social History Narrative   Lives alone but two sisters are close by to help.   Social Determinants of Health   Financial Resource Strain: Not on file  Food Insecurity: No Food Insecurity (05/07/2023)   Hunger Vital Sign    Worried About Running Out of Food in the Last Year: Never true    Ran Out of Food in the Last Year: Never true  Transportation Needs: No Transportation Needs (05/07/2023)   PRAPARE - Administrator, Civil Service (Medical): No    Lack of Transportation (Non-Medical): No  Physical Activity: Not on file   Stress: Not on file  Social Connections: Not on file   Family History  Problem Relation Age of Onset   Stroke Mother    Breast cancer Sister 71   Allergies  Allergen Reactions   Codeine Nausea And Vomiting   Darvon [Propoxyphene] Other (See Comments)    Altered mental status     Demerol [Meperidine] Other (See Comments)    Altered mental status     Gabapentin Other (See Comments)   Sulfa Antibiotics Other (See Comments)   Tramadol Other (See Comments)    "makes me crazy"  "makes me crazy"    Voltaren [Diclofenac Sodium] Other (See Comments)    "Makes me crazy" "Makes me crazy"   Zinc     Hurting in lower stomach    Prior to Admission medications   Medication Sig Start Date End Date Taking? Authorizing Provider  acetaminophen (TYLENOL) 500 MG tablet Take 2 tablets (1,000 mg total) by mouth every 8 (eight) hours as needed. 03/25/22  Yes Lynn Ito, MD  atorvastatin (LIPITOR) 20 MG tablet Take 20 mg by mouth daily.   Yes [provider]  Calcium Carb-Cholecalciferol (CALCIUM 600 + D PO) Take 1 tablet by mouth daily.   Yes [provider]  cetirizine (ZYRTEC) 10 MG tablet Take 10 mg by mouth daily. 07/18/16  Yes [provider]  clopidogrel (PLAVIX) 75 MG tablet Take 75 mg by mouth daily.  11/12/15  Yes [provider]  fluticasone (FLONASE) 50 MCG/ACT nasal spray Place 2 sprays into both nostrils daily as needed for allergies.  11/12/15  Yes [provider]  levothyroxine (SYNTHROID, LEVOTHROID) 25 MCG tablet Take 25 mcg by mouth daily before breakfast.  12/10/16  Yes [provider]  metoprolol succinate (TOPROL-XL) 50 MG 24 hr tablet Take 75 mg by mouth every morning.    Yes [provider]  montelukast (SINGULAIR) 10 MG tablet Take 10 mg by mouth at bedtime.  07/18/16  Yes [provider]  Multiple Vitamin (MULTIVITAMIN WITH MINERALS) TABS tablet Take 1 tablet by mouth daily.   Yes [provider]   NIFEdipine (PROCARDIA-XL/NIFEDICAL-XL) 30 MG 24 hr tablet Take 30 mg by mouth daily. 11/06/18  Yes [provider]  pantoprazole (PROTONIX) 40 MG tablet Take 1 tablet by mouth 2 (two) times daily. 02/13/23  Yes [provider]   Recent Labs    05/06/23 2027  WBC 6.9  HGB 13.8  HCT 39.7  PLT 140*  K 4.0  CL 106  CO2 21*  BUN 17  CREATININE 0.62  GLUCOSE 160*  CALCIUM 8.3*   CT Hip Left Wo Contrast  Result Date: 05/06/2023 CLINICAL DATA:  Hip fracture EXAM: CT OF THE LEFT HIP WITHOUT CONTRAST TECHNIQUE: Multidetector CT imaging of the left hip was performed according to the standard protocol. Multiplanar CT image reconstructions were also generated. RADIATION DOSE  REDUCTION: This exam was performed according to the departmental dose-optimization program which includes automated exposure control, adjustment of the mA and/or kV according to patient size and/or use of iterative reconstruction technique. COMPARISON:  Left hip x-ray same day FINDINGS: Bones/Joint/Cartilage The bones are diffusely osteopenic. There is an acute mildly impacted comminuted intratrochanteric fracture of the left femur. There is mild apex anterior angulation. There is no dislocation. No focal osseous lesion identified. Ligaments Suboptimally assessed by CT. Muscles and Tendons There is intramuscular edema surrounding the fracture site. No definitive hematoma. Soft tissues Colonic diverticulosis present. No focal hematoma or fluid collection. IMPRESSION: Acute mildly impacted comminuted intratrochanteric fracture of the left femur. Electronically Signed   By: Darliss Cheney M.D.   On: 05/06/2023 23:04   DG Chest Portable 1 View  Result Date: 05/06/2023 CLINICAL DATA:  Ground level fall and left hip pain.  Preop. EXAM: PORTABLE CHEST 1 VIEW COMPARISON:  07/26/2022 FINDINGS: Stable cardiomediastinal silhouette. Aortic atherosclerotic calcification. Bibasilar atelectasis/scarring. No focal consolidation,  pleural effusion, or pneumothorax. No displaced rib fractures. Moderate hiatal hernia. IMPRESSION: No acute cardiopulmonary disease. Moderate hiatal hernia. Electronically Signed   By: Minerva Fester M.D.   On: 05/06/2023 22:33   DG Hip Unilat W or Wo Pelvis 2-3 Views Left  Result Date: 05/06/2023 CLINICAL DATA:  Fall, left hip pain EXAM: DG HIP (WITH OR WITHOUT PELVIS) 2-3V LEFT COMPARISON:  None Available. FINDINGS: The osseous structures are diffusely markedly osteopenic. There is an acute, impacted basicervical/intratrochanteric fracture of the left hip with probable cortical avulsion of the lesser trochanter. Femoral head is still seated within the left acetabulum. Mild bilateral degenerative hip arthritis. Degenerative changes are seen within the sacroiliac joints. No other fracture identified. L4 vertebroplasty has been performed. IMPRESSION: 1. Acute, impacted basicervical/intratrochanteric fracture of the left hip. Electronically Signed   By: Helyn Numbers M.D.   On: 05/06/2023 21:34     Positive ROS: All other systems have been reviewed and were otherwise negative with the exception of those mentioned in the HPI and as above.  Physical Exam: BP (!) 172/83   Pulse 80   Temp 98 F (36.7 C) (Oral)   Resp 13   Ht 5\' 1"  (1.549 m)   Wt 55.9 kg   SpO2 97%   BMI 23.29 kg/m  General:  Alert, no acute distress Psychiatric:  Patient is competent for consent with normal mood and affect     Orthopedic Exam:  LLE: + DF/PF/EHL SILT grossly over foot Foot wwp +Log roll/axial load  Imaging:  As above: L intertrochanteric hip fracture  Assessment/Plan: YESENNIA MUSCAT is a 89 y.o. female with a L intertrochanteric hip fracture   1. I discussed the various treatment options including both surgical and non-surgical management of the fracture with the patient and/or family (medical PoA). We discussed the high risk of perioperative complications due to patient's age and other  co-morbidities. After discussion of risks, benefits, and alternatives to surgery, the family and/or patient were in agreement to proceed with surgery. The goals of surgery would be to provide adequate pain relief and allow for mobilization. Plan for surgery is L hip cephalomedullary nailing today, 05/07/2023. 2. NPO until OR 3. Hold anticoagulation in advance of OR      Signa Kell   05/07/2023 12:57 PM

## 2023-05-07 NOTE — Plan of Care (Signed)

## 2023-05-07 NOTE — Progress Notes (Addendum)
Initial Nutrition Assessment  DOCUMENTATION CODES:   Not applicable  INTERVENTION:   -Once diet is advanced, add:   -Ensure Enlive po BID, each supplement provides 350 kcal and 20 grams of protein.  -MVI with minerals daily  NUTRITION DIAGNOSIS:   Increased nutrient needs related to post-op healing as evidenced by estimated needs.  GOAL:   Patient will meet greater than or equal to 90% of their needs  MONITOR:   PO intake, Supplement acceptance, Diet advancement  REASON FOR ASSESSMENT:   Consult Assessment of nutrition requirement/status, Hip fracture protocol  ASSESSMENT:   Pt with PMH of CAD s/p PCI, diabetes, history of colon cancer, HTN, hypothyroidism, and multiple kyphoplasties admitted with lt hip fx s/p fall.  Pt admitted with lt hip fracture.   Reviewed I/O's: +500 ml x 24 hours  Pt unavailable at time of visit. Attempted to speak with pt via call to hospital room phone, however, unable to reach. RD unable to obtain further nutrition-related history or complete nutrition-focused physical exam at this time.    Pt recently diagnosed with COIVD, however, symptoms have been improving. Noted pt fell while working in her garden PTA.   Per orthopedics notes plan for cephalopmedullary nailing today. Pt currently NPO for procedure.   Reviewed wt hx; wt has been stable over the past year.   Pt with increased nutritional needs for post-op healing and would benefit from addition of oral nutrition supplements.   Medications reviewed.   Lab Results  Component Value Date   HGBA1C 6.6 (H) 05/06/2023   PTA DM medications are none.   Labs reviewed: CBGS: 146-187 (inpatient orders for glycemic control are 0-9 units insulin aspart every 4 hours).    Diet Order:   Diet Order             Diet NPO time specified  Diet effective midnight                   EDUCATION NEEDS:   No education needs have been identified at this time  Skin:  Skin Assessment:  Reviewed RN Assessment  Last BM:  Unknown  Height:   Ht Readings from Last 1 Encounters:  05/06/23 5\' 1"  (1.549 m)    Weight:   Wt Readings from Last 1 Encounters:  05/06/23 55.9 kg    Ideal Body Weight:  47.7 kg  BMI:  Body mass index is 23.29 kg/m.  Estimated Nutritional Needs:   Kcal:  1500-1700  Protein:  75-90 grams  Fluid:  > 1.5 L    Levada Schilling, RD, LDN, CDCES Registered Dietitian II Certified Diabetes Care and Education Specialist Please refer to [facility] for RD and/or RD on-call/weekend/after hours pager

## 2023-05-07 NOTE — Transfer of Care (Signed)
Immediate Anesthesia Transfer of Care Note  Patient: Rachael Humphrey  Procedure(s) Performed: INTRAMEDULLARY (IM) NAIL INTERTROCHANTERIC (Left)  Patient Location: PACU and OR  Anesthesia Type:General  Level of Consciousness: awake  Airway & Oxygen Therapy: Patient Spontanous Breathing and Patient connected to face mask oxygen  Post-op Assessment: Report given to RN and Post -op Vital signs reviewed and stable  Post vital signs: Reviewed and stable  Last Vitals:  Vitals Value Taken Time  BP 175/93   Temp    Pulse 87   Resp 16   SpO2 100     Last Pain:  Vitals:   05/07/23 0915  TempSrc:   PainSc: 6          Complications: No notable events documented.

## 2023-05-07 NOTE — Progress Notes (Signed)
PROGRESS NOTE    Rachael Humphrey   ZOX:[id number] DOB: 30-Sep-1929  DOA: 05/06/2023 Date of Service: 05/07/23 PCP: Marguarite Arbour, MD     Brief Narrative / Hospital Course:   Rachael Humphrey is a 89 y.o. female with medical history significant for CAD s/p PCI, history of colon cancer, HTN, hypothyroidism, kyphoplasty and prior falls, who presents to the ED following an accidental fall while gardening.  Patient was recently diagnosed with COVID after having some mild respiratory symptoms and was improving and felt well enough to go out gardening.  She fell as she tried to turn around.  08/21: to ED, (+)acute mildly impacted comminuted intertrochanteric fracture of the left femur. Admitted to hospitalist service. Ortho planning surgery  08/22: to OR for intramedullary nail L fenur fx.   Consultants:  Orthopedic surgery   Procedures: 05/07/23 intramedullary nail L femur fracture       ASSESSMENT & PLAN:   Principal Problem:   Closed comminuted intertrochanteric fracture of proximal end of left femur, initial encounter (HCC) Active Problems:   Accidental fall   COVID-19 virus infection   Acquired hypothyroidism   Coronary artery disease   Benign essential hypertension   History of TIA (transient ischemic attack)   Type 2 diabetes mellitus without complication (HCC)   Closed comminuted intertrochanteric fracture of proximal end of left femur, initial encounter (HCC) Accidental fall Preoperative clearance - At low/optimized risk of perioperative cardiopulmonary events Patient has a history of CAD and has been symptom-free.  Last saw her cardiologist in February 2024 and was given a 61-month follow-up.  Also has a history of prior TIA maintained on Plavix and atorvastatin Orthopedics plan to OR for intramedullary nail placement  Will hold Plavix for surgery Pain control N.p.o. from midnight PT/OT when able, suspect will need SNF rehab or at least HH/DME   Recent diagnosis  of COVID  just over a week ago but symptomatic only for weakness that has been improving Respiratory precautions   Acquired hypothyroidism Continue levothyroxine  Coronary artery disease Patient with no anginal pain.  EKG nonacute Continue atorvastatin and metoprolol Holding Plavix for surgery  Benign essential hypertension Blood pressure controlled Continue metoprolol  History of TIA (transient ischemic attack) Holding Plavix for surgery Continue atorvastatin  Type 2 diabetes mellitus without complication (HCC) Sliding scale insulin coverage    DVT prophylaxis: plan lovenox once out of OR Pertinent IV fluids/nutrition: IV fluids not needed if taking po  Central lines / invasive devices: none  Code Status: DNR ACP documentation reviewed: none on file   Current Admission Status: inpatient  TOC needs / Dispo plan: anticipate will need SNF rehab  Barriers to discharge / significant pending items: surgery today, PT/OT to eval likely tomorrow expect will need SNF and may be here thru the weekend              Subjective / Brief ROS:  Patient reports feeling tired, she hurts  Denies CP/SOB.  Denies new weakness.  Reports no concerns w/ urination/defecation.   Family Communication: family at bedside on rounds     Objective Findings:  Vitals:   05/07/23 0915 05/07/23 0930 05/07/23 1451 05/07/23 1500  BP:  (!) 172/83 (!) 178/94 (!) 168/88  Pulse: 75 80 86 84  Resp: 18 13 14 16   Temp:   (!) 97.2 F (36.2 C)   TempSrc:      SpO2: 92% 97% 100% 99%  Weight:      Height:  Intake/Output Summary (Last 24 hours) at 05/07/2023 1508 Last data filed at 05/07/2023 1436 Gross per 24 hour  Intake 900 ml  Output 10 ml  Net 890 ml   Filed Weights   05/06/23 1939  Weight: 55.9 kg    Examination:  Physical Exam Constitutional:      General: She is not in acute distress. Cardiovascular:     Rate and Rhythm: Normal rate and regular rhythm.  Pulmonary:      Effort: Pulmonary effort is normal.     Breath sounds: Normal breath sounds.  Abdominal:     Palpations: Abdomen is soft.  Musculoskeletal:     Right lower leg: No edema.     Left lower leg: No edema.  Neurological:     Mental Status: She is alert. Mental status is at baseline.  Psychiatric:        Mood and Affect: Mood normal.        Behavior: Behavior normal.          Scheduled Medications:   [MAR Hold] atorvastatin  20 mg Oral Daily   [START ON 05/08/2023] feeding supplement  237 mL Oral BID BM   [MAR Hold] insulin aspart  0-9 Units Subcutaneous Q4H   [MAR Hold] levothyroxine  25 mcg Oral Q0600   [MAR Hold] metoprolol succinate  75 mg Oral q morning   [START ON 05/08/2023] multivitamin with minerals  1 tablet Oral Daily    Continuous Infusions:  [MAR Hold] methocarbamol (ROBAXIN) IV      PRN Medications:  [MAR Hold] albuterol, fentaNYL (SUBLIMAZE) injection, [MAR Hold] guaiFENesin-dextromethorphan, [MAR Hold] HYDROcodone-acetaminophen, [MAR Hold] methocarbamol **OR** [MAR Hold] methocarbamol (ROBAXIN) IV, [MAR Hold]  morphine injection, [MAR Hold]  morphine injection, [MAR Hold] ondansetron (ZOFRAN) IV, oxyCODONE **OR** oxyCODONE  Antimicrobials from admission:  Anti-infectives (From admission, onward)    Start     Dose/Rate Route Frequency Ordered Stop   05/07/23 1130  ceFAZolin (ANCEF) IVPB 2g/100 mL premix        2 g 200 mL/hr over 30 Minutes Intravenous On call to O.R. 05/06/23 2207 05/07/23 1206           Data Reviewed:  I have personally reviewed the following...  CBC: Recent Labs  Lab 05/06/23 2027  WBC 6.9  NEUTROABS 4.5  HGB 13.8  HCT 39.7  MCV 93.4  PLT 140*   Basic Metabolic Panel: Recent Labs  Lab 05/06/23 2027  NA 135  K 4.0  CL 106  CO2 21*  GLUCOSE 160*  BUN 17  CREATININE 0.62  CALCIUM 8.3*   GFR: Estimated Creatinine Clearance: 34.6 mL/min (by C-G formula based on SCr of 0.62 mg/dL). Liver Function Tests: No  results for input(s): "AST", "ALT", "ALKPHOS", "BILITOT", "PROT", "ALBUMIN" in the last 168 hours. No results for input(s): "LIPASE", "AMYLASE" in the last 168 hours. No results for input(s): "AMMONIA" in the last 168 hours. Coagulation Profile: No results for input(s): "INR", "PROTIME" in the last 168 hours. Cardiac Enzymes: No results for input(s): "CKTOTAL", "CKMB", "CKMBINDEX", "TROPONINI" in the last 168 hours. BNP (last 3 results) No results for input(s): "PROBNP" in the last 8760 hours. HbA1C: Recent Labs    05/06/23 2027  HGBA1C 6.6*   CBG: Recent Labs  Lab 05/07/23 0006 05/07/23 0413 05/07/23 0734 05/07/23 1451  GLUCAP 187* 151* 146* 133*   Lipid Profile: No results for input(s): "CHOL", "HDL", "LDLCALC", "TRIG", "CHOLHDL", "LDLDIRECT" in the last 72 hours. Thyroid Function Tests: No results for input(s): "TSH", "T4TOTAL", "FREET4", "  T3FREE", "THYROIDAB" in the last 72 hours. Anemia Panel: No results for input(s): "VITAMINB12", "FOLATE", "FERRITIN", "TIBC", "IRON", "RETICCTPCT" in the last 72 hours. Most Recent Urinalysis On File:     Component Value Date/Time   COLORURINE YELLOW (A) 05/06/2023 2027   APPEARANCEUR CLEAR (A) 05/06/2023 2027   APPEARANCEUR Clear 04/01/2014 0246   LABSPEC 1.009 05/06/2023 2027   LABSPEC 1.006 04/01/2014 0246   PHURINE 6.0 05/06/2023 2027   GLUCOSEU 50 (A) 05/06/2023 2027   GLUCOSEU >=500 04/01/2014 0246   HGBUR NEGATIVE 05/06/2023 2027   BILIRUBINUR NEGATIVE 05/06/2023 2027   BILIRUBINUR Negative 04/01/2014 0246   KETONESUR NEGATIVE 05/06/2023 2027   PROTEINUR NEGATIVE 05/06/2023 2027   NITRITE NEGATIVE 05/06/2023 2027   LEUKOCYTESUR MODERATE (A) 05/06/2023 2027   LEUKOCYTESUR Negative 04/01/2014 0246   Sepsis Labs: @LABRCNTIP (procalcitonin:4,lacticidven:4) Microbiology: Recent Results (from the past 240 hour(s))  SARS Coronavirus 2 by RT PCR (hospital order, performed in [facility] hospital lab) *cepheid single result  test* Urine, Clean Catch     Status: Abnormal   Collection Time: 05/06/23  8:27 PM   Specimen: Urine, Clean Catch; Nasal Swab  Result Value Ref Range Status   SARS Coronavirus 2 by RT PCR POSITIVE (A) NEGATIVE Final    Comment: (NOTE) SARS-CoV-2 target nucleic acids are DETECTED  SARS-CoV-2 RNA is generally detectable in upper respiratory specimens  during the acute phase of infection.  Positive results are indicative  of the presence of the identified virus, but do not rule out bacterial infection or co-infection with other pathogens not detected by the test.  Clinical correlation with patient history and  other diagnostic information is necessary to determine patient infection status.  The expected result is negative.  Fact Sheet for Patients:   RoadLapTop.co.za   Fact Sheet for Healthcare Providers:   http://kim-miller.com/    This test is not yet approved or cleared by the [place] FDA and  has been authorized for detection and/or diagnosis of SARS-CoV-2 by FDA under an Emergency Use Authorization (EUA).  This EUA will remain in effect (meaning this test can be used) for the duration of  the COVID-19 declaration under Section 564(b)(1)  of the Act, 21 U.S.C. section 360-bbb-3(b)(1), unless the authorization is terminated or revoked sooner.   Performed at [facility], [address] [place], [place] [address]       Radiology Studies last 3 days: DG HIP UNILAT WITH PELVIS 2-3 VIEWS LEFT  Result Date: 05/07/2023 CLINICAL DATA:  Surgical internal fixation of proximal left femoral fracture. EXAM: DG HIP (WITH OR WITHOUT PELVIS) 2-3V LEFT; DG C-ARM 1-60 MIN-NO REPORT Fluoroscopy time: 1 minute 24 seconds. COMPARISON:  May 06, 2023. FINDINGS: Six intraoperative fluoroscopic images were obtained the left hip during surgical internal fixation of proximal left femoral fracture. IMPRESSION: Fluoroscopic guidance provided  during surgical internal fixation of proximal left femoral fracture. Electronically Signed   By: Lupita Raider M.D.   On: 05/07/2023 15:02   DG C-Arm 1-60 Min-No Report  Result Date: 05/07/2023 Fluoroscopy was utilized by the requesting physician.  No radiographic interpretation.   CT Hip Left Wo Contrast  Result Date: 05/06/2023 CLINICAL DATA:  Hip fracture EXAM: CT OF THE LEFT HIP WITHOUT CONTRAST TECHNIQUE: Multidetector CT imaging of the left hip was performed according to the standard protocol. Multiplanar CT image reconstructions were also generated. RADIATION DOSE REDUCTION: This exam was performed according to the departmental dose-optimization program which includes automated exposure control, adjustment of the mA and/or kV  according to patient size and/or use of iterative reconstruction technique. COMPARISON:  Left hip x-ray same day FINDINGS: Bones/Joint/Cartilage The bones are diffusely osteopenic. There is an acute mildly impacted comminuted intratrochanteric fracture of the left femur. There is mild apex anterior angulation. There is no dislocation. No focal osseous lesion identified. Ligaments Suboptimally assessed by CT. Muscles and Tendons There is intramuscular edema surrounding the fracture site. No definitive hematoma. Soft tissues Colonic diverticulosis present. No focal hematoma or fluid collection. IMPRESSION: Acute mildly impacted comminuted intratrochanteric fracture of the left femur. Electronically Signed   By: Darliss Cheney M.D.   On: 05/06/2023 23:04   DG Chest Portable 1 View  Result Date: 05/06/2023 CLINICAL DATA:  Ground level fall and left hip pain.  Preop. EXAM: PORTABLE CHEST 1 VIEW COMPARISON:  07/26/2022 FINDINGS: Stable cardiomediastinal silhouette. Aortic atherosclerotic calcification. Bibasilar atelectasis/scarring. No focal consolidation, pleural effusion, or pneumothorax. No displaced rib fractures. Moderate hiatal hernia. IMPRESSION: No acute cardiopulmonary  disease. Moderate hiatal hernia. Electronically Signed   By: Minerva Fester M.D.   On: 05/06/2023 22:33   DG Hip Unilat W or Wo Pelvis 2-3 Views Left  Result Date: 05/06/2023 CLINICAL DATA:  Fall, left hip pain EXAM: DG HIP (WITH OR WITHOUT PELVIS) 2-3V LEFT COMPARISON:  None Available. FINDINGS: The osseous structures are diffusely markedly osteopenic. There is an acute, impacted basicervical/intratrochanteric fracture of the left hip with probable cortical avulsion of the lesser trochanter. Femoral head is still seated within the left acetabulum. Mild bilateral degenerative hip arthritis. Degenerative changes are seen within the sacroiliac joints. No other fracture identified. L4 vertebroplasty has been performed. IMPRESSION: 1. Acute, impacted basicervical/intratrochanteric fracture of the left hip. Electronically Signed   By: Helyn Numbers M.D.   On: 05/06/2023 21:34             LOS: 1 day      Sunnie Nielsen, DO [facility] 05/07/2023, 3:08 PM    Dictation software may have been used to generate the above note. Typos may occur and escape review in typed/dictated notes. Please contact Dr Lyn Hollingshead directly for clarity if needed.  Staff may message me via secure chat in Epic  but this may not receive an immediate response,  please page me for urgent matters!  If 7PM-7AM, please contact night coverage [email or web address]

## 2023-05-07 NOTE — H&P (Signed)
H&P reviewed. No significant changes noted.  

## 2023-05-07 NOTE — Op Note (Signed)
DATE OF SURGERY: 05/07/2023  PREOPERATIVE DIAGNOSIS: Left intertrochanteric hip fracture  POSTOPERATIVE DIAGNOSIS: Left intertrochanteric hip fracture  PROCEDURE: Intramedullary nailing of Left femur with cephalomedullary device  SURGEON: Rosealee Albee, MD  ANESTHESIA: Gen  EBL: 100 cc  IVF: per anesthesia record  COMPONENTS:  [employer] Trigen Intertan Short Nail: 10x184mm; 90mm lag screw with 85mm compression screw; 5x 30mm distal cortical interlocking screw  INDICATIONS: Rachael Humphrey is a 89 y.o. female who sustained an intertrochanteric fracture after a fall. Risks and benefits of intramedullary nailing were explained to the patient and/or family . Risks include but are not limited to bleeding, infection, injury to tissues, nerves, vessels, nonunion/malunion, hardware failure, limb length discrepancy/hip rotation mismatch and risks of anesthesia. The patient and/or family understand these risks, have completed an informed consent, and wish to proceed.   PROCEDURE:  The patient was brought into the operating room. After administering anesthesia, the patient was placed in the supine position on the Hana table. The uninjured leg was placed in an extended position while the injured lower extremity was placed in longitudinal traction. The fracture was reduced using longitudinal traction and internal rotation. The adequacy of reduction was verified fluoroscopically in AP and lateral projections. The lateral aspect of the hip and thigh were prepped with ChloraPrep solution before being draped sterilely. Preoperative IV antibiotics were administered. A timeout was performed to verify the appropriate surgical site, patient, and procedure.    The greater trochanter was identified and an approximately 6 cm incision was made about 3 fingerbreadths above the tip of the greater trochanter. The incision was carried down through the subcutaneous tissues to expose the gluteal fascia. This was split  the length of the incision, providing access to the tip of the trochanter. Under fluoroscopic guidance, a guidewire was drilled through the tip of the trochanter into the proximal metaphysis to the level of the lesser trochanter. After verifying its position fluoroscopically in AP and lateral projections, it was overreamed with the opening reamer to the level of the lesser trochanter. The nail was selected and advanced to the appropriate depth as verified fluoroscopically.    The guide system for the lag screw was positioned and advanced through an approximately 5cm incision over the lateral aspect of the proximal femur. The guidewire was drilled up through the femoral nail and into the femoral neck to rest within 5 mm of subchondral bone. After verifying its position in the femoral neck and head in both AP and lateral projections, the guidewire was measured and appropriate sized lag screw was selected.  The channel for the compression screw was drilled and antirotation bar was placed.  Lag screw was drilled and placed in appropriate position.  Compression screw was then placed.  Appropriate compression was achieved.  The set screw was locked in place. Again, the adequacy of hardware position and fracture reduction was verified fluoroscopically in AP and lateral projections.   Attention was then turned to the distal interlocking screw in the diaphysis. Using a targeted assembly, a stab incision was made and hole was drilled through the nail. An interlocking screw was placed with excellent purchase.  Appropriate screw position was verified fluoroscopically in AP and lateral projections.   The wounds were irrigated thoroughly with sterile saline solution. Local anesthetic was injected into the wounds. Deep fascia was closed with 0-Vicryl. The subcutaneous tissues were closed using 2-0 Vicryl interrupted sutures. The skin was closed using staples. Sterile occlusive dressings were applied to all wounds. The  patient was then transferred to the recovery room in satisfactory condition.   POSTOPERATIVE PLAN: The patient will be WBAT on the operative extremity. Resume Plavix on POD#1. Start ASA 325mg /day x 4 weeks in addition to Plavix for DVT ppx. Perioperative IV antibiotics x 24 hours. PT/OT on POD#1.

## 2023-05-08 ENCOUNTER — Encounter: Payer: Self-pay | Admitting: Orthopedic Surgery

## 2023-05-08 DIAGNOSIS — S72142A Displaced intertrochanteric fracture of left femur, initial encounter for closed fracture: Secondary | ICD-10-CM | POA: Diagnosis not present

## 2023-05-08 LAB — GLUCOSE, CAPILLARY
Glucose-Capillary: 102 mg/dL — ABNORMAL HIGH (ref 70–99)
Glucose-Capillary: 134 mg/dL — ABNORMAL HIGH (ref 70–99)
Glucose-Capillary: 134 mg/dL — ABNORMAL HIGH (ref 70–99)
Glucose-Capillary: 166 mg/dL — ABNORMAL HIGH (ref 70–99)
Glucose-Capillary: 167 mg/dL — ABNORMAL HIGH (ref 70–99)
Glucose-Capillary: 215 mg/dL — ABNORMAL HIGH (ref 70–99)

## 2023-05-08 LAB — BASIC METABOLIC PANEL
Anion gap: 13 (ref 5–15)
BUN: 16 mg/dL (ref 8–23)
CO2: 24 mmol/L (ref 22–32)
Calcium: 8.6 mg/dL — ABNORMAL LOW (ref 8.9–10.3)
Chloride: 99 mmol/L (ref 98–111)
Creatinine, Ser: 0.65 mg/dL (ref 0.44–1.00)
GFR, Estimated: >60 mL/min (ref >60)
Glucose, Bld: 124 mg/dL — ABNORMAL HIGH (ref 70–99)
Potassium: 3.6 mmol/L (ref 3.5–5.1)
Sodium: 136 mmol/L (ref 135–145)

## 2023-05-08 LAB — CBC
HCT: 32.4 % — ABNORMAL LOW (ref 36.0–46.0)
Hemoglobin: 11.2 g/dL — ABNORMAL LOW (ref 12.0–15.0)
MCH: 31.5 pg (ref 26.0–34.0)
MCHC: 34.6 g/dL (ref 30.0–36.0)
MCV: 91 fL (ref 80.0–100.0)
Platelets: 128 10*3/uL — ABNORMAL LOW (ref 150–400)
RBC: 3.56 MIL/uL — ABNORMAL LOW (ref 3.87–5.11)
RDW: 12.7 % (ref 11.5–15.5)
WBC: 9.7 10*3/uL (ref 4.0–10.5)
nRBC: 0 % (ref 0.0–0.2)

## 2023-05-08 MED ORDER — METOPROLOL SUCCINATE ER 50 MG PO TB24
50.0000 mg | ORAL_TABLET | Freq: Every morning | ORAL | Status: DC
  Administered 2023-05-08 – 2023-05-12 (×4): 50 mg via ORAL
  Filled 2023-05-08 (×6): qty 1

## 2023-05-08 MED ORDER — METHOCARBAMOL 500 MG PO TABS: 500.0000 mg | ORAL_TABLET | Freq: Four times a day (QID) | ORAL | 0 refills | Status: AC | PRN

## 2023-05-08 MED ORDER — ENOXAPARIN SODIUM 30 MG/0.3ML IJ SOSY
30.0000 mg | PREFILLED_SYRINGE | INTRAMUSCULAR | Status: DC
  Administered 2023-05-09 – 2023-05-11 (×3): 30 mg via SUBCUTANEOUS
  Filled 2023-05-08 (×4): qty 0.3

## 2023-05-08 MED ORDER — ONDANSETRON HCL 4 MG PO TABS: 4.0000 mg | ORAL_TABLET | Freq: Four times a day (QID) | ORAL | 0 refills | Status: AC | PRN

## 2023-05-08 MED ORDER — OXYCODONE HCL 5 MG PO TABS: 2.5000 mg | ORAL_TABLET | ORAL | 0 refills | Status: AC | PRN

## 2023-05-08 MED ORDER — ASPIRIN 325 MG PO TBEC: 325.0000 mg | DELAYED_RELEASE_TABLET | Freq: Every day | ORAL | Status: AC

## 2023-05-08 NOTE — Progress Notes (Signed)
Subjective: 1 Day Post-Op Procedure(s) (LRB): INTRAMEDULLARY (IM) NAIL INTERTROCHANTERIC (Left) Patient reports pain as moderate.  Having muscle spasms in the left hip. Patient is experiencing more pain this morning from the spasms in her thigh and groin. Plan is to go Rehab after hospital stay. Negative for chest pain and shortness of breath Fever: no Gastrointestinal: Negative for nausea and vomiting  Objective: Vital signs in last 24 hours: Temp:  [97 F (36.1 C)-98 F (36.7 C)] 98 F (36.7 C) (08/23 0407) Pulse Rate:  [75-90] 81 (08/23 0407) Resp:  [13-32] 16 (08/23 0407) BP: (107-182)/(69-98) 129/70 (08/23 0407) SpO2:  [92 %-100 %] 96 % (08/23 0407)  Intake/Output from previous day:  Intake/Output Summary (Last 24 hours) at 05/08/2023 0640 Last data filed at 05/08/2023 0420 Gross per 24 hour  Intake 1363.17 ml  Output 260 ml  Net 1103.17 ml    Intake/Output this shift: Total I/O In: 874.6 [I.V.:674.6; IV Piggyback:200] Out: 250 [Urine:250]  Labs: Recent Labs    05/06/23 2027 05/08/23 0548  HGB 13.8 11.2*   Recent Labs    05/06/23 2027 05/08/23 0548  WBC 6.9 9.7  RBC 4.25 3.56*  HCT 39.7 32.4*  PLT 140* 128*   Recent Labs    05/06/23 2027 05/08/23 0548  NA 135 136  K 4.0 3.6  CL 106 99  CO2 21* 24  BUN 17 16  CREATININE 0.62 0.65  GLUCOSE 160* 124*  CALCIUM 8.3* 8.6*   No results for input(s): "LABPT", "INR" in the last 72 hours.   EXAM General - Patient is Alert and Oriented Extremity - Neurovascular intact Sensation intact distally Dorsiflexion/Plantar flexion intact Compartment soft Dressing/Incision - clean, dry, no drainage Motor Function - intact, moving foot and toes well on exam.   Past Medical History:  Diagnosis Date   Adenocarcinoma, colon (HCC) 2005   Biatrial enlargement    Chronic anticoagulation    Clopidogrel   Complication of anesthesia    Coronary artery disease    a.) 2.75 x 28 mm Vision BMS to pLAD on  02/23/2014   GERD (gastroesophageal reflux disease)    Grade II diastolic dysfunction    H/O hiatal hernia    Hemorrhoids    internal-not a problem now.   History of kidney stones    multiple   History of methicillin resistant staphylococcus aureus (MRSA) 2020   History of shingles 04/18/2014   HLD (hyperlipidemia)    Hypertension    Hypothyroidism    Myocardial infarction (HCC)    PONV (postoperative nausea and vomiting)    Senile osteoporosis    T2DM (type 2 diabetes mellitus) (HCC)    TIA (transient ischemic attack)    Valvular regurgitation    panvalvular    Assessment/Plan: 1 Day Post-Op Procedure(s) (LRB): INTRAMEDULLARY (IM) NAIL INTERTROCHANTERIC (Left) Principal Problem:   Closed comminuted intertrochanteric fracture of proximal end of left femur, initial encounter (HCC) Active Problems:   Acquired hypothyroidism   Coronary artery disease   Benign essential hypertension   History of TIA (transient ischemic attack)   Type 2 diabetes mellitus without complication (HCC)   Accidental fall   COVID-19 virus infection  Estimated body mass index is 23.29 kg/m as calculated from the following:   Height as of this encounter: 5\' 1"  (1.549 m).   Weight as of this encounter: 55.9 kg. Advance diet Up with therapy  Discharge planning: Patient will follow-up in 2 weeks at [facility] orthopedics for staple removal and x-rays of the left hip.  No showering.  DVT Prophylaxis - Aspirin, TED hose, and Plavix Weight-Bearing as tolerated to left leg  Dedra Skeens, PA-C Orthopaedic Surgery 05/08/2023, 6:40 AM

## 2023-05-08 NOTE — Evaluation (Signed)
Physical Therapy Evaluation Patient Details Name: BENI SPURGEON MRN: [id number] DOB: 01/07/1930 Today's Date: 05/08/2023  History of Present Illness  89 y/o female presented to ED on 05/06/23 after fall while outside watering flowers. Sustained acute mildly impacted comminuted intertrochanteric fracture of L femur. S/p L femur IM nail on 8/22. Tested (+) for COVID. PMH: CAD s/p PCI, hx of colon cancer, HTN, prior falls, hx of kyphoplasty, T2DM  Clinical Impression  Patient admitted with the above. PTA, patient lives alone and was independent with mobility. Patient presents with weakness, impaired balance, decreased activity tolerance, and pain. Requires minA for bed mobility and sit to stand transfer with RW. Ambulated around bed to recliner with RW and minA. Patient confused throughout session thinking she was at home but able to answer orientation questions correctly without prompting. Patient will benefit from skilled PT services during acute stay to address listed deficits. Patient will benefit from ongoing therapy at discharge to maximize functional independence and safety.         If plan is discharge home, recommend the following: A little help with walking and/or transfers;A little help with bathing/dressing/bathroom;Assistance with cooking/housework;Direct supervision/assist for medications management;Direct supervision/assist for financial management;Assist for transportation;Help with stairs or ramp for entrance;Supervision due to cognitive status   Can travel by private vehicle   Yes    Equipment Recommendations Rolling Merdis Snodgrass (2 wheels);BSC/3in1  Recommendations for Other Services       Functional Status Assessment Patient has had a recent decline in their functional status and demonstrates the ability to make significant improvements in function in a reasonable and predictable amount of time.     Precautions / Restrictions Precautions Precautions: Fall Restrictions Weight  Bearing Restrictions: Yes LLE Weight Bearing: Weight bearing as tolerated      Mobility  Bed Mobility Overal bed mobility: Needs Assistance Bed Mobility: Supine to Sit     Supine to sit: Min assist          Transfers Overall transfer level: Needs assistance Equipment used: Rolling Chin Wachter (2 wheels) Transfers: Sit to/from Stand Sit to Stand: Min assist           General transfer comment: cues for hand placement    Ambulation/Gait Ambulation/Gait assistance: Min assist Gait Distance (Feet): 12 Feet Assistive device: Rolling Sayan Aldava (2 wheels) Gait Pattern/deviations: Step-to pattern, Decreased stride length, Decreased stance time - left, Decreased weight shift to left Gait velocity: decreased     General Gait Details: assist for balance. Decreased stance time on L  Stairs            Wheelchair Mobility     Tilt Bed    Modified Rankin (Stroke Patients Only)       Balance Overall balance assessment: Needs assistance, History of Falls Sitting-balance support: Feet supported, No upper extremity supported Sitting balance-Leahy Scale: Fair     Standing balance support: No upper extremity supported, During functional activity Standing balance-Leahy Scale: Fair Standing balance comment: able to complete bathing at sink with no UE support                             Pertinent Vitals/Pain Pain Assessment Pain Assessment: Faces Faces Pain Scale: Hurts little more Pain Location: L hip Pain Descriptors / Indicators: Grimacing Pain Intervention(s): Limited activity within patient's tolerance, Repositioned    Home Living Family/patient expects to be discharged to:: Private residence Living Arrangements: Alone Available Help at Discharge: Family;Available PRN/intermittently;Neighbor  Prior Function Prior Level of Function : Independent/Modified Independent                     Extremity/Trunk Assessment    Upper Extremity Assessment Upper Extremity Assessment: Defer to OT evaluation    Lower Extremity Assessment Lower Extremity Assessment: Generalized weakness;LLE deficits/detail LLE Deficits / Details: deficits consistent with post op pain and weakness    Cervical / Trunk Assessment Cervical / Trunk Assessment: Kyphotic  Communication   Communication Communication: No apparent difficulties  Cognition Arousal: Alert Behavior During Therapy: WFL for tasks assessed/performed Overall Cognitive Status: No family/caregiver present to determine baseline cognitive functioning                                 General Comments: able to answer orientation questions correctly, however in conversation patient thinking she is at home throughout session.        General Comments      Exercises     Assessment/Plan    PT Assessment Patient needs continued PT services  PT Problem List Decreased strength;Decreased activity tolerance;Decreased range of motion;Decreased balance;Decreased mobility;Decreased cognition;Decreased knowledge of use of DME;Decreased safety awareness;Decreased knowledge of precautions;Pain       PT Treatment Interventions DME instruction;Gait training;Functional mobility training;Therapeutic activities;Therapeutic exercise;Balance training;Patient/family education    PT Goals (Current goals can be found in the Care Plan section)  Acute Rehab PT Goals Patient Stated Goal: did not state PT Goal Formulation: Patient unable to participate in goal setting Time For Goal Achievement: 05/22/23 Potential to Achieve Goals: Good    Frequency Min 1X/week     Co-evaluation               AM-PAC PT "6 Clicks" Mobility  Outcome Measure Help needed turning from your back to your side while in a flat bed without using bedrails?: A Little Help needed moving from lying on your back to sitting on the side of a flat bed without using bedrails?: A Little Help  needed moving to and from a bed to a chair (including a wheelchair)?: A Little Help needed standing up from a chair using your arms (e.g., wheelchair or bedside chair)?: A Little Help needed to walk in hospital room?: A Little Help needed climbing 3-5 steps with a railing? : Total 6 Click Score: 16    End of Session   Activity Tolerance: Patient tolerated treatment well Patient left: in chair;with call bell/phone within reach;with chair alarm set Nurse Communication: Mobility status PT Visit Diagnosis: Unsteadiness on feet (R26.81);Muscle weakness (generalized) (M62.81);History of falling (Z91.81);Other abnormalities of gait and mobility (R26.89);Difficulty in walking, not elsewhere classified (R26.2)    Time: 6295-2841 PT Time Calculation (min) (ACUTE ONLY): 33 min   Charges:   PT Evaluation $PT Eval Moderate Complexity: 1 Mod   PT General Charges $$ ACUTE PT VISIT: 1 Visit         Maylon Peppers, PT, DPT Physical Therapist - [facility]  [facility]   Aristotelis Vilardi A [place] Bryngelson 05/08/2023, 10:51 AM

## 2023-05-08 NOTE — Discharge Instructions (Signed)
INSTRUCTIONS AFTER Surgery  Remove items at home which could result in a fall. This includes throw rugs or furniture in walking pathways ICE to the affected joint every three hours while awake for 30 minutes at a time, for at least the first 3-5 days, and then as needed for pain and swelling.  Continue to use ice for pain and swelling. You may notice swelling that will progress down to the foot and ankle.  This is normal after surgery.  Elevate your leg when you are not up walking on it.   Continue to use the breathing machine you got in the hospital (incentive spirometer) which will help keep your temperature down.  It is common for your temperature to cycle up and down following surgery, especially at night when you are not up moving around and exerting yourself.  The breathing machine keeps your lungs expanded and your temperature down.   DIET:  As you were doing prior to hospitalization, we recommend a well-balanced diet.  DRESSING / WOUND CARE / SHOWERING  No showering.  Dressing changes needed.  Patient will follow-up at [facility] clinic orthopedics for staple removal and x-rays of the left hip.  ACTIVITY  Increase activity slowly as tolerated, but follow the weight bearing instructions below.   No driving for 6 weeks or until further direction given by your physician.  You cannot drive while taking narcotics.  No lifting or carrying greater than 10 lbs. until further directed by your surgeon. Avoid periods of inactivity such as sitting longer than an hour when not asleep. This helps prevent blood clots.  You may return to work once you are authorized by your doctor.     WEIGHT BEARING  Weightbearing as tolerated on the left.   EXERCISES Gait training and ambulation training with physical therapy using a walker.  CONSTIPATION  Constipation is defined medically as fewer than three stools per week and severe constipation as less than one stool per week.  Even if you have a regular  bowel pattern at home, your normal regimen is likely to be disrupted due to multiple reasons following surgery.  Combination of anesthesia, postoperative narcotics, change in appetite and fluid intake all can affect your bowels.   YOU MUST use at least one of the following options; they are listed in order of increasing strength to get the job done.  They are all available over the counter, and you may need to use some, POSSIBLY even all of these options:    Drink plenty of fluids (prune juice may be helpful) and high fiber foods Colace 100 mg by mouth twice a day  Senokot for constipation as directed and as needed Dulcolax (bisacodyl), take with full glass of water  Miralax (polyethylene glycol) once or twice a day as needed.  If you have tried all these things and are unable to have a bowel movement in the first 3-4 days after surgery call either your surgeon or your primary doctor.    If you experience loose stools or diarrhea, hold the medications until you stool forms back up.  If your symptoms do not get better within 1 week or if they get worse, check with your doctor.  If you experience "the worst abdominal pain ever" or develop nausea or vomiting, please contact the office immediately for further recommendations for treatment.   ITCHING:  If you experience itching with your medications, try taking only a single pain pill, or even half a pain pill at a time.  You can also use Benadryl over the counter for itching or also to help with sleep.   TED HOSE STOCKINGS:  Use stockings on both legs until for at least 2 weeks or as directed by physician office. They may be removed at night for sleeping.  MEDICATIONS:  See your medication summary on the "After Visit Summary" that nursing will review with you.  You may have some home medications which will be placed on hold until you complete the course of blood thinner medication.  It is important for you to complete the blood thinner medication as  prescribed.  PRECAUTIONS:  If you experience chest pain or shortness of breath - call 911 immediately for transfer to the hospital emergency department.   If you develop a fever greater that 101 F, purulent drainage from wound, increased redness or drainage from wound, foul odor from the wound/dressing, or calf pain - CONTACT YOUR SURGEON.                                                   FOLLOW-UP APPOINTMENTS:  If you do not already have a post-op appointment, please call the office for an appointment to be seen by your surgeon.  Guidelines for how soon to be seen are listed in your "After Visit Summary", but are typically between 1-4 weeks after surgery.  OTHER INSTRUCTIONS:     MAKE SURE YOU:  Understand these instructions.  Get help right away if you are not doing well or get worse.    Thank you for letting us be a part of your medical care team.  It is a privilege we respect greatly.  We hope these instructions will help you stay on track for a fast and full recovery!

## 2023-05-08 NOTE — Progress Notes (Signed)
Initial Nutrition Assessment  DOCUMENTATION CODES:   Not applicable  INTERVENTION:   -Continue Ensure Enlive po BID, each supplement provides 350 kcal and 20 grams of protein.  -Continue MVI with minerals daily   NUTRITION DIAGNOSIS:   Increased nutrient needs related to post-op healing as evidenced by estimated needs.  Ongoing  GOAL:   Patient will meet greater than or equal to 90% of their needs  Progressing   MONITOR:   PO intake, Supplement acceptance, Diet advancement  REASON FOR ASSESSMENT:   Consult Assessment of nutrition requirement/status, Hip fracture protocol  ASSESSMENT:   Pt with PMH of CAD s/p PCI, diabetes, history of colon cancer, HTN, hypothyroidism, and multiple kyphoplasties admitted with lt hip fx s/p fall.  8/22- s/p PROCEDURE: Intramedullary nailing of Left femur with cephalomedullary device   Reviewed I/O's: +1.1 L x 24 hours and +1.6 L since admission  UOP: 250 ml x 24 hours   Spoke with pt and friend at bedside. Pt reports she feels a little sleepy due to medications and work with therapy team early today. Pt shares that she has been feeling poorly the the past week due to COVID and this has caused weakness. Per pt, she was unable to eat any for a week PTA due to nausea and "nothing tasted right". Pt continues to endorse taste changes; noted she consumed 50% of her meal tray. Pt shares that the only that tastes good to her is water. Pt also consumed an Ensure supplement earlier, which she liked.   Per pt, her UBW is around 118-120#. She suspects she has lost weight recently due to COVID and poor oral intake. Reviewed wt hx; wt has been stable over the past year.   Discussed importance of good meal and supplement intake to promote healing. Pt amenable to continue Ensure.   Medications reviewed and include colace.  Per TOC notes, plan to d/c to SNF at discharge for short term rehab.   Labs reviewed: CBGS: 134-222 (inpatient orders for  glycemic control are 0-9 units insulin aspart every 4 hours).    NUTRITION - FOCUSED PHYSICAL EXAM:  Flowsheet Row Most Recent Value  Orbital Region No depletion  Upper Arm Region Mild depletion  Thoracic and Lumbar Region No depletion  Buccal Region No depletion  Temple Region No depletion  Clavicle Bone Region No depletion  Clavicle and Acromion Bone Region No depletion  Scapular Bone Region No depletion  Dorsal Hand Mild depletion  Patellar Region Mild depletion  Anterior Thigh Region Mild depletion  Posterior Calf Region Mild depletion  Edema (RD Assessment) None  Hair Reviewed  Eyes Reviewed  Mouth Reviewed  Skin Reviewed  Nails Reviewed       Diet Order:   Diet Order             Diet regular Room service appropriate? Yes; Fluid consistency: Thin  Diet effective now                   EDUCATION NEEDS:   Education needs have been addressed  Skin:  Skin Assessment: Skin Integrity Issues: Skin Integrity Issues:: Incisions Incisions: closed lt hip  Last BM:  Unknown  Height:   Ht Readings from Last 1 Encounters:  05/06/23 5\' 1"  (1.549 m)    Weight:   Wt Readings from Last 1 Encounters:  05/06/23 55.9 kg    Ideal Body Weight:  47.7 kg  BMI:  Body mass index is 23.29 kg/m.  Estimated Nutritional Needs:   Kcal:  1500-1700  Protein:  75-90 grams  Fluid:  > 1.5 L    Levada Schilling, RD, LDN, CDCES Registered Dietitian II Certified Diabetes Care and Education Specialist Please refer to [facility] for RD and/or RD on-call/weekend/after hours pager

## 2023-05-08 NOTE — Evaluation (Signed)
Occupational Therapy Evaluation Patient Details Name: Rachael Humphrey MRN: [id number] DOB: [birth year] Today's Date: 05/08/2023   History of Present Illness 89 y/o female presented to ED on 05/06/23 after fall while outside watering flowers. Sustained acute mildly impacted comminuted intertrochanteric fracture of L femur. S/p L femur IM nail on 8/22. Tested (+) for COVID. PMH: CAD s/p PCI, hx of colon cancer, HTN, prior falls, hx of kyphoplasty, T2DM   Clinical Impression   Rachael Humphrey was seen for OT evaluation this date. Prior to hospital admission, pt was IND no AD use. Pt lives alone. Pt currently requires MIN A x2 + RW sit<>stand. CGA standing upperbody bathing, don/doff gown, and pericare. MAX A don B socks in sitting. Pt answers orientation questions correctly with prompting but repeatedly states she thinks she is at home (asking to lock the door, grab her cane, look in that hallwasy, etc). Pt would benefit from skilled OT to address noted impairments and functional limitations (see below for any additional details). Upon hospital discharge, recommend OT follow up.    If plan is discharge home, recommend the following: A little help with walking and/or transfers;A little help with bathing/dressing/bathroom;Help with stairs or ramp for entrance    Functional Status Assessment  Patient has had a recent decline in their functional status and demonstrates the ability to make significant improvements in function in a reasonable and predictable amount of time.  Equipment Recommendations  BSC/3in1    Recommendations for Other Services       Precautions / Restrictions Precautions Precautions: Fall Restrictions Weight Bearing Restrictions: Yes LLE Weight Bearing: Weight bearing as tolerated      Mobility Bed Mobility Overal bed mobility: Needs Assistance Bed Mobility: Supine to Sit     Supine to sit: Min assist          Transfers Overall transfer level: Needs  assistance Equipment used: Rolling walker (2 wheels) Transfers: Sit to/from Stand Sit to Stand: Min assist, +2 physical assistance                  Balance Overall balance assessment: Needs assistance Sitting-balance support: Feet supported Sitting balance-Leahy Scale: Fair     Standing balance support: No upper extremity supported, During functional activity Standing balance-Leahy Scale: Fair                             ADL either performed or assessed with clinical judgement   ADL Overall ADL's : Needs assistance/impaired                                       General ADL Comments: CGA standing upperbody bathing, don/doff gown, and pericare. MAX A don B socks in sitting.       Pertinent Vitals/Pain Pain Assessment Pain Assessment: Faces Faces Pain Scale: Hurts little more Pain Location: L hip Pain Descriptors / Indicators: Grimacing Pain Intervention(s): Limited activity within patient's tolerance, Repositioned     Extremity/Trunk Assessment Upper Extremity Assessment Upper Extremity Assessment: Defer to OT evaluation   Lower Extremity Assessment Lower Extremity Assessment: Generalized weakness;LLE deficits/detail LLE Deficits / Details: deficits consistent with post op pain and weakness   Cervical / Trunk Assessment Cervical / Trunk Assessment: Kyphotic   Communication Communication Communication: No apparent difficulties   Cognition Arousal: Alert Behavior During Therapy: WFL for tasks assessed/performed Overall Cognitive Status: No family/caregiver present  to determine baseline cognitive functioning                                 General Comments: answers orientation questions correctly with prompting but repeatedly states she thinks she is at home (asking to lock the door, grab her cane, look in that hallwasy, etc)                Home Living Family/patient expects to be discharged to:: Private  residence Living Arrangements: Alone Available Help at Discharge: Family;Available PRN/intermittently;Neighbor                                    Prior Functioning/Environment Prior Level of Function : Independent/Modified Independent                        OT Problem List: Decreased strength;Decreased range of motion;Decreased activity tolerance;Impaired balance (sitting and/or standing)      OT Treatment/Interventions: Therapeutic exercise;Self-care/ADL training;Energy conservation;DME and/or AE instruction;Therapeutic activities;Patient/family education;Balance training    OT Goals(Current goals can be found in the care plan section) Acute Rehab OT Goals Patient Stated Goal: to go home OT Goal Formulation: With patient Time For Goal Achievement: 05/22/23 Potential to Achieve Goals: Good ADL Goals Pt Will Perform Grooming: with modified independence;standing Pt Will Perform Lower Body Dressing: with modified independence;sit to/from stand Pt Will Transfer to Toilet: with modified independence;ambulating;regular height toilet  OT Frequency: Min 1X/week    Co-evaluation              AM-PAC OT "6 Clicks" Daily Activity     Outcome Measure Help from another person eating meals?: None Help from another person taking care of personal grooming?: A Little Help from another person toileting, which includes using toliet, bedpan, or urinal?: A Little Help from another person bathing (including washing, rinsing, drying)?: A Little Help from another person to put on and taking off regular upper body clothing?: A Little Help from another person to put on and taking off regular lower body clothing?: A Lot 6 Click Score: 18   End of Session Equipment Utilized During Treatment: Rolling walker (2 wheels)  Activity Tolerance: Patient tolerated treatment well Patient left: in chair;with call bell/phone within reach;with chair alarm set;with nursing/sitter in  room  OT Visit Diagnosis: Other abnormalities of gait and mobility (R26.89);Muscle weakness (generalized) (M62.81)                Time: 1610-9604 OT Time Calculation (min): 33 min Charges:  OT General Charges $OT Visit: 1 Visit OT Evaluation $OT Eval Moderate Complexity: 1 Mod OT Treatments $Self Care/Home Management : 8-22 mins  Rachael Humphrey, M.S. OTR/L  05/08/23, 1:28 PM  ascom [phone]

## 2023-05-08 NOTE — TOC Progression Note (Signed)
Transition of Care [facility]) - Progression Note    Patient Details  Name: Rachael Humphrey MRN: [id number] Date of Birth: May 26, 1930  Transition of Care [facility]) CM/SW Contact  Marlowe Sax, RN Phone Number: 05/08/2023, 2:50 PM  Clinical Narrative:    Spoke with the patient in the room, she is agreeable to go to STR, She wants everything to go thru [place], Contact information in the chart.  She wants to stay local Bedsearch sent, the patient is Covid Pos   Expected Discharge Plan: (P) Skilled Nursing Facility    Expected Discharge Plan and Services                                               Social Determinants of Health (SDOH) Interventions SDOH Screenings   Food Insecurity: No Food Insecurity (05/07/2023)  Housing: Low Risk  (05/07/2023)  Transportation Needs: No Transportation Needs (05/07/2023)  Utilities: Not At Risk (05/07/2023)  Tobacco Use: Low Risk  (05/07/2023)    Readmission Risk Interventions     No data to display

## 2023-05-08 NOTE — Plan of Care (Signed)
  Problem: Respiratory: Goal: Will maintain a patent airway Outcome: Progressing   Problem: Health Behavior/Discharge Planning: Goal: Ability to manage health-related needs will improve Outcome: Progressing   Problem: Clinical Measurements: Goal: Respiratory complications will improve Outcome: Progressing Goal: Cardiovascular complication will be avoided Outcome: Progressing   Problem: Activity: Goal: Risk for activity intolerance will decrease Outcome: Progressing   Problem: Nutrition: Goal: Adequate nutrition will be maintained Outcome: Progressing   Problem: Elimination: Goal: Will not experience complications related to bowel motility Outcome: Progressing Goal: Will not experience complications related to urinary retention Outcome: Progressing   Problem: Pain Managment: Goal: General experience of comfort will improve Outcome: Progressing

## 2023-05-08 NOTE — [place] FL2 (Signed)
[place] MEDICAID FL2 LEVEL OF CARE FORM     IDENTIFICATION  Patient Name: Rachael Humphrey Birthdate: 1929/12/26 Sex: female Admission Date (Current Location): 05/06/2023  [facility] and [place] Number:  Chiropodist and Address:  [facility], [address], [place], [place] [address]      Provider Number: [id number]  Attending Physician Name and Address:  Sunnie Nielsen, DO  Relative Name and Phone Number:  Lucendia Herrlich [phone]    Current Level of Care: Hospital Recommended Level of Care: Skilled Nursing Facility Prior Approval Number:    Date Approved/Denied:   PASRR Number: [id number]  Discharge Plan: SNF    Current Diagnoses: Patient Active Problem List   Diagnosis Date Noted   COVID-19 virus infection 05/06/2023   Closed comminuted intertrochanteric fracture of proximal end of left femur, initial encounter (HCC) 05/06/2023   Compression fracture of L2 (HCC) 03/22/2022   Back pain 11/26/2020   Closed compression fracture of L4 lumbar vertebra, initial encounter (HCC) 11/26/2020   Closed patellar sleeve fracture of right knee 04/08/2019   Accidental fall 02/12/2017   Acquired hypothyroidism 10/06/2016   Arm fracture 10/06/2016   Benign neoplasm of colon 10/06/2016   Cervical adenopathy 10/06/2016   Benign essential hypertension 10/06/2016   GERD (gastroesophageal reflux disease) 10/06/2016   Hiatal hernia 10/06/2016   History of abnormal cervical Pap smear 10/06/2016   History of TIA (transient ischemic attack) 10/06/2016   Hyperlipidemia, unspecified 10/06/2016   Nephrolithiasis 10/06/2016   Senile osteoporosis 10/06/2016   Type 2 diabetes mellitus without complication (HCC) 10/06/2016   Rupture of implant of right breast 09/18/2015   Chronic back pain 03/08/2014   Compression fracture 03/08/2014   Coronary artery disease 03/03/2014   Wedge compression fracture of T7 vertebra (HCC) 02/22/2014    Orientation RESPIRATION  BLADDER Height & Weight     Self, Time, Situation, Place  Normal Continent, External catheter Weight: 55.9 kg Height:  5\' 1"  (154.9 cm)  BEHAVIORAL SYMPTOMS/MOOD NEUROLOGICAL BOWEL NUTRITION STATUS      Continent Diet (See DC summary)  AMBULATORY STATUS COMMUNICATION OF NEEDS Skin   Extensive Assist Verbally Normal, Surgical wounds                       Personal Care Assistance Level of Assistance  Bathing, Feeding Bathing Assistance: Maximum assistance Feeding assistance: Independent       Functional Limitations Info  Sight, Hearing, Speech Sight Info: Adequate Hearing Info: Impaired Speech Info: Adequate    SPECIAL CARE FACTORS FREQUENCY  PT (By licensed PT), OT (By licensed OT)     PT Frequency: 5 times per week OT Frequency: 5 times per week            Contractures Contractures Info: Not present    Additional Factors Info  Allergies, Code Status Code Status Info: DNR Allergies Info: Codeine, Darvon (Propoxyphene), Demerol (Meperidine), Gabapentin, Sulfa Antibiotics, Tramadol, Voltaren (Diclofenac Sodium), Zinc           Current Medications (05/08/2023):  This is the current hospital active medication list Current Facility-Administered Medications  Medication Dose Route Frequency Provider Last Rate Last Admin   0.9 %  sodium chloride infusion   Intravenous Continuous Signa Kell, MD 75 mL/hr at 05/08/23 0303 Infusion Verify at 05/08/23 0303   acetaminophen (TYLENOL) tablet 1,000 mg  1,000 mg Oral Q8H Signa Kell, MD   1,000 mg at 05/08/23 0934   albuterol (VENTOLIN HFA) 108 (90 Base) MCG/ACT inhaler 1  puff  1 puff Inhalation Q4H PRN Andris Baumann, MD       aspirin EC tablet 325 mg  325 mg Oral Daily Signa Kell, MD   325 mg at 05/08/23 0934   atorvastatin (LIPITOR) tablet 20 mg  20 mg Oral Daily Andris Baumann, MD   20 mg at 05/08/23 0935   bisacodyl (DULCOLAX) suppository 10 mg  10 mg Rectal Daily PRN Signa Kell, MD       calcium-vitamin D (OSCAL  WITH D) 500-5 MG-MCG per tablet 1 tablet  1 tablet Oral Q breakfast Signa Kell, MD   1 tablet at 05/08/23 4696   clopidogrel (PLAVIX) tablet 75 mg  75 mg Oral Daily Signa Kell, MD   75 mg at 05/08/23 2952   docusate sodium (COLACE) capsule 100 mg  100 mg Oral BID Signa Kell, MD   100 mg at 05/08/23 0934   enoxaparin (LOVENOX) injection 30 mg  30 mg Subcutaneous Q24H Sunnie Nielsen, DO       feeding supplement (ENSURE ENLIVE / ENSURE PLUS) liquid 237 mL  237 mL Oral BID BM Signa Kell, MD       fluticasone (FLONASE) 50 MCG/ACT nasal spray 2 spray  2 spray Each Nare Daily PRN Signa Kell, MD       guaiFENesin-dextromethorphan (ROBITUSSIN DM) 100-10 MG/5ML syrup 5 mL  5 mL Oral Q4H PRN Andris Baumann, MD       HYDROmorphone (DILAUDID) injection 0.2-0.4 mg  0.2-0.4 mg Intravenous Q4H PRN Signa Kell, MD       insulin aspart (novoLOG) injection 0-9 Units  0-9 Units Subcutaneous Q4H Andris Baumann, MD   2 Units at 05/08/23 1254   levothyroxine (SYNTHROID) tablet 25 mcg  25 mcg Oral Q0600 Andris Baumann, MD   25 mcg at 05/08/23 0552   loratadine (CLARITIN) tablet 10 mg  10 mg Oral Daily Signa Kell, MD   10 mg at 05/08/23 0934   methocarbamol (ROBAXIN) tablet 500 mg  500 mg Oral Q6H PRN Andris Baumann, MD   500 mg at 05/08/23 8413   Or   methocarbamol (ROBAXIN) 500 mg in dextrose 5 % 50 mL IVPB  500 mg Intravenous Q6H PRN Andris Baumann, MD       metoCLOPramide (REGLAN) tablet 5-10 mg  5-10 mg Oral Q8H PRN Signa Kell, MD       Or   metoCLOPramide (REGLAN) injection 5-10 mg  5-10 mg Intravenous Q8H PRN Signa Kell, MD       metoprolol succinate (TOPROL-XL) 24 hr tablet 50 mg  50 mg Oral q morning Sunnie Nielsen, DO   50 mg at 05/08/23 0934   montelukast (SINGULAIR) tablet 10 mg  10 mg Oral QHS Signa Kell, MD   10 mg at 05/07/23 2203   multivitamin with minerals tablet 1 tablet  1 tablet Oral Daily Signa Kell, MD   1 tablet at 05/08/23 0934   NIFEdipine  (PROCARDIA-XL/NIFEDICAL-XL) 24 hr tablet 30 mg  30 mg Oral Daily Signa Kell, MD   30 mg at 05/08/23 0934   ondansetron (ZOFRAN) tablet 4 mg  4 mg Oral Q6H PRN Signa Kell, MD       Or   ondansetron [facility]) injection 4 mg  4 mg Intravenous Q6H PRN Signa Kell, MD       oxyCODONE (Oxy IR/ROXICODONE) immediate release tablet 2.5-5 mg  2.5-5 mg Oral Q4H PRN Signa Kell, MD       oxyCODONE (Oxy IR/ROXICODONE)  immediate release tablet 5-10 mg  5-10 mg Oral Q4H PRN Signa Kell, MD       pantoprazole (PROTONIX) EC tablet 40 mg  40 mg Oral BID Signa Kell, MD   40 mg at 05/08/23 1610   senna-docusate (Senokot-S) tablet 1 tablet  1 tablet Oral QHS PRN Signa Kell, MD       sodium phosphate (FLEET) enema 1 enema  1 enema Rectal Once PRN Signa Kell, MD         Discharge Medications: Please see discharge summary for a list of discharge medications.  Relevant Imaging Results:  Relevant Lab Results:   Additional Information SS# [id number]  Marlowe Sax, RN

## 2023-05-08 NOTE — Progress Notes (Signed)
PROGRESS NOTE    Rachael Humphrey   GMW:[id number] DOB: 04-08-1930  DOA: 05/06/2023 Date of Service: 05/08/23 PCP: Marguarite Arbour, MD     Brief Narrative / Hospital Course:   Rachael Humphrey is a 89 y.o. female with medical history significant for CAD s/p PCI, history of colon cancer, HTN, hypothyroidism, kyphoplasty and prior falls, who presents to the ED following an accidental fall while gardening.  Patient was recently diagnosed with COVID after having some mild respiratory symptoms and was improving and felt well enough to go out gardening.  She fell as she tried to turn around.  08/21: to ED, (+)acute mildly impacted comminuted intertrochanteric fracture of the left femur. Admitted to hospitalist service. Ortho planning surgery  08/22: to OR for intramedullary nail L fenur fx.  08/23 (Friday): PT/OT recs for SNF rehab, TOC to work on this, expect pt will be here thru the weekend   Consultants:  Orthopedic surgery   Procedures: 05/07/23 intramedullary nail L femur fracture       ASSESSMENT & PLAN:   Principal Problem:   Closed comminuted intertrochanteric fracture of proximal end of left femur, initial encounter (HCC) Active Problems:   Accidental fall   COVID-19 virus infection   Acquired hypothyroidism   Coronary artery disease   Benign essential hypertension   History of TIA (transient ischemic attack)   Type 2 diabetes mellitus without complication (HCC)   Closed comminuted intertrochanteric fracture of proximal end of left femur, initial encounter (HCC) Accidental fall Preoperative clearance - At low/optimized risk of perioperative cardiopulmonary events Patient has a history of CAD and has been symptom-free.  Last saw her cardiologist in February 2024 and was given a 26-month follow-up.  Also has a history of prior TIA maintained on Plavix and atorvastatin Orthopedics plan to OR for intramedullary nail placement  hold Plavix for surgery, restart today  Pain  control  Recent diagnosis of COVID  just over a week ago but symptomatic only for weakness that has been improving Respiratory precautions   Acquired hypothyroidism Continue levothyroxine  Coronary artery disease Patient with no anginal pain.  EKG nonacute Continue atorvastatin and metoprolol Reduced metoprolol d/t borderline low BP this morning  held Plavix for surgery, restarted today   Benign essential hypertension Blood pressure controlled Reduced metoprolol d/t borderline low BP this morning   History of TIA (transient ischemic attack) Continue atorvastatin, plavix   Type 2 diabetes mellitus without complication (HCC) Sliding scale insulin coverage    DVT prophylaxis: lovenox  Pertinent IV fluids/nutrition: IV fluids not needed once taking po  Central lines / invasive devices: none  Code Status: DNR ACP documentation reviewed: none on file   Current Admission Status: inpatient  TOC needs / Dispo plan: SNF rehab Barriers to discharge / significant pending items: will need SNF and expect she will be here thru the weekend pending placement              Subjective / Brief ROS:  Patient reports feeling tired, she hurts but pain meds are working ok  Denies CP/SOB.  Denies new weakness.  Reports no concerns w/ urination/defecation.   Family Communication: family at bedside on rounds     Objective Findings:  Vitals:   05/08/23 0030 05/08/23 0407 05/08/23 1008 05/08/23 1226  BP: 108/69 129/70 100/67 101/69  Pulse: 83 81 84 80  Resp: 17 16 17 16   Temp: 98 F (36.7 C) 98 F (36.7 C) 97.9 F (36.6 C) 97.8 F (36.6 C)  TempSrc: Oral Oral    SpO2: 94% 96% 96% 99%  Weight:      Height:        Intake/Output Summary (Last 24 hours) at 05/08/2023 1356 Last data filed at 05/08/2023 1228 Gross per 24 hour  Intake 1603.17 ml  Output 560 ml  Net 1043.17 ml   Filed Weights   05/06/23 1939  Weight: 55.9 kg    Examination:  Physical  Exam Constitutional:      General: She is not in acute distress. Cardiovascular:     Rate and Rhythm: Normal rate and regular rhythm.  Pulmonary:     Effort: Pulmonary effort is normal.     Breath sounds: Normal breath sounds.  Abdominal:     Palpations: Abdomen is soft.  Musculoskeletal:     Right lower leg: No edema.     Left lower leg: No edema.  Neurological:     Mental Status: She is alert. Mental status is at baseline.  Psychiatric:        Mood and Affect: Mood normal.        Behavior: Behavior normal.          Scheduled Medications:   acetaminophen  1,000 mg Oral Q8H   aspirin EC  325 mg Oral Daily   atorvastatin  20 mg Oral Daily   calcium-vitamin D  1 tablet Oral Q breakfast   clopidogrel  75 mg Oral Daily   docusate sodium  100 mg Oral BID   enoxaparin (LOVENOX) injection  30 mg Subcutaneous Q24H   feeding supplement  237 mL Oral BID BM   insulin aspart  0-9 Units Subcutaneous Q4H   levothyroxine  25 mcg Oral Q0600   loratadine  10 mg Oral Daily   metoprolol succinate  50 mg Oral q morning   montelukast  10 mg Oral QHS   multivitamin with minerals  1 tablet Oral Daily   NIFEdipine  30 mg Oral Daily   pantoprazole  40 mg Oral BID    Continuous Infusions:  sodium chloride 75 mL/hr at 05/08/23 0303   methocarbamol (ROBAXIN) IV      PRN Medications:  albuterol, bisacodyl, fluticasone, guaiFENesin-dextromethorphan, HYDROmorphone (DILAUDID) injection, methocarbamol **OR** methocarbamol (ROBAXIN) IV, metoCLOPramide **OR** metoCLOPramide (REGLAN) injection, ondansetron **OR** ondansetron (ZOFRAN) IV, oxyCODONE, oxyCODONE, senna-docusate, sodium phosphate  Antimicrobials from admission:  Anti-infectives (From admission, onward)    Start     Dose/Rate Route Frequency Ordered Stop   05/07/23 2000  ceFAZolin (ANCEF) IVPB 2g/100 mL premix        2 g 200 mL/hr over 30 Minutes Intravenous Every 6 hours 05/07/23 1535 05/08/23 1009   05/07/23 1130  ceFAZolin  (ANCEF) IVPB 2g/100 mL premix        2 g 200 mL/hr over 30 Minutes Intravenous On call to O.R. 05/06/23 2207 05/07/23 1206           Data Reviewed:  I have personally reviewed the following...  CBC: Recent Labs  Lab 05/06/23 2027 05/08/23 0548  WBC 6.9 9.7  NEUTROABS 4.5  --   HGB 13.8 11.2*  HCT 39.7 32.4*  MCV 93.4 91.0  PLT 140* 128*   Basic Metabolic Panel: Recent Labs  Lab 05/06/23 2027 05/08/23 0548  NA 135 136  K 4.0 3.6  CL 106 99  CO2 21* 24  GLUCOSE 160* 124*  BUN 17 16  CREATININE 0.62 0.65  CALCIUM 8.3* 8.6*   GFR: Estimated Creatinine Clearance: 34.6 mL/min (by C-G formula based on  SCr of 0.65 mg/dL). Liver Function Tests: No results for input(s): "AST", "ALT", "ALKPHOS", "BILITOT", "PROT", "ALBUMIN" in the last 168 hours. No results for input(s): "LIPASE", "AMYLASE" in the last 168 hours. No results for input(s): "AMMONIA" in the last 168 hours. Coagulation Profile: No results for input(s): "INR", "PROTIME" in the last 168 hours. Cardiac Enzymes: No results for input(s): "CKTOTAL", "CKMB", "CKMBINDEX", "TROPONINI" in the last 168 hours. BNP (last 3 results) No results for input(s): "PROBNP" in the last 8760 hours. HbA1C: Recent Labs    05/06/23 2027  HGBA1C 6.6*   CBG: Recent Labs  Lab 05/07/23 2001 05/08/23 0033 05/08/23 0409 05/08/23 0849 05/08/23 1222  GLUCAP 222* 167* 102* 134* 166*   Lipid Profile: No results for input(s): "CHOL", "HDL", "LDLCALC", "TRIG", "CHOLHDL", "LDLDIRECT" in the last 72 hours. Thyroid Function Tests: No results for input(s): "TSH", "T4TOTAL", "FREET4", "T3FREE", "THYROIDAB" in the last 72 hours. Anemia Panel: No results for input(s): "VITAMINB12", "FOLATE", "FERRITIN", "TIBC", "IRON", "RETICCTPCT" in the last 72 hours. Most Recent Urinalysis On File:     Component Value Date/Time   COLORURINE YELLOW (A) 05/06/2023 2027   APPEARANCEUR CLEAR (A) 05/06/2023 2027   APPEARANCEUR Clear 04/01/2014 0246    LABSPEC 1.009 05/06/2023 2027   LABSPEC 1.006 04/01/2014 0246   PHURINE 6.0 05/06/2023 2027   GLUCOSEU 50 (A) 05/06/2023 2027   GLUCOSEU >=500 04/01/2014 0246   HGBUR NEGATIVE 05/06/2023 2027   BILIRUBINUR NEGATIVE 05/06/2023 2027   BILIRUBINUR Negative 04/01/2014 0246   KETONESUR NEGATIVE 05/06/2023 2027   PROTEINUR NEGATIVE 05/06/2023 2027   NITRITE NEGATIVE 05/06/2023 2027   LEUKOCYTESUR MODERATE (A) 05/06/2023 2027   LEUKOCYTESUR Negative 04/01/2014 0246   Sepsis Labs: @LABRCNTIP (procalcitonin:4,lacticidven:4) Microbiology: Recent Results (from the past 240 hour(s))  SARS Coronavirus 2 by RT PCR (hospital order, performed in [facility] hospital lab) *cepheid single result test* Urine, Clean Catch     Status: Abnormal   Collection Time: 05/06/23  8:27 PM   Specimen: Urine, Clean Catch; Nasal Swab  Result Value Ref Range Status   SARS Coronavirus 2 by RT PCR POSITIVE (A) NEGATIVE Final    Comment: (NOTE) SARS-CoV-2 target nucleic acids are DETECTED  SARS-CoV-2 RNA is generally detectable in upper respiratory specimens  during the acute phase of infection.  Positive results are indicative  of the presence of the identified virus, but do not rule out bacterial infection or co-infection with other pathogens not detected by the test.  Clinical correlation with patient history and  other diagnostic information is necessary to determine patient infection status.  The expected result is negative.  Fact Sheet for Patients:   RoadLapTop.co.za   Fact Sheet for Healthcare Providers:   http://kim-miller.com/    This test is not yet approved or cleared by the [place] FDA and  has been authorized for detection and/or diagnosis of SARS-CoV-2 by FDA under an Emergency Use Authorization (EUA).  This EUA will remain in effect (meaning this test can be used) for the duration of  the COVID-19 declaration under Section 564(b)(1)  of the  Act, 21 U.S.C. section 360-bbb-3(b)(1), unless the authorization is terminated or revoked sooner.   Performed at [facility], [address], [place], [place] [address]       Radiology Studies last 3 days: DG HIP UNILAT WITH PELVIS 2-3 VIEWS LEFT  Result Date: 05/07/2023 CLINICAL DATA:  Surgical internal fixation of proximal left femoral fracture. EXAM: DG HIP (WITH OR WITHOUT PELVIS) 2-3V LEFT; DG C-ARM 1-60 MIN-NO REPORT Fluoroscopy  time: 1 minute 24 seconds. COMPARISON:  May 06, 2023. FINDINGS: Six intraoperative fluoroscopic images were obtained the left hip during surgical internal fixation of proximal left femoral fracture. IMPRESSION: Fluoroscopic guidance provided during surgical internal fixation of proximal left femoral fracture. Electronically Signed   By: Lupita Raider M.D.   On: 05/07/2023 15:02   DG C-Arm 1-60 Min-No Report  Result Date: 05/07/2023 Fluoroscopy was utilized by the requesting physician.  No radiographic interpretation.   CT Hip Left Wo Contrast  Result Date: 05/06/2023 CLINICAL DATA:  Hip fracture EXAM: CT OF THE LEFT HIP WITHOUT CONTRAST TECHNIQUE: Multidetector CT imaging of the left hip was performed according to the standard protocol. Multiplanar CT image reconstructions were also generated. RADIATION DOSE REDUCTION: This exam was performed according to the departmental dose-optimization program which includes automated exposure control, adjustment of the mA and/or kV according to patient size and/or use of iterative reconstruction technique. COMPARISON:  Left hip x-ray same day FINDINGS: Bones/Joint/Cartilage The bones are diffusely osteopenic. There is an acute mildly impacted comminuted intratrochanteric fracture of the left femur. There is mild apex anterior angulation. There is no dislocation. No focal osseous lesion identified. Ligaments Suboptimally assessed by CT. Muscles and Tendons There is intramuscular edema surrounding the fracture  site. No definitive hematoma. Soft tissues Colonic diverticulosis present. No focal hematoma or fluid collection. IMPRESSION: Acute mildly impacted comminuted intratrochanteric fracture of the left femur. Electronically Signed   By: Darliss Cheney M.D.   On: 05/06/2023 23:04   DG Chest Portable 1 View  Result Date: 05/06/2023 CLINICAL DATA:  Ground level fall and left hip pain.  Preop. EXAM: PORTABLE CHEST 1 VIEW COMPARISON:  07/26/2022 FINDINGS: Stable cardiomediastinal silhouette. Aortic atherosclerotic calcification. Bibasilar atelectasis/scarring. No focal consolidation, pleural effusion, or pneumothorax. No displaced rib fractures. Moderate hiatal hernia. IMPRESSION: No acute cardiopulmonary disease. Moderate hiatal hernia. Electronically Signed   By: Minerva Fester M.D.   On: 05/06/2023 22:33   DG Hip Unilat W or Wo Pelvis 2-3 Views Left  Result Date: 05/06/2023 CLINICAL DATA:  Fall, left hip pain EXAM: DG HIP (WITH OR WITHOUT PELVIS) 2-3V LEFT COMPARISON:  None Available. FINDINGS: The osseous structures are diffusely markedly osteopenic. There is an acute, impacted basicervical/intratrochanteric fracture of the left hip with probable cortical avulsion of the lesser trochanter. Femoral head is still seated within the left acetabulum. Mild bilateral degenerative hip arthritis. Degenerative changes are seen within the sacroiliac joints. No other fracture identified. L4 vertebroplasty has been performed. IMPRESSION: 1. Acute, impacted basicervical/intratrochanteric fracture of the left hip. Electronically Signed   By: Helyn Numbers M.D.   On: 05/06/2023 21:34             LOS: 2 days      Sunnie Nielsen, DO [facility] 05/08/2023, 1:56 PM    Dictation software may have been used to generate the above note. Typos may occur and escape review in typed/dictated notes. Please contact Dr Lyn Hollingshead directly for clarity if needed.  Staff may message me via secure chat in Epic   but this may not receive an immediate response,  please page me for urgent matters!  If 7PM-7AM, please contact night coverage [email or web address]

## 2023-05-09 DIAGNOSIS — S72142A Displaced intertrochanteric fracture of left femur, initial encounter for closed fracture: Secondary | ICD-10-CM | POA: Diagnosis not present

## 2023-05-09 LAB — BASIC METABOLIC PANEL
Anion gap: 7 (ref 5–15)
BUN: 14 mg/dL (ref 8–23)
CO2: 24 mmol/L (ref 22–32)
Calcium: 8.9 mg/dL (ref 8.9–10.3)
Chloride: 104 mmol/L (ref 98–111)
Creatinine, Ser: 0.61 mg/dL (ref 0.44–1.00)
GFR, Estimated: >60 mL/min (ref >60)
Glucose, Bld: 165 mg/dL — ABNORMAL HIGH (ref 70–99)
Potassium: 4 mmol/L (ref 3.5–5.1)
Sodium: 135 mmol/L (ref 135–145)

## 2023-05-09 LAB — CBC
HCT: 29.6 % — ABNORMAL LOW (ref 36.0–46.0)
Hemoglobin: 10.7 g/dL — ABNORMAL LOW (ref 12.0–15.0)
MCH: 32.6 pg (ref 26.0–34.0)
MCHC: 36.1 g/dL — ABNORMAL HIGH (ref 30.0–36.0)
MCV: 90.2 fL (ref 80.0–100.0)
Platelets: 120 10*3/uL — ABNORMAL LOW (ref 150–400)
RBC: 3.28 MIL/uL — ABNORMAL LOW (ref 3.87–5.11)
RDW: 12.6 % (ref 11.5–15.5)
WBC: 8.9 10*3/uL (ref 4.0–10.5)
nRBC: 0 % (ref 0.0–0.2)

## 2023-05-09 LAB — GLUCOSE, CAPILLARY
Glucose-Capillary: 123 mg/dL — ABNORMAL HIGH (ref 70–99)
Glucose-Capillary: 125 mg/dL — ABNORMAL HIGH (ref 70–99)
Glucose-Capillary: 153 mg/dL — ABNORMAL HIGH (ref 70–99)
Glucose-Capillary: 161 mg/dL — ABNORMAL HIGH (ref 70–99)
Glucose-Capillary: 191 mg/dL — ABNORMAL HIGH (ref 70–99)
Glucose-Capillary: 194 mg/dL — ABNORMAL HIGH (ref 70–99)

## 2023-05-09 NOTE — Progress Notes (Signed)
PROGRESS NOTE    [name]   ZOX:[id number] DOB: Aug 25, 1930  DOA: 05/06/2023 Date of Service: 05/09/23 PCP: Marguarite Arbour, MD     Brief Narrative / Hospital Course:   Rachael Humphrey is a 89 y.o. female with medical history significant for CAD s/p PCI, history of colon cancer, HTN, hypothyroidism, kyphoplasty and prior falls, who presents to the ED following an accidental fall while gardening.  Patient was recently diagnosed with COVID after having some mild respiratory symptoms and was improving and felt well enough to go out gardening.  She fell as she tried to turn around.  08/21: to ED, (+)acute mildly impacted comminuted intertrochanteric fracture of the left femur. Admitted to hospitalist service. Ortho planning surgery  08/22: to OR for intramedullary nail L fenur fx.  08/23 (Friday): PT/OT recs for SNF rehab, TOC to work on this, expect pt will be here thru the weekend  08/24: stable, no concerns   Consultants:  Orthopedic surgery   Procedures: 05/07/23 intramedullary nail L femur fracture       ASSESSMENT & PLAN:   Principal Problem:   Closed comminuted intertrochanteric fracture of proximal end of left femur, initial encounter (HCC) Active Problems:   Accidental fall   COVID-19 virus infection   Acquired hypothyroidism   Coronary artery disease   Benign essential hypertension   History of TIA (transient ischemic attack)   Type 2 diabetes mellitus without complication (HCC)   Closed comminuted intertrochanteric fracture of proximal end of left femur, initial encounter (HCC) Accidental fall Preoperative clearance - At low/optimized risk of perioperative cardiopulmonary events Patient has a history of CAD and has been symptom-free.  Last saw her cardiologist in February 2024 and was given a 42-month follow-up.  Also has a history of prior TIA maintained on Plavix and atorvastatin S/p intramedullary nail placement  Pain control SNF rehab  Ortho follow-up    Recent diagnosis of COVID  just over a week ago but symptomatic only for weakness that has been improving Respiratory precautions   Acquired hypothyroidism Continue levothyroxine  Coronary artery disease Patient with no anginal pain.  EKG nonacute Continue atorvastatin and metoprolol Reduced metoprolol d/t borderline low BP this morning  held Plavix for surgery, restarted today   Benign essential hypertension Blood pressure controlled Reduced metoprolol d/t borderline low BP this morning   History of TIA (transient ischemic attack) Continue atorvastatin, plavix   Type 2 diabetes mellitus without complication (HCC) Sliding scale insulin coverage    DVT prophylaxis: lovenox  Pertinent IV fluids/nutrition: IV fluids not needed once taking po  Central lines / invasive devices: none  Code Status: DNR ACP documentation reviewed: none on file   Current Admission Status: inpatient  TOC needs / Dispo plan: SNF rehab Barriers to discharge / significant pending items: will need SNF and expect she will be here thru the weekend pending placement              Subjective / Brief ROS:  Patient reports feeling a bit better today Denies CP/SOB.   Family Communication: family at bedside on rounds     Objective Findings:  Vitals:   05/08/23 1744 05/09/23 0138 05/09/23 0416 05/09/23 0850  BP: (!) 98/54 117/65 116/80 102/80  Pulse: 91 95 100 95  Resp: 16 18 18 16   Temp: 98 F (36.7 C) 98.4 F (36.9 C) 98.2 F (36.8 C) 98.7 F (37.1 C)  TempSrc: Oral  Oral Oral  SpO2: 97% 97% 97% 99%  Weight:  Height:        Intake/Output Summary (Last 24 hours) at 05/09/2023 1424 Last data filed at 05/09/2023 0420 Gross per 24 hour  Intake --  Output 200 ml  Net -200 ml   Filed Weights   05/06/23 1939  Weight: 55.9 kg    Examination:  Physical Exam Constitutional:      General: She is not in acute distress. Cardiovascular:     Rate and Rhythm: Normal rate and  regular rhythm.  Pulmonary:     Effort: Pulmonary effort is normal.     Breath sounds: Normal breath sounds.  Abdominal:     Palpations: Abdomen is soft.  Musculoskeletal:     Right lower leg: No edema.     Left lower leg: No edema.  Neurological:     Mental Status: She is alert. Mental status is at baseline.  Psychiatric:        Mood and Affect: Mood normal.        Behavior: Behavior normal.          Scheduled Medications:   acetaminophen  1,000 mg Oral Q8H   aspirin EC  325 mg Oral Daily   atorvastatin  20 mg Oral Daily   calcium-vitamin D  1 tablet Oral Q breakfast   clopidogrel  75 mg Oral Daily   docusate sodium  100 mg Oral BID   enoxaparin (LOVENOX) injection  30 mg Subcutaneous Q24H   feeding supplement  237 mL Oral BID BM   insulin aspart  0-9 Units Subcutaneous Q4H   levothyroxine  25 mcg Oral Q0600   loratadine  10 mg Oral Daily   metoprolol succinate  50 mg Oral q morning   montelukast  10 mg Oral QHS   multivitamin with minerals  1 tablet Oral Daily   NIFEdipine  30 mg Oral Daily   pantoprazole  40 mg Oral BID    Continuous Infusions:  sodium chloride 75 mL/hr at 05/08/23 0303   methocarbamol (ROBAXIN) IV      PRN Medications:  albuterol, bisacodyl, fluticasone, guaiFENesin-dextromethorphan, HYDROmorphone (DILAUDID) injection, methocarbamol **OR** methocarbamol (ROBAXIN) IV, metoCLOPramide **OR** metoCLOPramide (REGLAN) injection, ondansetron **OR** ondansetron (ZOFRAN) IV, oxyCODONE, oxyCODONE, senna-docusate, sodium phosphate  Antimicrobials from admission:  Anti-infectives (From admission, onward)    Start     Dose/Rate Route Frequency Ordered Stop   05/07/23 2000  ceFAZolin (ANCEF) IVPB 2g/100 mL premix        2 g 200 mL/hr over 30 Minutes Intravenous Every 6 hours 05/07/23 1535 05/08/23 1533   05/07/23 1130  ceFAZolin (ANCEF) IVPB 2g/100 mL premix        2 g 200 mL/hr over 30 Minutes Intravenous On call to O.R. 05/06/23 2207 05/08/23 1533            Data Reviewed:  I have personally reviewed the following...  CBC: Recent Labs  Lab 05/06/23 2027 05/08/23 0548 05/09/23 0536  WBC 6.9 9.7 8.9  NEUTROABS 4.5  --   --   HGB 13.8 11.2* 10.7*  HCT 39.7 32.4* 29.6*  MCV 93.4 91.0 90.2  PLT 140* 128* 120*   Basic Metabolic Panel: Recent Labs  Lab 05/06/23 2027 05/08/23 0548 05/09/23 0536  NA 135 136 135  K 4.0 3.6 4.0  CL 106 99 104  CO2 21* 24 24  GLUCOSE 160* 124* 165*  BUN 17 16 14   CREATININE 0.62 0.65 0.61  CALCIUM 8.3* 8.6* 8.9   GFR: Estimated Creatinine Clearance: 34.6 mL/min (by C-G formula based  on SCr of 0.61 mg/dL). Liver Function Tests: No results for input(s): "AST", "ALT", "ALKPHOS", "BILITOT", "PROT", "ALBUMIN" in the last 168 hours. No results for input(s): "LIPASE", "AMYLASE" in the last 168 hours. No results for input(s): "AMMONIA" in the last 168 hours. Coagulation Profile: No results for input(s): "INR", "PROTIME" in the last 168 hours. Cardiac Enzymes: No results for input(s): "CKTOTAL", "CKMB", "CKMBINDEX", "TROPONINI" in the last 168 hours. BNP (last 3 results) No results for input(s): "PROBNP" in the last 8760 hours. HbA1C: Recent Labs    05/06/23 2027  HGBA1C 6.6*   CBG: Recent Labs  Lab 05/08/23 2014 05/09/23 0029 05/09/23 0414 05/09/23 0838 05/09/23 1209  GLUCAP 134* 123* 161* 153* 194*   Lipid Profile: No results for input(s): "CHOL", "HDL", "LDLCALC", "TRIG", "CHOLHDL", "LDLDIRECT" in the last 72 hours. Thyroid Function Tests: No results for input(s): "TSH", "T4TOTAL", "FREET4", "T3FREE", "THYROIDAB" in the last 72 hours. Anemia Panel: No results for input(s): "VITAMINB12", "FOLATE", "FERRITIN", "TIBC", "IRON", "RETICCTPCT" in the last 72 hours. Most Recent Urinalysis On File:     Component Value Date/Time   COLORURINE YELLOW (A) 05/06/2023 2027   APPEARANCEUR CLEAR (A) 05/06/2023 2027   APPEARANCEUR Clear 04/01/2014 0246   LABSPEC 1.009 05/06/2023 2027    LABSPEC 1.006 04/01/2014 0246   PHURINE 6.0 05/06/2023 2027   GLUCOSEU 50 (A) 05/06/2023 2027   GLUCOSEU >=500 04/01/2014 0246   HGBUR NEGATIVE 05/06/2023 2027   BILIRUBINUR NEGATIVE 05/06/2023 2027   BILIRUBINUR Negative 04/01/2014 0246   KETONESUR NEGATIVE 05/06/2023 2027   PROTEINUR NEGATIVE 05/06/2023 2027   NITRITE NEGATIVE 05/06/2023 2027   LEUKOCYTESUR MODERATE (A) 05/06/2023 2027   LEUKOCYTESUR Negative 04/01/2014 0246   Sepsis Labs: @LABRCNTIP (procalcitonin:4,lacticidven:4) Microbiology: Recent Results (from the past 240 hour(s))  SARS Coronavirus 2 by RT PCR (hospital order, performed in [facility] hospital lab) *cepheid single result test* Urine, Clean Catch     Status: Abnormal   Collection Time: 05/06/23  8:27 PM   Specimen: Urine, Clean Catch; Nasal Swab  Result Value Ref Range Status   SARS Coronavirus 2 by RT PCR POSITIVE (A) NEGATIVE Final    Comment: (NOTE) SARS-CoV-2 target nucleic acids are DETECTED  SARS-CoV-2 RNA is generally detectable in upper respiratory specimens  during the acute phase of infection.  Positive results are indicative  of the presence of the identified virus, but do not rule out bacterial infection or co-infection with other pathogens not detected by the test.  Clinical correlation with patient history and  other diagnostic information is necessary to determine patient infection status.  The expected result is negative.  Fact Sheet for Patients:   RoadLapTop.co.za   Fact Sheet for Healthcare Providers:   http://kim-miller.com/    This test is not yet approved or cleared by the [place] FDA and  has been authorized for detection and/or diagnosis of SARS-CoV-2 by FDA under an Emergency Use Authorization (EUA).  This EUA will remain in effect (meaning this test can be used) for the duration of  the COVID-19 declaration under Section 564(b)(1)  of the Act, 21 U.S.C. section  360-bbb-3(b)(1), unless the authorization is terminated or revoked sooner.   Performed at [facility], [address] [place], [place] [address]       Radiology Studies last 3 days: DG HIP UNILAT WITH PELVIS 2-3 VIEWS LEFT  Result Date: 05/07/2023 CLINICAL DATA:  Surgical internal fixation of proximal left femoral fracture. EXAM: DG HIP (WITH OR WITHOUT PELVIS) 2-3V LEFT; DG C-ARM 1-60 MIN-NO REPORT  Fluoroscopy time: 1 minute 24 seconds. COMPARISON:  May 06, 2023. FINDINGS: Six intraoperative fluoroscopic images were obtained the left hip during surgical internal fixation of proximal left femoral fracture. IMPRESSION: Fluoroscopic guidance provided during surgical internal fixation of proximal left femoral fracture. Electronically Signed   By: Lupita Raider M.D.   On: 05/07/2023 15:02   DG C-Arm 1-60 Min-No Report  Result Date: 05/07/2023 Fluoroscopy was utilized by the requesting physician.  No radiographic interpretation.   CT Hip Left Wo Contrast  Result Date: 05/06/2023 CLINICAL DATA:  Hip fracture EXAM: CT OF THE LEFT HIP WITHOUT CONTRAST TECHNIQUE: Multidetector CT imaging of the left hip was performed according to the standard protocol. Multiplanar CT image reconstructions were also generated. RADIATION DOSE REDUCTION: This exam was performed according to the departmental dose-optimization program which includes automated exposure control, adjustment of the mA and/or kV according to patient size and/or use of iterative reconstruction technique. COMPARISON:  Left hip x-ray same day FINDINGS: Bones/Joint/Cartilage The bones are diffusely osteopenic. There is an acute mildly impacted comminuted intratrochanteric fracture of the left femur. There is mild apex anterior angulation. There is no dislocation. No focal osseous lesion identified. Ligaments Suboptimally assessed by CT. Muscles and Tendons There is intramuscular edema surrounding the fracture site. No definitive  hematoma. Soft tissues Colonic diverticulosis present. No focal hematoma or fluid collection. IMPRESSION: Acute mildly impacted comminuted intratrochanteric fracture of the left femur. Electronically Signed   By: Darliss Cheney M.D.   On: 05/06/2023 23:04   DG Chest Portable 1 View  Result Date: 05/06/2023 CLINICAL DATA:  Ground level fall and left hip pain.  Preop. EXAM: PORTABLE CHEST 1 VIEW COMPARISON:  07/26/2022 FINDINGS: Stable cardiomediastinal silhouette. Aortic atherosclerotic calcification. Bibasilar atelectasis/scarring. No focal consolidation, pleural effusion, or pneumothorax. No displaced rib fractures. Moderate hiatal hernia. IMPRESSION: No acute cardiopulmonary disease. Moderate hiatal hernia. Electronically Signed   By: Minerva Fester M.D.   On: 05/06/2023 22:33   DG Hip Unilat W or Wo Pelvis 2-3 Views Left  Result Date: 05/06/2023 CLINICAL DATA:  Fall, left hip pain EXAM: DG HIP (WITH OR WITHOUT PELVIS) 2-3V LEFT COMPARISON:  None Available. FINDINGS: The osseous structures are diffusely markedly osteopenic. There is an acute, impacted basicervical/intratrochanteric fracture of the left hip with probable cortical avulsion of the lesser trochanter. Femoral head is still seated within the left acetabulum. Mild bilateral degenerative hip arthritis. Degenerative changes are seen within the sacroiliac joints. No other fracture identified. L4 vertebroplasty has been performed. IMPRESSION: 1. Acute, impacted basicervical/intratrochanteric fracture of the left hip. Electronically Signed   By: Helyn Numbers M.D.   On: 05/06/2023 21:34             LOS: 3 days      Sunnie Nielsen, DO [facility] 05/09/2023, 2:24 PM    Dictation software may have been used to generate the above note. Typos may occur and escape review in typed/dictated notes. Please contact Dr Lyn Hollingshead directly for clarity if needed.  Staff may message me via secure chat in Epic  but this may not  receive an immediate response,  please page me for urgent matters!  If 7PM-7AM, please contact night coverage [email or web address]

## 2023-05-09 NOTE — Progress Notes (Signed)
Physical Therapy Treatment Patient Details Name: Rachael Humphrey MRN: [id number] DOB: 02-16-1930 Today's Date: 05/09/2023   History of Present Illness 89 y/o female presented to ED on 05/06/23 after fall while outside watering flowers. Sustained acute mildly impacted comminuted intertrochanteric fracture of L femur. S/p L femur IM nail on 8/22. Tested (+) for COVID. PMH: CAD s/p PCI, hx of colon cancer, HTN, prior falls, hx of kyphoplasty, T2DM    PT Comments  Pt is making gradual progress with mobility ambulating with RW 25' with CGA to MinA (at the end of walk pt had increased antalgic gait requiring seated rest, but no LOB).  Pt continues to require assist with LEs for bed mobility due to pain, fatigue and weakness. Continued PT will assist pt towards greater safety and independence with mobility.   If plan is discharge home, recommend the following: A little help with walking and/or transfers;A little help with bathing/dressing/bathroom;Assistance with cooking/housework;Direct supervision/assist for medications management;Direct supervision/assist for financial management;Assist for transportation;Help with stairs or ramp for entrance;Supervision due to cognitive status   Can travel by private vehicle     Yes  Equipment Recommendations  Rolling walker (2 wheels);BSC/3in1    Recommendations for Other Services       Precautions / Restrictions Precautions Precautions: Fall Restrictions Weight Bearing Restrictions: Yes LLE Weight Bearing: Weight bearing as tolerated     Mobility  Bed Mobility Overal bed mobility: Needs Assistance Bed Mobility: Supine to Sit, Sit to Supine     Supine to sit: Min assist Sit to supine: Mod assist   General bed mobility comments: Mod A to manage pt's LEs with sit to supine.    Transfers Overall transfer level: Needs assistance Equipment used: Rolling walker (2 wheels) Transfers: Sit to/from Stand Sit to Stand: Contact guard assist            General transfer comment: cues for hand placement    Ambulation/Gait Ambulation/Gait assistance: Min assist, Contact guard assist Gait Distance (Feet): 35 Feet Assistive device: Rolling walker (2 wheels) Gait Pattern/deviations: Step-to pattern, Decreased stride length, Decreased stance time - left, Decreased weight shift to left, Antalgic Gait velocity: decreased     General Gait Details: At the end of walk, pt had an increase of antalgic gait which required min A for pt to maintain balance.   Stairs             Wheelchair Mobility     Tilt Bed    Modified Rankin (Stroke Patients Only)       Balance Overall balance assessment: Needs assistance, History of Falls Sitting-balance support: Feet supported, No upper extremity supported Sitting balance-Leahy Scale: Fair     Standing balance support: During functional activity, Bilateral upper extremity supported Standing balance-Leahy Scale: Fair                              Cognition Arousal: Alert Behavior During Therapy: WFL for tasks assessed/performed Overall Cognitive Status: No family/caregiver present to determine baseline cognitive functioning                                          Exercises      General Comments        Pertinent Vitals/Pain Pain Assessment Faces Pain Scale: Hurts whole lot Pain Location: L hip Pain Descriptors / Indicators: Grimacing Pain Intervention(s):  Limited activity within patient's tolerance, Repositioned    Home Living                          Prior Function            PT Goals (current goals can now be found in the care plan section) Acute Rehab PT Goals Patient Stated Goal: did not state PT Goal Formulation: Patient unable to participate in goal setting Time For Goal Achievement: 05/22/23 Potential to Achieve Goals: Good Progress towards PT goals: Progressing toward goals    Frequency    Min 1X/week      PT  Plan      Co-evaluation              AM-PAC PT "6 Clicks" Mobility   Outcome Measure  Help needed turning from your back to your side while in a flat bed without using bedrails?: A Little   Help needed moving to and from a bed to a chair (including a wheelchair)?: A Little Help needed standing up from a chair using your arms (e.g., wheelchair or bedside chair)?: A Little Help needed to walk in hospital room?: A Little Help needed climbing 3-5 steps with a railing? : A Lot 6 Click Score: 14    End of Session Equipment Utilized During Treatment: Gait belt Activity Tolerance: Patient tolerated treatment well Patient left: in bed;with bed alarm set;with call bell/phone within reach;with family/visitor present Nurse Communication: Mobility status PT Visit Diagnosis: Unsteadiness on feet (R26.81);Muscle weakness (generalized) (M62.81);History of falling (Z91.81);Other abnormalities of gait and mobility (R26.89);Difficulty in walking, not elsewhere classified (R26.2)     Time: 1220-1240 PT Time Calculation (min) (ACUTE ONLY): 20 min  Charges:    $Gait Training: 8-22 mins PT General Charges $$ ACUTE PT VISIT: 1 Visit                     Hortencia Conradi, PTA  05/09/23, 2:32 PM

## 2023-05-09 NOTE — Progress Notes (Signed)
Provided  8 ounces of prune juice to help with BM.

## 2023-05-09 NOTE — Plan of Care (Signed)
  Problem: Clinical Measurements: Goal: Ability to maintain clinical measurements within normal limits will improve Outcome: Progressing Goal: Will remain free from infection Outcome: Progressing Goal: Diagnostic test results will improve Outcome: Progressing Goal: Respiratory complications will improve Outcome: Progressing Goal: Cardiovascular complication will be avoided Outcome: Progressing   Problem: Pain Managment: Goal: General experience of comfort will improve Outcome: Progressing   Problem: Safety: Goal: Ability to remain free from injury will improve Outcome: Progressing   Problem: Skin Integrity: Goal: Risk for impaired skin integrity will decrease Outcome: Progressing   Problem: Coping: Goal: Psychosocial and spiritual needs will be supported Outcome: Progressing   Problem: Metabolic: Goal: Ability to maintain appropriate glucose levels will improve Outcome: Progressing

## 2023-05-09 NOTE — Plan of Care (Signed)
  Problem: Coping: Goal: Psychosocial and spiritual needs will be supported Outcome: Progressing   Problem: Respiratory: Goal: Will maintain a patent airway Outcome: Progressing   Problem: Tissue Perfusion: Goal: Adequacy of tissue perfusion will improve Outcome: Progressing   Problem: Skin Integrity: Goal: Risk for impaired skin integrity will decrease Outcome: Progressing   Problem: Activity: Goal: Risk for activity intolerance will decrease Outcome: Progressing   Problem: Pain Managment: Goal: General experience of comfort will improve Outcome: Progressing   Problem: Safety: Goal: Ability to remain free from injury will improve Outcome: Progressing   Problem: Skin Integrity: Goal: Risk for impaired skin integrity will decrease Outcome: Progressing

## 2023-05-09 NOTE — Progress Notes (Signed)
Subjective: 2 Days Post-Op Procedure(s) (LRB): INTRAMEDULLARY (IM) NAIL INTERTROCHANTERIC (Left) Patient reports pain as moderate.  More confused this morning. Plan is to go Rehab after hospital stay. Negative for chest pain and shortness of breath Fever: no Gastrointestinal: Negative for nausea and vomiting  Objective: Vital signs in last 24 hours: Temp:  [97.8 F (36.6 C)-98.4 F (36.9 C)] 98.2 F (36.8 C) (08/24 0416) Pulse Rate:  [80-100] 100 (08/24 0416) Resp:  [16-18] 18 (08/24 0416) BP: (98-117)/(54-80) 116/80 (08/24 0416) SpO2:  [96 %-99 %] 97 % (08/24 0416)  Intake/Output from previous day:  Intake/Output Summary (Last 24 hours) at 05/09/2023 0659 Last data filed at 05/09/2023 0420 Gross per 24 hour  Intake 240 ml  Output 500 ml  Net -260 ml    Intake/Output this shift: Total I/O In: -  Out: 200 [Urine:200]  Labs: Recent Labs    05/06/23 2027 05/08/23 0548 05/09/23 0536  HGB 13.8 11.2* 10.7*   Recent Labs    05/08/23 0548 05/09/23 0536  WBC 9.7 8.9  RBC 3.56* 3.28*  HCT 32.4* 29.6*  PLT 128* 120*   Recent Labs    05/08/23 0548 05/09/23 0536  NA 136 135  K 3.6 4.0  CL 99 104  CO2 24 24  BUN 16 14  CREATININE 0.65 0.61  GLUCOSE 124* 165*  CALCIUM 8.6* 8.9   No results for input(s): "LABPT", "INR" in the last 72 hours.   EXAM General - Patient is confused with place and time Extremity - Neurovascular intact Sensation intact distally Dorsiflexion/Plantar flexion intact Compartment soft Dressing/Incision - clean, dry, no drainage Motor Function - intact, moving foot and toes well on exam.  Ambulated 12 feet with physical therapy.  Past Medical History:  Diagnosis Date   Adenocarcinoma, colon (HCC) 2005   Biatrial enlargement    Chronic anticoagulation    Clopidogrel   Complication of anesthesia    Coronary artery disease    a.) 2.75 x 28 mm Vision BMS to pLAD on 02/23/2014   GERD (gastroesophageal reflux disease)    Grade II  diastolic dysfunction    H/O hiatal hernia    Hemorrhoids    internal-not a problem now.   History of kidney stones    multiple   History of methicillin resistant staphylococcus aureus (MRSA) 2020   History of shingles 04/18/2014   HLD (hyperlipidemia)    Hypertension    Hypothyroidism    Myocardial infarction (HCC)    PONV (postoperative nausea and vomiting)    Senile osteoporosis    T2DM (type 2 diabetes mellitus) (HCC)    TIA (transient ischemic attack)    Valvular regurgitation    panvalvular    Assessment/Plan: 2 Days Post-Op Procedure(s) (LRB): INTRAMEDULLARY (IM) NAIL INTERTROCHANTERIC (Left) Principal Problem:   Closed comminuted intertrochanteric fracture of proximal end of left femur, initial encounter (HCC) Active Problems:   Acquired hypothyroidism   Coronary artery disease   Benign essential hypertension   History of TIA (transient ischemic attack)   Type 2 diabetes mellitus without complication (HCC)   Accidental fall   COVID-19 virus infection  Estimated body mass index is 23.29 kg/m as calculated from the following:   Height as of this encounter: 5\' 1"  (1.549 m).   Weight as of this encounter: 55.9 kg. Advance diet Up with therapy  Discharge planning: Patient will follow-up in 2 weeks at [facility] clinic orthopedics for staple removal and x-rays of the left hip.  No showering.  DVT Prophylaxis - Aspirin, TED  hose, and Plavix Weight-Bearing as tolerated to left leg  Dedra Skeens, PA-C Orthopaedic Surgery 05/09/2023, 6:59 AM

## 2023-05-10 DIAGNOSIS — S72142A Displaced intertrochanteric fracture of left femur, initial encounter for closed fracture: Secondary | ICD-10-CM | POA: Diagnosis not present

## 2023-05-10 LAB — BASIC METABOLIC PANEL
Anion gap: 7 (ref 5–15)
BUN: 14 mg/dL (ref 8–23)
CO2: 24 mmol/L (ref 22–32)
Calcium: 8.8 mg/dL — ABNORMAL LOW (ref 8.9–10.3)
Chloride: 104 mmol/L (ref 98–111)
Creatinine, Ser: 0.65 mg/dL (ref 0.44–1.00)
GFR, Estimated: >60 mL/min (ref >60)
Glucose, Bld: 147 mg/dL — ABNORMAL HIGH (ref 70–99)
Potassium: 3.9 mmol/L (ref 3.5–5.1)
Sodium: 135 mmol/L (ref 135–145)

## 2023-05-10 LAB — GLUCOSE, CAPILLARY
Glucose-Capillary: 123 mg/dL — ABNORMAL HIGH (ref 70–99)
Glucose-Capillary: 146 mg/dL — ABNORMAL HIGH (ref 70–99)
Glucose-Capillary: 148 mg/dL — ABNORMAL HIGH (ref 70–99)
Glucose-Capillary: 156 mg/dL — ABNORMAL HIGH (ref 70–99)
Glucose-Capillary: 162 mg/dL — ABNORMAL HIGH (ref 70–99)
Glucose-Capillary: 95 mg/dL (ref 70–99)
Glucose-Capillary: 99 mg/dL (ref 70–99)

## 2023-05-10 LAB — CBC
HCT: 29.9 % — ABNORMAL LOW (ref 36.0–46.0)
Hemoglobin: 10.8 g/dL — ABNORMAL LOW (ref 12.0–15.0)
MCH: 32.5 pg (ref 26.0–34.0)
MCHC: 36.1 g/dL — ABNORMAL HIGH (ref 30.0–36.0)
MCV: 90.1 fL (ref 80.0–100.0)
Platelets: 145 10*3/uL — ABNORMAL LOW (ref 150–400)
RBC: 3.32 MIL/uL — ABNORMAL LOW (ref 3.87–5.11)
RDW: 12.5 % (ref 11.5–15.5)
WBC: 8.3 10*3/uL (ref 4.0–10.5)
nRBC: 0 % (ref 0.0–0.2)

## 2023-05-10 NOTE — Progress Notes (Signed)
Subjective: 3 Days Post-Op Procedure(s) (LRB): INTRAMEDULLARY (IM) NAIL INTERTROCHANTERIC (Left) Patient reports pain as mild to moderate.  More resting better this morning.  Pain is improving. Plan is to go Rehab after hospital stay. Negative for chest pain and shortness of breath Fever: no Gastrointestinal: Negative for nausea and vomiting  Objective: Vital signs in last 24 hours: Temp:  [98 F (36.7 C)-98.9 F (37.2 C)] 98.9 F (37.2 C) (08/25 0025) Pulse Rate:  [90-104] 90 (08/25 0025) Resp:  [16-18] 16 (08/25 0025) BP: (102-108)/(66-82) 108/82 (08/25 0025) SpO2:  [96 %-99 %] 96 % (08/25 0025)  Intake/Output from previous day:  Intake/Output Summary (Last 24 hours) at 05/10/2023 0659 Last data filed at 05/09/2023 2130 Gross per 24 hour  Intake 300 ml  Output --  Net 300 ml    Intake/Output this shift: Total I/O In: 300 [P.O.:300] Out: -   Labs: Recent Labs    05/08/23 0548 05/09/23 0536 05/10/23 0530  HGB 11.2* 10.7* 10.8*   Recent Labs    05/09/23 0536 05/10/23 0530  WBC 8.9 8.3  RBC 3.28* 3.32*  HCT 29.6* 29.9*  PLT 120* 145*   Recent Labs    05/09/23 0536 05/10/23 0530  NA 135 135  K 4.0 3.9  CL 104 104  CO2 24 24  BUN 14 14  CREATININE 0.61 0.65  GLUCOSE 165* 147*  CALCIUM 8.9 8.8*   No results for input(s): "LABPT", "INR" in the last 72 hours.   EXAM General - Patient is confused with place and time Extremity - Neurovascular intact Sensation intact distally Dorsiflexion/Plantar flexion intact Compartment soft Dressing/Incision - clean, dry, no drainage Motor Function - intact, moving foot and toes well on exam.  Ambulated 35 feet with physical therapy.  Past Medical History:  Diagnosis Date   Adenocarcinoma, colon (HCC) 2005   Biatrial enlargement    Chronic anticoagulation    Clopidogrel   Complication of anesthesia    Coronary artery disease    a.) 2.75 x 28 mm Vision BMS to pLAD on 02/23/2014   GERD (gastroesophageal  reflux disease)    Grade II diastolic dysfunction    H/O hiatal hernia    Hemorrhoids    internal-not a problem now.   History of kidney stones    multiple   History of methicillin resistant staphylococcus aureus (MRSA) 2020   History of shingles 04/18/2014   HLD (hyperlipidemia)    Hypertension    Hypothyroidism    Myocardial infarction (HCC)    PONV (postoperative nausea and vomiting)    Senile osteoporosis    T2DM (type 2 diabetes mellitus) (HCC)    TIA (transient ischemic attack)    Valvular regurgitation    panvalvular    Assessment/Plan: 3 Days Post-Op Procedure(s) (LRB): INTRAMEDULLARY (IM) NAIL INTERTROCHANTERIC (Left) Principal Problem:   Closed comminuted intertrochanteric fracture of proximal end of left femur, initial encounter (HCC) Active Problems:   Acquired hypothyroidism   Coronary artery disease   Benign essential hypertension   History of TIA (transient ischemic attack)   Type 2 diabetes mellitus without complication (HCC)   Accidental fall   COVID-19 virus infection  Estimated body mass index is 23.29 kg/m as calculated from the following:   Height as of this encounter: 5\' 1"  (1.549 m).   Weight as of this encounter: 55.9 kg. Advance diet Up with therapy  Discharge planning: Patient will follow-up in 2 weeks at [facility] clinic orthopedics for staple removal and x-rays of the left hip.  No showering.  DVT Prophylaxis - Aspirin, TED hose, and Plavix Weight-Bearing as tolerated to left leg  Dedra Skeens, PA-C Orthopaedic Surgery 05/10/2023, 6:59 AM

## 2023-05-10 NOTE — Plan of Care (Signed)
  Problem: Skin Integrity: Goal: Risk for impaired skin integrity will decrease Outcome: Progressing   Problem: Pain Managment: Goal: General experience of comfort will improve Outcome: Progressing   Problem: Safety: Goal: Ability to remain free from injury will improve Outcome: Progressing   Problem: Coping: Goal: Level of anxiety will decrease Outcome: Progressing   Problem: Elimination: Goal: Will not experience complications related to bowel motility Outcome: Progressing

## 2023-05-10 NOTE — Progress Notes (Signed)
PROGRESS NOTE    Rachael Humphrey   HQI:[id number] DOB: [birth year]  DOA: 05/06/2023 Date of Service: 05/10/23 PCP: Marguarite Arbour, MD     Brief Narrative / Hospital Course:   Rachael Humphrey is a 89 y.o. female with medical history significant for CAD s/p PCI, history of colon cancer, HTN, hypothyroidism, kyphoplasty and prior falls, who presents to the ED following an accidental fall while gardening.  Patient was recently diagnosed with COVID after having some mild respiratory symptoms and was improving and felt well enough to go out gardening.  She fell as she tried to turn around.  08/21: to ED, (+)acute mildly impacted comminuted intertrochanteric fracture of the left femur. Admitted to hospitalist service. Ortho planning surgery  08/22: to OR for intramedullary nail L fenur fx.  08/23 (Fri): PT/OT recs for SNF rehab, TOC to work on this, expect pt will be here thru the weekend  08/24-08/25 (Sat/Sun): stable, no concerns   Consultants:  Orthopedic surgery   Procedures: 05/07/23 intramedullary nail L femur fracture       ASSESSMENT & PLAN:   Principal Problem:   Closed comminuted intertrochanteric fracture of proximal end of left femur, initial encounter (HCC) Active Problems:   Accidental fall   COVID-19 virus infection   Acquired hypothyroidism   Coronary artery disease   Benign essential hypertension   History of TIA (transient ischemic attack)   Type 2 diabetes mellitus without complication (HCC)   Closed comminuted intertrochanteric fracture of proximal end of left femur, initial encounter (HCC) Accidental fall Preoperative clearance - At low/optimized risk of perioperative cardiopulmonary events Patient has a history of CAD and has been symptom-free.  Last saw her cardiologist in February 2024 and was given a 4-month follow-up.  Also has a history of prior TIA maintained on Plavix and atorvastatin S/p intramedullary nail placement  Pain control SNF rehab   Ortho follow-up in 2 weeks at [facility] clinic orthopedics for staple removal and x-rays of the left hip.   No showering, per ortho  DVT Prophylaxis - Aspirin, TED hose, and Plavix Weight-Bearing as tolerated to left leg  Recent diagnosis of COVID  just over a week ago but symptomatic only for weakness that has been improving Respiratory precautions   Acquired hypothyroidism Continue levothyroxine  Coronary artery disease Patient with no anginal pain.  EKG nonacute Continue atorvastatin and metoprolol Reduced metoprolol d/t borderline low BP this morning  held Plavix for surgery, restarted today   Benign essential hypertension Blood pressure controlled Reduced metoprolol d/t borderline low BP this morning   History of TIA (transient ischemic attack) Continue atorvastatin, plavix   Type 2 diabetes mellitus without complication (HCC) Sliding scale insulin coverage    DVT prophylaxis: lovenox  Pertinent IV fluids/nutrition: IV fluids not needed once taking po  Central lines / invasive devices: none  Code Status: DNR ACP documentation reviewed: none on file   Current Admission Status: inpatient  TOC needs / Dispo plan: SNF rehab Barriers to discharge / significant pending items: will need SNF and expect she will be here thru the weekend pending placement              Subjective / Brief ROS:  Patient reports feeling a bit better today No concerns  Denies CP/SOB.   Family Communication: friend is at bedside on rounds     Objective Findings:  Vitals:   05/09/23 0850 05/09/23 1634 05/10/23 0025 05/10/23 1017  BP: 102/80 105/66 108/82 112/66  Pulse: 95 (!) 104  90 100  Resp: 16 18 16 16   Temp: 98.7 F (37.1 C) 98 F (36.7 C) 98.9 F (37.2 C) 98.1 F (36.7 C)  TempSrc: Oral   Oral  SpO2: 99% 98% 96% 100%  Weight:      Height:        Intake/Output Summary (Last 24 hours) at 05/10/2023 1339 Last data filed at 05/09/2023 2130 Gross per 24 hour   Intake 300 ml  Output --  Net 300 ml   Filed Weights   05/06/23 1939  Weight: 55.9 kg    Examination:  Physical Exam Constitutional:      General: She is not in acute distress. Cardiovascular:     Rate and Rhythm: Normal rate and regular rhythm.  Pulmonary:     Effort: Pulmonary effort is normal.     Breath sounds: Normal breath sounds.  Abdominal:     Palpations: Abdomen is soft.  Musculoskeletal:     Right lower leg: No edema.     Left lower leg: No edema.  Neurological:     Mental Status: She is alert. Mental status is at baseline.  Psychiatric:        Mood and Affect: Mood normal.        Behavior: Behavior normal.          Scheduled Medications:   acetaminophen  1,000 mg Oral Q8H   aspirin EC  325 mg Oral Daily   atorvastatin  20 mg Oral Daily   calcium-vitamin D  1 tablet Oral Q breakfast   clopidogrel  75 mg Oral Daily   docusate sodium  100 mg Oral BID   enoxaparin (LOVENOX) injection  30 mg Subcutaneous Q24H   feeding supplement  237 mL Oral BID BM   insulin aspart  0-9 Units Subcutaneous Q4H   levothyroxine  25 mcg Oral Q0600   loratadine  10 mg Oral Daily   metoprolol succinate  50 mg Oral q morning   montelukast  10 mg Oral QHS   multivitamin with minerals  1 tablet Oral Daily   NIFEdipine  30 mg Oral Daily   pantoprazole  40 mg Oral BID    Continuous Infusions:  sodium chloride 75 mL/hr at 05/08/23 0303   methocarbamol (ROBAXIN) IV      PRN Medications:  albuterol, bisacodyl, fluticasone, guaiFENesin-dextromethorphan, HYDROmorphone (DILAUDID) injection, methocarbamol **OR** methocarbamol (ROBAXIN) IV, metoCLOPramide **OR** metoCLOPramide (REGLAN) injection, ondansetron **OR** ondansetron (ZOFRAN) IV, oxyCODONE, oxyCODONE, senna-docusate, sodium phosphate  Antimicrobials from admission:  Anti-infectives (From admission, onward)    Start     Dose/Rate Route Frequency Ordered Stop   05/07/23 2000  ceFAZolin (ANCEF) IVPB 2g/100 mL premix         2 g 200 mL/hr over 30 Minutes Intravenous Every 6 hours 05/07/23 1535 05/08/23 1533   05/07/23 1130  ceFAZolin (ANCEF) IVPB 2g/100 mL premix        2 g 200 mL/hr over 30 Minutes Intravenous On call to O.R. 05/06/23 2207 05/08/23 1533           Data Reviewed:  I have personally reviewed the following...  CBC: Recent Labs  Lab 05/06/23 2027 05/08/23 0548 05/09/23 0536 05/10/23 0530  WBC 6.9 9.7 8.9 8.3  NEUTROABS 4.5  --   --   --   HGB 13.8 11.2* 10.7* 10.8*  HCT 39.7 32.4* 29.6* 29.9*  MCV 93.4 91.0 90.2 90.1  PLT 140* 128* 120* 145*   Basic Metabolic Panel: Recent Labs  Lab 05/06/23 2027 05/08/23  1610 05/09/23 0536 05/10/23 0530  NA 135 136 135 135  K 4.0 3.6 4.0 3.9  CL 106 99 104 104  CO2 21* 24 24 24   GLUCOSE 160* 124* 165* 147*  BUN 17 16 14 14   CREATININE 0.62 0.65 0.61 0.65  CALCIUM 8.3* 8.6* 8.9 8.8*   GFR: Estimated Creatinine Clearance: 34.6 mL/min (by C-G formula based on SCr of 0.65 mg/dL). Liver Function Tests: No results for input(s): "AST", "ALT", "ALKPHOS", "BILITOT", "PROT", "ALBUMIN" in the last 168 hours. No results for input(s): "LIPASE", "AMYLASE" in the last 168 hours. No results for input(s): "AMMONIA" in the last 168 hours. Coagulation Profile: No results for input(s): "INR", "PROTIME" in the last 168 hours. Cardiac Enzymes: No results for input(s): "CKTOTAL", "CKMB", "CKMBINDEX", "TROPONINI" in the last 168 hours. BNP (last 3 results) No results for input(s): "PROBNP" in the last 8760 hours. HbA1C: No results for input(s): "HGBA1C" in the last 72 hours.  CBG: Recent Labs  Lab 05/09/23 2012 05/10/23 0022 05/10/23 0416 05/10/23 0832 05/10/23 1248  GLUCAP 191* 99 146* 123* 148*   Lipid Profile: No results for input(s): "CHOL", "HDL", "LDLCALC", "TRIG", "CHOLHDL", "LDLDIRECT" in the last 72 hours. Thyroid Function Tests: No results for input(s): "TSH", "T4TOTAL", "FREET4", "T3FREE", "THYROIDAB" in the last 72  hours. Anemia Panel: No results for input(s): "VITAMINB12", "FOLATE", "FERRITIN", "TIBC", "IRON", "RETICCTPCT" in the last 72 hours. Most Recent Urinalysis On File:     Component Value Date/Time   COLORURINE YELLOW (A) 05/06/2023 2027   APPEARANCEUR CLEAR (A) 05/06/2023 2027   APPEARANCEUR Clear 04/01/2014 0246   LABSPEC 1.009 05/06/2023 2027   LABSPEC 1.006 04/01/2014 0246   PHURINE 6.0 05/06/2023 2027   GLUCOSEU 50 (A) 05/06/2023 2027   GLUCOSEU >=500 04/01/2014 0246   HGBUR NEGATIVE 05/06/2023 2027   BILIRUBINUR NEGATIVE 05/06/2023 2027   BILIRUBINUR Negative 04/01/2014 0246   KETONESUR NEGATIVE 05/06/2023 2027   PROTEINUR NEGATIVE 05/06/2023 2027   NITRITE NEGATIVE 05/06/2023 2027   LEUKOCYTESUR MODERATE (A) 05/06/2023 2027   LEUKOCYTESUR Negative 04/01/2014 0246   Sepsis Labs: @LABRCNTIP (procalcitonin:4,lacticidven:4) Microbiology: Recent Results (from the past 240 hour(s))  SARS Coronavirus 2 by RT PCR (hospital order, performed in [facility] hospital lab) *cepheid single result test* Urine, Clean Catch     Status: Abnormal   Collection Time: 05/06/23  8:27 PM   Specimen: Urine, Clean Catch; Nasal Swab  Result Value Ref Range Status   SARS Coronavirus 2 by RT PCR POSITIVE (A) NEGATIVE Final    Comment: (NOTE) SARS-CoV-2 target nucleic acids are DETECTED  SARS-CoV-2 RNA is generally detectable in upper respiratory specimens  during the acute phase of infection.  Positive results are indicative  of the presence of the identified virus, but do not rule out bacterial infection or co-infection with other pathogens not detected by the test.  Clinical correlation with patient history and  other diagnostic information is necessary to determine patient infection status.  The expected result is negative.  Fact Sheet for Patients:   RoadLapTop.co.za   Fact Sheet for Healthcare Providers:   http://kim-miller.com/    This test is  not yet approved or cleared by the [place] FDA and  has been authorized for detection and/or diagnosis of SARS-CoV-2 by FDA under an Emergency Use Authorization (EUA).  This EUA will remain in effect (meaning this test can be used) for the duration of  the COVID-19 declaration under Section 564(b)(1)  of the Act, 21 U.S.C. section 360-bbb-3(b)(1), unless the authorization is terminated or  revoked sooner.   Performed at [facility], [address] [place], [place] [address]       Radiology Studies last 3 days: DG HIP UNILAT WITH PELVIS 2-3 VIEWS LEFT  Result Date: 05/07/2023 CLINICAL DATA:  Surgical internal fixation of proximal left femoral fracture. EXAM: DG HIP (WITH OR WITHOUT PELVIS) 2-3V LEFT; DG C-ARM 1-60 MIN-NO REPORT Fluoroscopy time: 1 minute 24 seconds. COMPARISON:  May 06, 2023. FINDINGS: Six intraoperative fluoroscopic images were obtained the left hip during surgical internal fixation of proximal left femoral fracture. IMPRESSION: Fluoroscopic guidance provided during surgical internal fixation of proximal left femoral fracture. Electronically Signed   By: Lupita Raider M.D.   On: 05/07/2023 15:02   DG C-Arm 1-60 Min-No Report  Result Date: 05/07/2023 Fluoroscopy was utilized by the requesting physician.  No radiographic interpretation.   CT Hip Left Wo Contrast  Result Date: 05/06/2023 CLINICAL DATA:  Hip fracture EXAM: CT OF THE LEFT HIP WITHOUT CONTRAST TECHNIQUE: Multidetector CT imaging of the left hip was performed according to the standard protocol. Multiplanar CT image reconstructions were also generated. RADIATION DOSE REDUCTION: This exam was performed according to the departmental dose-optimization program which includes automated exposure control, adjustment of the mA and/or kV according to patient size and/or use of iterative reconstruction technique. COMPARISON:  Left hip x-ray same day FINDINGS: Bones/Joint/Cartilage The bones are  diffusely osteopenic. There is an acute mildly impacted comminuted intratrochanteric fracture of the left femur. There is mild apex anterior angulation. There is no dislocation. No focal osseous lesion identified. Ligaments Suboptimally assessed by CT. Muscles and Tendons There is intramuscular edema surrounding the fracture site. No definitive hematoma. Soft tissues Colonic diverticulosis present. No focal hematoma or fluid collection. IMPRESSION: Acute mildly impacted comminuted intratrochanteric fracture of the left femur. Electronically Signed   By: Darliss Cheney M.D.   On: 05/06/2023 23:04   DG Chest Portable 1 View  Result Date: 05/06/2023 CLINICAL DATA:  Ground level fall and left hip pain.  Preop. EXAM: PORTABLE CHEST 1 VIEW COMPARISON:  07/26/2022 FINDINGS: Stable cardiomediastinal silhouette. Aortic atherosclerotic calcification. Bibasilar atelectasis/scarring. No focal consolidation, pleural effusion, or pneumothorax. No displaced rib fractures. Moderate hiatal hernia. IMPRESSION: No acute cardiopulmonary disease. Moderate hiatal hernia. Electronically Signed   By: Minerva Fester M.D.   On: 05/06/2023 22:33   DG Hip Unilat W or Wo Pelvis 2-3 Views Left  Result Date: 05/06/2023 CLINICAL DATA:  Fall, left hip pain EXAM: DG HIP (WITH OR WITHOUT PELVIS) 2-3V LEFT COMPARISON:  None Available. FINDINGS: The osseous structures are diffusely markedly osteopenic. There is an acute, impacted basicervical/intratrochanteric fracture of the left hip with probable cortical avulsion of the lesser trochanter. Femoral head is still seated within the left acetabulum. Mild bilateral degenerative hip arthritis. Degenerative changes are seen within the sacroiliac joints. No other fracture identified. L4 vertebroplasty has been performed. IMPRESSION: 1. Acute, impacted basicervical/intratrochanteric fracture of the left hip. Electronically Signed   By: Helyn Numbers M.D.   On: 05/06/2023 21:34              LOS: 4 days      Sunnie Nielsen, DO [facility] 05/10/2023, 1:39 PM    Dictation software may have been used to generate the above note. Typos may occur and escape review in typed/dictated notes. Please contact Dr Lyn Hollingshead directly for clarity if needed.  Staff may message me via secure chat in Epic  but this may not receive an immediate response,  please page me  for urgent matters!  If 7PM-7AM, please contact night coverage [email or web address]

## 2023-05-10 NOTE — Plan of Care (Signed)
  Problem: Education: Goal: Ability to describe self-care measures that may prevent or decrease complications (Diabetes Survival Skills Education) will improve Outcome: Progressing   Problem: Coping: Goal: Ability to adjust to condition or change in health will improve Outcome: Progressing   Problem: Fluid Volume: Goal: Ability to maintain a balanced intake and output will improve Outcome: Progressing   Problem: Skin Integrity: Goal: Risk for impaired skin integrity will decrease Outcome: Progressing   Problem: Education: Goal: Knowledge of risk factors and measures for prevention of condition will improve Outcome: Progressing   Problem: Health Behavior/Discharge Planning: Goal: Ability to manage health-related needs will improve Outcome: Progressing   Problem: Activity: Goal: Risk for activity intolerance will decrease Outcome: Progressing   Problem: Coping: Goal: Level of anxiety will decrease Outcome: Progressing   Problem: Elimination: Goal: Will not experience complications related to bowel motility Outcome: Progressing

## 2023-05-11 DIAGNOSIS — S72142A Displaced intertrochanteric fracture of left femur, initial encounter for closed fracture: Secondary | ICD-10-CM | POA: Diagnosis not present

## 2023-05-11 LAB — GLUCOSE, CAPILLARY
Glucose-Capillary: 130 mg/dL — ABNORMAL HIGH (ref 70–99)
Glucose-Capillary: 166 mg/dL — ABNORMAL HIGH (ref 70–99)
Glucose-Capillary: 174 mg/dL — ABNORMAL HIGH (ref 70–99)
Glucose-Capillary: 238 mg/dL — ABNORMAL HIGH (ref 70–99)
Glucose-Capillary: 116 mg/dL — ABNORMAL HIGH (ref 70–99)
Glucose-Capillary: 118 mg/dL — ABNORMAL HIGH (ref 70–99)

## 2023-05-11 NOTE — TOC Progression Note (Signed)
Transition of Care [facility]) - Progression Note    Patient Details  Name: Rachael Humphrey MRN: [id number] Date of Birth: 01/18/1930  [facility]) CM/SW Contact  Marlowe Sax, RN Phone Number: 05/11/2023, 3:03 PM  Clinical Narrative:    Spoke with the patient and her Caregiver / Sharion Settler, reviewed the bed offers, they chose [facility], I called Ricky at [facility] and acepted the bed offer, Ins pending c  Expected Discharge Plan: (P) Skilled Nursing Facility    Expected Discharge Plan and Services                                               Social Determinants of Health (SDOH) Interventions SDOH Screenings   Food Insecurity: No Food Insecurity (05/07/2023)  Housing: Low Risk  (05/07/2023)  Transportation Needs: No Transportation Needs (05/07/2023)  Utilities: Not At Risk (05/07/2023)  Tobacco Use: Low Risk  (05/07/2023)    Readmission Risk Interventions     No data to display

## 2023-05-11 NOTE — Care Management Important Message (Signed)
Important Message  Patient Details  Name: Rachael Humphrey MRN: [id number] Date of Birth: 11-28-1929   Medicare Important Message Given:  N/A - LOS <3 / Initial given by admissions     [name] 05/11/2023, 9:45 AM

## 2023-05-11 NOTE — Progress Notes (Signed)
PROGRESS NOTE    Rachael Humphrey   WUJ:[id number] DOB: 07/23/1930  DOA: 05/06/2023 Date of Service: 05/11/23 PCP: Marguarite Arbour, MD     Brief Narrative / Hospital Course:   Rachael Humphrey is a 89 y.o. female with medical history significant for CAD s/p PCI, history of colon cancer, HTN, hypothyroidism, kyphoplasty and prior falls, who presents to the ED following an accidental fall while gardening.  Patient was recently diagnosed with COVID after having some mild respiratory symptoms and was improving and felt well enough to go out gardening.  She fell as she tried to turn around.  08/21: to ED, (+)acute mildly impacted comminuted intertrochanteric fracture of the left femur. Admitted to hospitalist service. Ortho planning surgery  08/22: to OR for intramedullary nail L fenur fx.  08/23 (Fri): PT/OT recs for SNF rehab, TOC to work on this, expect pt will be here thru the weekend  08/24-08/26 (Sat/Sun/Mon): stable, no concerns   Consultants:  Orthopedic surgery   Procedures: 05/07/23 intramedullary nail L femur fracture       ASSESSMENT & PLAN:   Principal Problem:   Closed comminuted intertrochanteric fracture of proximal end of left femur, initial encounter (HCC) Active Problems:   Accidental fall   COVID-19 virus infection   Acquired hypothyroidism   Coronary artery disease   Benign essential hypertension   History of TIA (transient ischemic attack)   Type 2 diabetes mellitus without complication (HCC)   Closed comminuted intertrochanteric fracture of proximal end of left femur, initial encounter (HCC) Accidental fall Preoperative clearance - At low/optimized risk of perioperative cardiopulmonary events Patient has a history of CAD and has been symptom-free.  Last saw her cardiologist in February 2024 and was given a 30-month follow-up.  Also has a history of prior TIA maintained on Plavix and atorvastatin S/p intramedullary nail placement  Pain control SNF rehab   Ortho follow-up in 2 weeks at [facility] clinic orthopedics for staple removal and x-rays of the left hip.   No showering, per ortho  DVT Prophylaxis - Aspirin, TED hose, and Plavix Weight-Bearing as tolerated to left leg  Recent diagnosis of COVID  just over a week ago but symptomatic only for weakness that has been improving, she did have (+)test here on 08/21 Respiratory precautions per protocol  May affect SNF placement? TOC aware   Acquired hypothyroidism Continue levothyroxine  Coronary artery disease Patient with no anginal pain.  EKG nonacute Continue atorvastatin and metoprolol Reduced metoprolol d/t borderline low BP this morning  held Plavix for surgery, restarted today   Benign essential hypertension Blood pressure controlled Reduced metoprolol d/t borderline low BP this morning   History of TIA (transient ischemic attack) Continue atorvastatin, plavix   Type 2 diabetes mellitus without complication (HCC) Sliding scale insulin coverage    DVT prophylaxis: lovenox  Pertinent IV fluids/nutrition: IV fluids not needed once taking po  Central lines / invasive devices: none  Code Status: DNR ACP documentation reviewed: none on file   Current Admission Status: inpatient  TOC needs / Dispo plan: SNF rehab Barriers to discharge / significant pending items: will need SNF and expect she will be here thru the weekend pending placement              Subjective / Brief ROS:  Patient resting, family present, ask me not to wake her if possible, there are no concerns   Family Communication: family is at bedside on rounds     Objective Findings:  Vitals:  05/10/23 0025 05/10/23 1017 05/11/23 0107 05/11/23 0821  BP: 108/82 112/66 128/77 127/68  Pulse: 90 100 72 80  Resp: 16 16 16 17   Temp: 98.9 F (37.2 C) 98.1 F (36.7 C) 98 F (36.7 C) (!) 97.5 F (36.4 C)  TempSrc:  Oral    SpO2: 96% 100% 96% 100%  Weight:      Height:       No intake or  output data in the 24 hours ending 05/11/23 1326  Filed Weights   05/06/23 1939  Weight: 55.9 kg    Examination:  Physical Exam Constitutional:      General: She is not in acute distress. Pulmonary:     Effort: Pulmonary effort is normal.     Breath sounds: Normal breath sounds.  Musculoskeletal:     Right lower leg: No edema.     Left lower leg: No edema.          Scheduled Medications:   acetaminophen  1,000 mg Oral Q8H   aspirin EC  325 mg Oral Daily   atorvastatin  20 mg Oral Daily   calcium-vitamin D  1 tablet Oral Q breakfast   clopidogrel  75 mg Oral Daily   docusate sodium  100 mg Oral BID   enoxaparin (LOVENOX) injection  30 mg Subcutaneous Q24H   feeding supplement  237 mL Oral BID BM   insulin aspart  0-9 Units Subcutaneous Q4H   levothyroxine  25 mcg Oral Q0600   loratadine  10 mg Oral Daily   metoprolol succinate  50 mg Oral q morning   montelukast  10 mg Oral QHS   multivitamin with minerals  1 tablet Oral Daily   NIFEdipine  30 mg Oral Daily   pantoprazole  40 mg Oral BID    Continuous Infusions:  sodium chloride Stopped (05/11/23 0650)   methocarbamol (ROBAXIN) IV      PRN Medications:  albuterol, bisacodyl, fluticasone, guaiFENesin-dextromethorphan, HYDROmorphone (DILAUDID) injection, methocarbamol **OR** methocarbamol (ROBAXIN) IV, metoCLOPramide **OR** metoCLOPramide (REGLAN) injection, ondansetron **OR** ondansetron (ZOFRAN) IV, oxyCODONE, oxyCODONE, senna-docusate, sodium phosphate  Antimicrobials from admission:  Anti-infectives (From admission, onward)    Start     Dose/Rate Route Frequency Ordered Stop   05/07/23 2000  ceFAZolin (ANCEF) IVPB 2g/100 mL premix        2 g 200 mL/hr over 30 Minutes Intravenous Every 6 hours 05/07/23 1535 05/08/23 1533   05/07/23 1130  ceFAZolin (ANCEF) IVPB 2g/100 mL premix        2 g 200 mL/hr over 30 Minutes Intravenous On call to O.R. 05/06/23 2207 05/08/23 1533           Data Reviewed:  I  have personally reviewed the following...  CBC: Recent Labs  Lab 05/06/23 2027 05/08/23 0548 05/09/23 0536 05/10/23 0530  WBC 6.9 9.7 8.9 8.3  NEUTROABS 4.5  --   --   --   HGB 13.8 11.2* 10.7* 10.8*  HCT 39.7 32.4* 29.6* 29.9*  MCV 93.4 91.0 90.2 90.1  PLT 140* 128* 120* 145*   Basic Metabolic Panel: Recent Labs  Lab 05/06/23 2027 05/08/23 0548 05/09/23 0536 05/10/23 0530  NA 135 136 135 135  K 4.0 3.6 4.0 3.9  CL 106 99 104 104  CO2 21* 24 24 24   GLUCOSE 160* 124* 165* 147*  BUN 17 16 14 14   CREATININE 0.62 0.65 0.61 0.65  CALCIUM 8.3* 8.6* 8.9 8.8*   GFR: Estimated Creatinine Clearance: 34.6 mL/min (by C-G formula based on  SCr of 0.65 mg/dL). Liver Function Tests: No results for input(s): "AST", "ALT", "ALKPHOS", "BILITOT", "PROT", "ALBUMIN" in the last 168 hours. No results for input(s): "LIPASE", "AMYLASE" in the last 168 hours. No results for input(s): "AMMONIA" in the last 168 hours. Coagulation Profile: No results for input(s): "INR", "PROTIME" in the last 168 hours. Cardiac Enzymes: No results for input(s): "CKTOTAL", "CKMB", "CKMBINDEX", "TROPONINI" in the last 168 hours. BNP (last 3 results) No results for input(s): "PROBNP" in the last 8760 hours. HbA1C: No results for input(s): "HGBA1C" in the last 72 hours.  CBG: Recent Labs  Lab 05/10/23 1942 05/10/23 2343 05/11/23 0414 05/11/23 0742 05/11/23 1131  GLUCAP 156* 95 130* 166* 174*   Lipid Profile: No results for input(s): "CHOL", "HDL", "LDLCALC", "TRIG", "CHOLHDL", "LDLDIRECT" in the last 72 hours. Thyroid Function Tests: No results for input(s): "TSH", "T4TOTAL", "FREET4", "T3FREE", "THYROIDAB" in the last 72 hours. Anemia Panel: No results for input(s): "VITAMINB12", "FOLATE", "FERRITIN", "TIBC", "IRON", "RETICCTPCT" in the last 72 hours. Most Recent Urinalysis On File:     Component Value Date/Time   COLORURINE YELLOW (A) 05/06/2023 2027   APPEARANCEUR CLEAR (A) 05/06/2023 2027    APPEARANCEUR Clear 04/01/2014 0246   LABSPEC 1.009 05/06/2023 2027   LABSPEC 1.006 04/01/2014 0246   PHURINE 6.0 05/06/2023 2027   GLUCOSEU 50 (A) 05/06/2023 2027   GLUCOSEU >=500 04/01/2014 0246   HGBUR NEGATIVE 05/06/2023 2027   BILIRUBINUR NEGATIVE 05/06/2023 2027   BILIRUBINUR Negative 04/01/2014 0246   KETONESUR NEGATIVE 05/06/2023 2027   PROTEINUR NEGATIVE 05/06/2023 2027   NITRITE NEGATIVE 05/06/2023 2027   LEUKOCYTESUR MODERATE (A) 05/06/2023 2027   LEUKOCYTESUR Negative 04/01/2014 0246   Sepsis Labs: @LABRCNTIP (procalcitonin:4,lacticidven:4) Microbiology: Recent Results (from the past 240 hour(s))  SARS Coronavirus 2 by RT PCR (hospital order, performed in [facility] hospital lab) *cepheid single result test* Urine, Clean Catch     Status: Abnormal   Collection Time: 05/06/23  8:27 PM   Specimen: Urine, Clean Catch; Nasal Swab  Result Value Ref Range Status   SARS Coronavirus 2 by RT PCR POSITIVE (A) NEGATIVE Final    Comment: (NOTE) SARS-CoV-2 target nucleic acids are DETECTED  SARS-CoV-2 RNA is generally detectable in upper respiratory specimens  during the acute phase of infection.  Positive results are indicative  of the presence of the identified virus, but do not rule out bacterial infection or co-infection with other pathogens not detected by the test.  Clinical correlation with patient history and  other diagnostic information is necessary to determine patient infection status.  The expected result is negative.  Fact Sheet for Patients:   RoadLapTop.co.za   Fact Sheet for Healthcare Providers:   http://kim-miller.com/    This test is not yet approved or cleared by the [place] FDA and  has been authorized for detection and/or diagnosis of SARS-CoV-2 by FDA under an Emergency Use Authorization (EUA).  This EUA will remain in effect (meaning this test can be used) for the duration of  the COVID-19  declaration under Section 564(b)(1)  of the Act, 21 U.S.C. section 360-bbb-3(b)(1), unless the authorization is terminated or revoked sooner.   Performed at [facility], [address] [place], [place] [address]       Radiology Studies last 3 days: DG HIP UNILAT WITH PELVIS 2-3 VIEWS LEFT  Result Date: 05/07/2023 CLINICAL DATA:  Surgical internal fixation of proximal left femoral fracture. EXAM: DG HIP (WITH OR WITHOUT PELVIS) 2-3V LEFT; DG C-ARM 1-60 MIN-NO REPORT Fluoroscopy time:  1 minute 24 seconds. COMPARISON:  May 06, 2023. FINDINGS: Six intraoperative fluoroscopic images were obtained the left hip during surgical internal fixation of proximal left femoral fracture. IMPRESSION: Fluoroscopic guidance provided during surgical internal fixation of proximal left femoral fracture. Electronically Signed   By: Lupita Raider M.D.   On: 05/07/2023 15:02   DG C-Arm 1-60 Min-No Report  Result Date: 05/07/2023 Fluoroscopy was utilized by the requesting physician.  No radiographic interpretation.             LOS: 5 days      Sunnie Nielsen, DO [facility] 05/11/2023, 1:26 PM    Dictation software may have been used to generate the above note. Typos may occur and escape review in typed/dictated notes. Please contact Dr Lyn Hollingshead directly for clarity if needed.  Staff may message me via secure chat in Epic  but this may not receive an immediate response,  please page me for urgent matters!  If 7PM-7AM, please contact night coverage [email or web address]

## 2023-05-11 NOTE — Progress Notes (Signed)
Physical Therapy Treatment Patient Details Name: Rachael Humphrey MRN: [id number] DOB: 07-27-1930 Today's Date: 05/11/2023   History of Present Illness 89 y/o female presented to ED on 05/06/23 after fall while outside watering flowers. Sustained acute mildly impacted comminuted intertrochanteric fracture of L femur. S/p L femur IM nail on 8/22. Tested (+) for COVID. PMH: CAD s/p PCI, hx of colon cancer, HTN, prior falls, hx of kyphoplasty, T2DM    PT Comments  Pt received Tylenol prior to session. Completed gait training with RW in room to/from bathroom with CG/MinA for safety. 8/10 L hip pain with activity, nursing notified. Pt remains very motivated to work with PT and return to previous independent function. Currently awaiting short term rehab placement. Will continue to benefit from acute PT services.   If plan is discharge home, recommend the following: A little help with walking and/or transfers;A little help with bathing/dressing/bathroom;Assistance with cooking/housework;Direct supervision/assist for medications management;Direct supervision/assist for financial management;Assist for transportation;Help with stairs or ramp for entrance;Supervision due to cognitive status   Can travel by private vehicle     Yes  Equipment Recommendations  Rolling walker (2 wheels) (Youth RW)    Recommendations for Other Services       Precautions / Restrictions Precautions Precautions: Fall Restrictions Weight Bearing Restrictions: Yes LLE Weight Bearing: Weight bearing as tolerated     Mobility  Bed Mobility Overal bed mobility: Needs Assistance Bed Mobility: Supine to Sit     Supine to sit: Min assist, HOB elevated          Transfers Overall transfer level: Needs assistance Equipment used: Rolling walker (2 wheels) Transfers: Sit to/from Stand Sit to Stand: Min assist           General transfer comment: Cues for technique with MinA to raise from low bed     Ambulation/Gait Ambulation/Gait assistance: Min assist, Contact guard assist Gait Distance (Feet): 40 Feet Assistive device: Rolling walker (2 wheels) Gait Pattern/deviations: Step-to pattern, Decreased stride length, Decreased stance time - left, Decreased weight shift to left, Antalgic Gait velocity: decreased     General Gait Details: 20' x 1, 40' x 1 (youth RW)   Stairs             Wheelchair Mobility     Tilt Bed    Modified Rankin (Stroke Patients Only)       Balance Overall balance assessment: Needs assistance, History of Falls Sitting-balance support: Feet supported, No upper extremity supported Sitting balance-Leahy Scale: Fair     Standing balance support: During functional activity, Bilateral upper extremity supported Standing balance-Leahy Scale: Fair                              Cognition Arousal: Alert Behavior During Therapy: WFL for tasks assessed/performed Overall Cognitive Status: Within Functional Limits for tasks assessed                                 General Comments: Very pleasant and motivated        Exercises General Exercises - Lower Extremity Ankle Circles/Pumps: AROM, Both, 10 reps Long Arc Quad: AROM, Both, 10 reps, Seated Heel Slides: AAROM, Left, 5 reps, Supine    General Comments General comments (skin integrity, edema, etc.):  (L hip dressing intact. 8/10 pain after receiving Tylenol)      Pertinent Vitals/Pain Pain Assessment Pain Assessment: 0-10 Pain  Score: 8  Pain Location: L hip Pain Descriptors / Indicators: Discomfort, Grimacing, Operative site guarding, Sore Pain Intervention(s): Patient requesting pain meds-RN notified    Home Living                          Prior Function            PT Goals (current goals can now be found in the care plan section) Acute Rehab PT Goals Patient Stated Goal:  (Get stronger) Progress towards PT goals: Progressing toward  goals    Frequency    Min 1X/week      PT Plan      Co-evaluation              AM-PAC PT "6 Clicks" Mobility   Outcome Measure  Help needed turning from your back to your side while in a flat bed without using bedrails?: A Little Help needed moving from lying on your back to sitting on the side of a flat bed without using bedrails?: A Little Help needed moving to and from a bed to a chair (including a wheelchair)?: A Little Help needed standing up from a chair using your arms (e.g., wheelchair or bedside chair)?: A Little Help needed to walk in hospital room?: A Little Help needed climbing 3-5 steps with a railing? : A Lot 6 Click Score: 17    End of Session Equipment Utilized During Treatment: Gait belt Activity Tolerance: Patient tolerated treatment well Patient left: in bed;with bed alarm set;with call bell/phone within reach;with family/visitor present Nurse Communication: Mobility status PT Visit Diagnosis: Unsteadiness on feet (R26.81);Muscle weakness (generalized) (M62.81);History of falling (Z91.81);Other abnormalities of gait and mobility (R26.89);Difficulty in walking, not elsewhere classified (R26.2)     Time: 0930-1002 PT Time Calculation (min) (ACUTE ONLY): 32 min  Charges:    $Gait Training: 8-22 mins $Therapeutic Exercise: 8-22 mins PT General Charges $$ ACUTE PT VISIT: 1 Visit                    Zadie Cleverly, PTA  Jannet Askew 05/11/2023, 10:22 AM

## 2023-05-11 NOTE — Plan of Care (Signed)
  Problem: Nutritional: Goal: Maintenance of adequate nutrition will improve Outcome: Progressing Goal: Progress toward achieving an optimal weight will improve Outcome: Progressing   Problem: Skin Integrity: Goal: Risk for impaired skin integrity will decrease Outcome: Progressing   Problem: Education: Goal: Knowledge of risk factors and measures for prevention of condition will improve Outcome: Progressing   Problem: Coping: Goal: Psychosocial and spiritual needs will be supported Outcome: Progressing   Problem: Clinical Measurements: Goal: Cardiovascular complication will be avoided Outcome: Progressing   Problem: Activity: Goal: Risk for activity intolerance will decrease Outcome: Progressing

## 2023-05-11 NOTE — Progress Notes (Signed)
Physical Therapy Treatment Patient Details Name: Rachael Humphrey MRN: [id number] DOB: [birth year] Today's Date: 05/11/2023   History of Present Illness 89 y/o female presented to ED on 05/06/23 after fall while outside watering flowers. Sustained acute mildly impacted comminuted intertrochanteric fracture of L femur. S/p L femur IM nail on 8/22. Tested (+) for COVID. PMH: CAD s/p PCI, hx of colon cancer, HTN, prior falls, hx of kyphoplasty, T2DM    PT Comments  Pt seen this pm for progressive gait training in hallway with RW, CGA x 63ft with improved step through gait with less c/o pain after pre-medicating with Oxy. Pt encourage to increase L LE toe out during gait. Will continue to progress acutely.  SpO2 remained in upper 90's throughout session, no SOB.   If plan is discharge home, recommend the following: A little help with walking and/or transfers;A little help with bathing/dressing/bathroom;Assistance with cooking/housework;Direct supervision/assist for medications management;Direct supervision/assist for financial management;Assist for transportation;Help with stairs or ramp for entrance;Supervision due to cognitive status   Can travel by private vehicle     Yes  Equipment Recommendations  Other (comment);Rolling walker (2 wheels) (Youth size)    Recommendations for Other Services       Precautions / Restrictions Precautions Precautions: Fall Restrictions Weight Bearing Restrictions: Yes LLE Weight Bearing: Weight bearing as tolerated     Mobility  Bed Mobility Overal bed mobility: Needs Assistance Bed Mobility: Supine to Sit     Supine to sit: Min assist, HOB elevated     General bed mobility comments: Pt up in chair pre/post session    Transfers Overall transfer level: Needs assistance Equipment used: Rolling walker (2 wheels) Transfers: Sit to/from Stand Sit to Stand: Min assist, Contact guard assist           General transfer comment: Improved ability to  complete transfers this pm    Ambulation/Gait Ambulation/Gait assistance: Contact guard assist Gait Distance (Feet): 85 Feet Assistive device: Rolling walker (2 wheels) Gait Pattern/deviations: Step-through pattern, Antalgic Gait velocity: decreased     General Gait Details:  (Increased gait distance this pm)   Stairs             Wheelchair Mobility     Tilt Bed    Modified Rankin (Stroke Patients Only)       Balance Overall balance assessment: Needs assistance, History of Falls Sitting-balance support: Feet supported, No upper extremity supported Sitting balance-Leahy Scale: Good     Standing balance support: Bilateral upper extremity supported, During functional activity, Reliant on assistive device for balance Standing balance-Leahy Scale: Fair                              Cognition Arousal: Alert Behavior During Therapy: WFL for tasks assessed/performed Overall Cognitive Status: Within Functional Limits for tasks assessed                                 General Comments: Very pleasant and motivated        Exercises General Exercises - Lower Extremity Ankle Circles/Pumps: AROM, Both, 10 reps Long Arc Quad: AROM, Both, 10 reps, Seated Heel Slides: AAROM, Left, 5 reps, Supine    General Comments General comments (skin integrity, edema, etc.): Pt and family educted on benefits of short term rehab prior to returning home.      Pertinent Vitals/Pain Pain Assessment Pain Assessment: 0-10 Pain  Score: 4  Pain Location: L hip Pain Descriptors / Indicators: Discomfort, Operative site guarding, Sore Pain Intervention(s): Premedicated before session    Home Living                          Prior Function            PT Goals (current goals can now be found in the care plan section) Acute Rehab PT Goals Patient Stated Goal: go to rehab and then home Progress towards PT goals: Progressing toward goals     Frequency    Min 1X/week      PT Plan      Co-evaluation              AM-PAC PT "6 Clicks" Mobility   Outcome Measure  Help needed turning from your back to your side while in a flat bed without using bedrails?: A Little Help needed moving from lying on your back to sitting on the side of a flat bed without using bedrails?: A Little Help needed moving to and from a bed to a chair (including a wheelchair)?: A Little Help needed standing up from a chair using your arms (e.g., wheelchair or bedside chair)?: A Little Help needed to walk in hospital room?: A Little Help needed climbing 3-5 steps with a railing? : A Lot 6 Click Score: 17    End of Session Equipment Utilized During Treatment: Gait belt Activity Tolerance: Patient tolerated treatment well Patient left: in chair;with call bell/phone within reach;with family/visitor present Nurse Communication: Mobility status PT Visit Diagnosis: Unsteadiness on feet (R26.81);Muscle weakness (generalized) (M62.81);History of falling (Z91.81);Other abnormalities of gait and mobility (R26.89);Difficulty in walking, not elsewhere classified (R26.2)     Time: 1610-9604 PT Time Calculation (min) (ACUTE ONLY): 16 min  Charges:    $Gait Training: 8-22 mins PT General Charges $$ ACUTE PT VISIT: 1 Visit                    Zadie Cleverly, PTA  Jannet Askew 05/11/2023, 3:42 PM

## 2023-05-11 NOTE — Anesthesia Postprocedure Evaluation (Signed)
Anesthesia Post Note  Patient: ROCKELL ARBUTHNOT  Procedure(s) Performed: INTRAMEDULLARY (IM) NAIL INTERTROCHANTERIC (Left)  Patient location: OR. Anesthesia Type: General Level of consciousness: awake and alert Pain management: pain level controlled Vital Signs Assessment: post-procedure vital signs reviewed and stable Respiratory status: spontaneous breathing, nonlabored ventilation, respiratory function stable and patient connected to nasal cannula oxygen Cardiovascular status: blood pressure returned to baseline and stable Postop Assessment: no apparent nausea or vomiting Anesthetic complications: no Comments: COVID recovery in the OR   No notable events documented.   Last Vitals:  Vitals:   05/10/23 1017 05/11/23 0107  BP: 112/66 128/77  Pulse: 100 72  Resp: 16 16  Temp: 36.7 C 36.7 C  SpO2: 100% 96%    Last Pain:  Vitals:   05/10/23 2323  TempSrc:   PainSc: 5                  Cleda Mccreedy Dorethea Strubel

## 2023-05-11 NOTE — Plan of Care (Signed)
  Problem: Respiratory: Goal: Will maintain a patent airway Outcome: Progressing   Problem: Health Behavior/Discharge Planning: Goal: Ability to manage health-related needs will improve Outcome: Progressing   Problem: Clinical Measurements: Goal: Ability to maintain clinical measurements within normal limits will improve Outcome: Progressing Goal: Diagnostic test results will improve Outcome: Progressing Goal: Respiratory complications will improve Outcome: Progressing Goal: Cardiovascular complication will be avoided Outcome: Progressing   Problem: Activity: Goal: Risk for activity intolerance will decrease Outcome: Progressing   Problem: Nutrition: Goal: Adequate nutrition will be maintained Outcome: Progressing   Problem: Elimination: Goal: Will not experience complications related to bowel motility Outcome: Progressing Goal: Will not experience complications related to urinary retention Outcome: Progressing   Problem: Pain Managment: Goal: General experience of comfort will improve Outcome: Progressing   Problem: Safety: Goal: Ability to remain free from injury will improve Outcome: Progressing

## 2023-05-12 DIAGNOSIS — S72142A Displaced intertrochanteric fracture of left femur, initial encounter for closed fracture: Secondary | ICD-10-CM | POA: Diagnosis not present

## 2023-05-12 LAB — CBC
HCT: 32 % — ABNORMAL LOW (ref 36.0–46.0)
Hemoglobin: 11.1 g/dL — ABNORMAL LOW (ref 12.0–15.0)
MCH: 31.8 pg (ref 26.0–34.0)
MCHC: 34.7 g/dL (ref 30.0–36.0)
MCV: 91.7 fL (ref 80.0–100.0)
Platelets: 237 10*3/uL (ref 150–400)
RBC: 3.49 MIL/uL — ABNORMAL LOW (ref 3.87–5.11)
RDW: 12.9 % (ref 11.5–15.5)
WBC: 6.9 10*3/uL (ref 4.0–10.5)
nRBC: 0 % (ref 0.0–0.2)

## 2023-05-12 LAB — GLUCOSE, CAPILLARY
Glucose-Capillary: 97 mg/dL (ref 70–99)
Glucose-Capillary: 133 mg/dL — ABNORMAL HIGH (ref 70–99)
Glucose-Capillary: 168 mg/dL — ABNORMAL HIGH (ref 70–99)

## 2023-05-12 MED ORDER — DOCUSATE SODIUM 100 MG PO CAPS: 100.0000 mg | ORAL_CAPSULE | Freq: Two times a day (BID) | ORAL | Status: AC

## 2023-05-12 MED ORDER — ENSURE ENLIVE PO LIQD: 237.0000 mL | Freq: Two times a day (BID) | ORAL | Status: AC

## 2023-05-12 MED ORDER — ALBUTEROL SULFATE HFA 108 (90 BASE) MCG/ACT IN AERS: 1.0000 | INHALATION_SPRAY | RESPIRATORY_TRACT | Status: AC | PRN

## 2023-05-12 MED ORDER — GUAIFENESIN-DM 100-10 MG/5ML PO SYRP: 5.0000 mL | ORAL_SOLUTION | ORAL | Status: AC | PRN

## 2023-05-12 MED ORDER — BISACODYL 10 MG RE SUPP: 10.0000 mg | Freq: Every day | RECTAL | Status: AC | PRN

## 2023-05-12 NOTE — Progress Notes (Signed)
Physical Therapy Treatment Patient Details Name: Rachael Humphrey MRN: [id number] DOB: 1930/03/09 Today's Date: 05/12/2023   History of Present Illness 89 y/o female presented to ED on 05/06/23 after fall while outside watering flowers. Sustained acute mildly impacted comminuted intertrochanteric fracture of L femur. S/p L femur IM nail on 8/22. Tested (+) for COVID. PMH: CAD s/p PCI, hx of colon cancer, HTN, prior falls, hx of kyphoplasty, T2DM    PT Comments  Limited tolerance for mobility this date due to pain. Tylenol and Robaxin did not provide relief during yesterday's am session, in pm after receiving Oxy, pt was able to ambulate around nursing station with minimal discomfort. Therefore session limited to short distance gait. Pt will continue to benefit from PT services post d/c. Possible transition today to SNF prior to returning home.    If plan is discharge home, recommend the following: A little help with walking and/or transfers;A little help with bathing/dressing/bathroom;Assistance with cooking/housework;Direct supervision/assist for medications management;Direct supervision/assist for financial management;Assist for transportation;Help with stairs or ramp for entrance;Supervision due to cognitive status   Can travel by private vehicle     Yes  Equipment Recommendations  Rolling walker (2 wheels) (Youth RW)    Recommendations for Other Services       Precautions / Restrictions Precautions Precautions: Fall Restrictions Weight Bearing Restrictions: Yes LLE Weight Bearing: Weight bearing as tolerated     Mobility  Bed Mobility Overal bed mobility: Needs Assistance Bed Mobility: Supine to Sit     Supine to sit: Min assist, HOB elevated     General bed mobility comments: Pt up in chair pre/post session    Transfers Overall transfer level: Needs assistance Equipment used: Rolling walker (2 wheels) Transfers: Sit to/from Stand Sit to Stand: Min assist, Contact guard  assist           General transfer comment:  (Increased pain in standing, able to stand from toilet with wall rail and supervision)    Ambulation/Gait Ambulation/Gait assistance: Contact guard assist Gait Distance (Feet): 25 Feet Assistive device: Rolling walker (2 wheels) Gait Pattern/deviations: Step-to pattern, Decreased weight shift to left, Antalgic Gait velocity: decreased     General Gait Details: 20' x 1, 40' x 1 (youth RW)   Stairs             Wheelchair Mobility     Tilt Bed    Modified Rankin (Stroke Patients Only)       Balance Overall balance assessment: Needs assistance, History of Falls Sitting-balance support: Feet supported, No upper extremity supported Sitting balance-Leahy Scale: Good     Standing balance support: Bilateral upper extremity supported, During functional activity, Reliant on assistive device for balance Standing balance-Leahy Scale: Fair Standing balance comment: able to complete bathing at sink with no UE support                            Cognition Arousal: Alert Behavior During Therapy: WFL for tasks assessed/performed Overall Cognitive Status: Within Functional Limits for tasks assessed                                 General Comments: Very pleasant and motivated        Exercises      General Comments General comments (skin integrity, edema, etc.): Pt received Tylenol prior to session which does not control her pain level.  Pertinent Vitals/Pain Pain Assessment Pain Assessment: 0-10 Pain Score: 8  Pain Location: L hip Pain Descriptors / Indicators: Discomfort, Operative site guarding, Sore Pain Intervention(s): Limited activity within patient's tolerance    Home Living                          Prior Function            PT Goals (current goals can now be found in the care plan section) Acute Rehab PT Goals Patient Stated Goal: go to rehab and then home Progress  towards PT goals: Progressing toward goals    Frequency    Min 1X/week      PT Plan      Co-evaluation              AM-PAC PT "6 Clicks" Mobility   Outcome Measure  Help needed turning from your back to your side while in a flat bed without using bedrails?: A Little Help needed moving from lying on your back to sitting on the side of a flat bed without using bedrails?: A Little Help needed moving to and from a bed to a chair (including a wheelchair)?: A Little Help needed standing up from a chair using your arms (e.g., wheelchair or bedside chair)?: A Little Help needed to walk in hospital room?: A Little Help needed climbing 3-5 steps with a railing? : A Lot 6 Click Score: 17    End of Session Equipment Utilized During Treatment: Gait belt Activity Tolerance: Patient limited by pain Patient left: in chair;with call bell/phone within reach;with family/visitor present Nurse Communication: Mobility status;Patient requests pain meds PT Visit Diagnosis: Unsteadiness on feet (R26.81);Muscle weakness (generalized) (M62.81);History of falling (Z91.81);Other abnormalities of gait and mobility (R26.89);Difficulty in walking, not elsewhere classified (R26.2)     Time: 1204-1228 PT Time Calculation (min) (ACUTE ONLY): 24 min  Charges:    $Gait Training: 8-22 mins $Therapeutic Activity: 8-22 mins PT General Charges $$ ACUTE PT VISIT: 1 Visit                    Zadie Cleverly, PTA  Jannet Askew 05/12/2023, 12:43 PM

## 2023-05-12 NOTE — Discharge Summary (Signed)
Physician Discharge Summary   Patient: Rachael Humphrey MRN: [id number]  DOB: 08-08-1930   Admit:     Date of Admission: 05/06/2023 Admitted from: home   Discharge: Date of discharge: 05/12/23 Disposition: Skilled nursing facility Condition at discharge: good  CODE STATUS: DNR     Discharge Physician: Sunnie Nielsen, DO [facility]     PCP: Marguarite Arbour, MD  Recommendations for Outpatient Follow-up:  Follow up with PCP Marguarite Arbour, MD in 2-4 weeks Follow w/ [facility] as below     Discharge Instructions     Diet - low sodium heart healthy   Complete by: As directed    Increase activity slowly   Complete by: As directed          Discharge Diagnoses: Principal Problem:   Closed comminuted intertrochanteric fracture of proximal end of left femur, initial encounter (HCC) Active Problems:   Accidental fall   COVID-19 virus infection   Acquired hypothyroidism   Coronary artery disease   Benign essential hypertension   History of TIA (transient ischemic attack)   Type 2 diabetes mellitus without complication [facility])       Hospital Course:  Rachael Humphrey is a 89 y.o. female with medical history significant for CAD s/p PCI, history of colon cancer, HTN, hypothyroidism, kyphoplasty and prior falls, who presents to the ED following an accidental fall while gardening.  Patient was recently diagnosed with COVID after having some mild respiratory symptoms and was improving and felt well enough to go out gardening.  She fell as she tried to turn around.  08/21: to ED, (+)acute mildly impacted comminuted intertrochanteric fracture of the left femur. Admitted to hospitalist service. Ortho planning surgery  08/22: to OR for intramedullary nail L fenur fx.  08/23 (Fri): PT/OT recs for SNF rehab, TOC to work on this, expect pt will be here thru the weekend  08/24-08/26 (Sat/Sun/Mon): stable, no concerns  08/27: ins auth approved for [facility], pt ready to  discharge today   Consultants:  Orthopedic surgery   Procedures: 05/07/23 intramedullary nail L femur fracture       ASSESSMENT & PLAN:   Closed comminuted intertrochanteric fracture of proximal end of left femur, initial encounter (HCC) Accidental fall Preoperative clearance - At low/optimized risk of perioperative cardiopulmonary events Patient has a history of CAD and has been symptom-free.  Last saw her cardiologist in February 2024 and was given a 17-month follow-up.  Also has a history of prior TIA maintained on Plavix and atorvastatin S/p intramedullary nail placement  Pain control SNF rehab  Ortho follow-up in 2 weeks at [facility] orthopedics for staple removal and x-rays of the left hip.   DVT Prophylaxis - Aspirin, TED hose, and Plavix Weight-Bearing as tolerated to left leg  Recent diagnosis of COVID  just over a week ago but symptomatic only for weakness that has been improving, she did have (+)test here on 08/21 Respiratory precautions per protocol   Acquired hypothyroidism Continue levothyroxine  Coronary artery disease Patient with no anginal pain.  EKG nonacute Continue atorvastatin and metoprolol Reduced metoprolol d/t borderline low  held Plavix for surgery, restarted    Benign essential hypertension Blood pressure controlled Reduced metoprolol d/t borderline low BP   History of TIA (transient ischemic attack) Continue atorvastatin, plavix   Type 2 diabetes mellitus without complication (HCC) Sliding scale insulin coverage             Discharge Instructions  Allergies as of  05/12/2023       Reactions   Codeine Nausea And Vomiting   Darvon [propoxyphene] Other (See Comments)   Altered mental status    Demerol [meperidine] Other (See Comments)   Altered mental status    Gabapentin Other (See Comments)   Sulfa Antibiotics Other (See Comments)   Tramadol Other (See Comments)   "makes me crazy"  "makes me crazy"    Voltaren  [diclofenac Sodium] Other (See Comments)   "Makes me crazy" "Makes me crazy"   Zinc    Hurting in lower stomach         Medication List     TAKE these medications    acetaminophen 500 MG tablet Commonly known as: TYLENOL Take 2 tablets (1,000 mg total) by mouth every 8 (eight) hours as needed.   albuterol 108 (90 Base) MCG/ACT inhaler Commonly known as: VENTOLIN HFA Inhale 1 puff into the lungs every 4 (four) hours as needed for wheezing or shortness of breath.   aspirin EC 325 MG tablet Take 1 tablet (325 mg total) by mouth daily.   atorvastatin 20 MG tablet Commonly known as: LIPITOR Take 20 mg by mouth daily.   bisacodyl 10 MG suppository Commonly known as: DULCOLAX Place 1 suppository (10 mg total) rectally daily as needed for moderate constipation.   CALCIUM 600 + D PO Take 1 tablet by mouth daily.   cetirizine 10 MG tablet Commonly known as: ZYRTEC Take 10 mg by mouth daily.   clopidogrel 75 MG tablet Commonly known as: PLAVIX Take 75 mg by mouth daily.   docusate sodium 100 MG capsule Commonly known as: COLACE Take 1 capsule (100 mg total) by mouth 2 (two) times daily.   feeding supplement Liqd Take 237 mLs by mouth 2 (two) times daily between meals.   fluticasone 50 MCG/ACT nasal spray Commonly known as: FLONASE Place 2 sprays into both nostrils daily as needed for allergies.   guaiFENesin-dextromethorphan 100-10 MG/5ML syrup Commonly known as: ROBITUSSIN DM Take 5 mLs by mouth every 4 (four) hours as needed for cough.   levothyroxine 25 MCG tablet Commonly known as: SYNTHROID Take 25 mcg by mouth daily before breakfast.   methocarbamol 500 MG tablet Commonly known as: ROBAXIN Take 1 tablet (500 mg total) by mouth every 6 (six) hours as needed for muscle spasms.   metoprolol succinate 50 MG 24 hr tablet Commonly known as: TOPROL-XL Take 75 mg by mouth every morning.   montelukast 10 MG tablet Commonly known as: SINGULAIR Take 10 mg by  mouth at bedtime.   multivitamin with minerals Tabs tablet Take 1 tablet by mouth daily.   NIFEdipine 30 MG 24 hr tablet Commonly known as: PROCARDIA-XL/NIFEDICAL-XL Take 30 mg by mouth daily.   ondansetron 4 MG tablet Commonly known as: ZOFRAN Take 1 tablet (4 mg total) by mouth every 6 (six) hours as needed for nausea.   oxyCODONE 5 MG immediate release tablet Commonly known as: Oxy IR/ROXICODONE Take 0.5-1 tablets (2.5-5 mg total) by mouth every 4 (four) hours as needed for moderate pain (pain score 4-6).   pantoprazole 40 MG tablet Commonly known as: PROTONIX Take 1 tablet by mouth 2 (two) times daily.         Contact information for follow-up providers     Dedra Skeens, PA-C Follow up in 2 week(s).   Specialty: Orthopedic Surgery Why: For staple removal and x-rays of the left hip Contact information: [address] [place] [address] [phone]  Contact information for after-discharge care     Destination     [facility] .   Service: Skilled Nursing Contact information: [address] [place] [address] [phone]                     Allergies  Allergen Reactions   Codeine Nausea And Vomiting   Darvon [Propoxyphene] Other (See Comments)    Altered mental status     Demerol [Meperidine] Other (See Comments)    Altered mental status     Gabapentin Other (See Comments)   Sulfa Antibiotics Other (See Comments)   Tramadol Other (See Comments)    "makes me crazy"  "makes me crazy"    Voltaren [Diclofenac Sodium] Other (See Comments)    "Makes me crazy" "Makes me crazy"   Zinc     Hurting in lower stomach      Subjective: pt feeling well today, just want to get out of here! Denies chest pain, tolerating diet, pain is control    Discharge Exam: BP 136/83   Pulse 85   Temp 98.4 F (36.9 C)   Resp 18   Ht 5\' 1"  (1.549 m)   Wt 55.9 kg   SpO2  100%   BMI 23.29 kg/m  General: Pt is alert, awake, not in acute distress Cardiovascular: RRR, S1/S2 +, no rubs, no gallops Respiratory: CTA bilaterally, no wheezing, no rhonchi Abdominal: Soft, NT, ND, bowel sounds + Extremities: no edema, no cyanosis     The results of significant diagnostics from this hospitalization (including imaging, microbiology, ancillary and laboratory) are listed below for reference.     Microbiology: Recent Results (from the past 240 hour(s))  SARS Coronavirus 2 by RT PCR (hospital order, performed in [facility] hospital lab) *cepheid single result test* Urine, Clean Catch     Status: Abnormal   Collection Time: 05/06/23  8:27 PM   Specimen: Urine, Clean Catch; Nasal Swab  Result Value Ref Range Status   SARS Coronavirus 2 by RT PCR POSITIVE (A) NEGATIVE Final    Comment: (NOTE) SARS-CoV-2 target nucleic acids are DETECTED  SARS-CoV-2 RNA is generally detectable in upper respiratory specimens  during the acute phase of infection.  Positive results are indicative  of the presence of the identified virus, but do not rule out bacterial infection or co-infection with other pathogens not detected by the test.  Clinical correlation with patient history and  other diagnostic information is necessary to determine patient infection status.  The expected result is negative.  Fact Sheet for Patients:   RoadLapTop.co.za   Fact Sheet for Healthcare Providers:   http://kim-miller.com/    This test is not yet approved or cleared by the [place] FDA and  has been authorized for detection and/or diagnosis of SARS-CoV-2 by FDA under an Emergency Use Authorization (EUA).  This EUA will remain in effect (meaning this test can be used) for the duration of  the COVID-19 declaration under Section 564(b)(1)  of the Act, 21 U.S.C. section 360-bbb-3(b)(1), unless the authorization is terminated or revoked  sooner.   Performed at [facility], [address] [place], [place] [address]      Labs: BNP (last 3 results) No results for input(s): "BNP" in the last 8760 hours. Basic Metabolic Panel: Recent Labs  Lab 05/06/23 2027 05/08/23 0548 05/09/23 0536 05/10/23 0530  NA 135 136 135 135  K 4.0 3.6 4.0 3.9  CL 106 99 104 104  CO2 21* 24 24 24   GLUCOSE 160* 124* 165* 147*  BUN 17 16 14 14   CREATININE 0.62 0.65 0.61 0.65  CALCIUM 8.3* 8.6* 8.9 8.8*   Liver Function Tests: No results for input(s): "AST", "ALT", "ALKPHOS", "BILITOT", "PROT", "ALBUMIN" in the last 168 hours. No results for input(s): "LIPASE", "AMYLASE" in the last 168 hours. No results for input(s): "AMMONIA" in the last 168 hours. CBC: Recent Labs  Lab 05/06/23 2027 05/08/23 0548 05/09/23 0536 05/10/23 0530 05/12/23 0513  WBC 6.9 9.7 8.9 8.3 6.9  NEUTROABS 4.5  --   --   --   --   HGB 13.8 11.2* 10.7* 10.8* 11.1*  HCT 39.7 32.4* 29.6* 29.9* 32.0*  MCV 93.4 91.0 90.2 90.1 91.7  PLT 140* 128* 120* 145* 237   Cardiac Enzymes: No results for input(s): "CKTOTAL", "CKMB", "CKMBINDEX", "TROPONINI" in the last 168 hours. BNP: Invalid input(s): "POCBNP" CBG: Recent Labs  Lab 05/11/23 1634 05/11/23 2008 05/11/23 2314 05/12/23 0430 05/12/23 0746  GLUCAP 238* 116* 118* 97 133*   D-Dimer No results for input(s): "DDIMER" in the last 72 hours. Hgb A1c No results for input(s): "HGBA1C" in the last 72 hours. Lipid Profile No results for input(s): "CHOL", "HDL", "LDLCALC", "TRIG", "CHOLHDL", "LDLDIRECT" in the last 72 hours. Thyroid function studies No results for input(s): "TSH", "T4TOTAL", "T3FREE", "THYROIDAB" in the last 72 hours.  Invalid input(s): "FREET3" Anemia work up No results for input(s): "VITAMINB12", "FOLATE", "FERRITIN", "TIBC", "IRON", "RETICCTPCT" in the last 72 hours. Urinalysis    Component Value Date/Time   COLORURINE YELLOW (A) 05/06/2023 2027   APPEARANCEUR CLEAR (A)  05/06/2023 2027   APPEARANCEUR Clear 04/01/2014 0246   LABSPEC 1.009 05/06/2023 2027   LABSPEC 1.006 04/01/2014 0246   PHURINE 6.0 05/06/2023 2027   GLUCOSEU 50 (A) 05/06/2023 2027   GLUCOSEU >=500 04/01/2014 0246   HGBUR NEGATIVE 05/06/2023 2027   BILIRUBINUR NEGATIVE 05/06/2023 2027   BILIRUBINUR Negative 04/01/2014 0246   KETONESUR NEGATIVE 05/06/2023 2027   PROTEINUR NEGATIVE 05/06/2023 2027   NITRITE NEGATIVE 05/06/2023 2027   LEUKOCYTESUR MODERATE (A) 05/06/2023 2027   LEUKOCYTESUR Negative 04/01/2014 0246   Sepsis Labs Recent Labs  Lab 05/08/23 0548 05/09/23 0536 05/10/23 0530 05/12/23 0513  WBC 9.7 8.9 8.3 6.9   Microbiology Recent Results (from the past 240 hour(s))  SARS Coronavirus 2 by RT PCR (hospital order, performed in [facility] hospital lab) *cepheid single result test* Urine, Clean Catch     Status: Abnormal   Collection Time: 05/06/23  8:27 PM   Specimen: Urine, Clean Catch; Nasal Swab  Result Value Ref Range Status   SARS Coronavirus 2 by RT PCR POSITIVE (A) NEGATIVE Final    Comment: (NOTE) SARS-CoV-2 target nucleic acids are DETECTED  SARS-CoV-2 RNA is generally detectable in upper respiratory specimens  during the acute phase of infection.  Positive results are indicative  of the presence of the identified virus, but do not rule out bacterial infection or co-infection with other pathogens not detected by the test.  Clinical correlation with patient history and  other diagnostic information is necessary to determine patient infection status.  The expected result is negative.  Fact Sheet for Patients:   RoadLapTop.co.za   Fact Sheet for Healthcare Providers:   http://kim-miller.com/    This test is not yet approved or cleared by the [place] FDA and  has been authorized for detection and/or diagnosis of SARS-CoV-2 by FDA under an Emergency Use Authorization (EUA).  This EUA will remain  in effect  (meaning this test can be used) for the duration of  the COVID-19 declaration under Section 564(b)(1)  of the Act, 21 U.S.C. section 360-bbb-3(b)(1), unless the authorization is terminated or revoked sooner.   Performed at [facility], [address] [place], [place] [address]    Imaging DG HIP UNILAT WITH PELVIS 2-3 VIEWS LEFT  Result Date: 05/07/2023 CLINICAL DATA:  Surgical internal fixation of proximal left femoral fracture. EXAM: DG HIP (WITH OR WITHOUT PELVIS) 2-3V LEFT; DG C-ARM 1-60 MIN-NO REPORT Fluoroscopy time: 1 minute 24 seconds. COMPARISON:  May 06, 2023. FINDINGS: Six intraoperative fluoroscopic images were obtained the left hip during surgical internal fixation of proximal left femoral fracture. IMPRESSION: Fluoroscopic guidance provided during surgical internal fixation of proximal left femoral fracture. Electronically Signed   By: Lupita Raider M.D.   On: 05/07/2023 15:02   DG C-Arm 1-60 Min-No Report  Result Date: 05/07/2023 Fluoroscopy was utilized by the requesting physician.  No radiographic interpretation.   CT Hip Left Wo Contrast  Result Date: 05/06/2023 CLINICAL DATA:  Hip fracture EXAM: CT OF THE LEFT HIP WITHOUT CONTRAST TECHNIQUE: Multidetector CT imaging of the left hip was performed according to the standard protocol. Multiplanar CT image reconstructions were also generated. RADIATION DOSE REDUCTION: This exam was performed according to the departmental dose-optimization program which includes automated exposure control, adjustment of the mA and/or kV according to patient size and/or use of iterative reconstruction technique. COMPARISON:  Left hip x-ray same day FINDINGS: Bones/Joint/Cartilage The bones are diffusely osteopenic. There is an acute mildly impacted comminuted intratrochanteric fracture of the left femur. There is mild apex anterior angulation. There is no dislocation. No focal osseous lesion identified. Ligaments Suboptimally assessed  by CT. Muscles and Tendons There is intramuscular edema surrounding the fracture site. No definitive hematoma. Soft tissues Colonic diverticulosis present. No focal hematoma or fluid collection. IMPRESSION: Acute mildly impacted comminuted intratrochanteric fracture of the left femur. Electronically Signed   By: Darliss Cheney M.D.   On: 05/06/2023 23:04   DG Chest Portable 1 View  Result Date: 05/06/2023 CLINICAL DATA:  Ground level fall and left hip pain.  Preop. EXAM: PORTABLE CHEST 1 VIEW COMPARISON:  07/26/2022 FINDINGS: Stable cardiomediastinal silhouette. Aortic atherosclerotic calcification. Bibasilar atelectasis/scarring. No focal consolidation, pleural effusion, or pneumothorax. No displaced rib fractures. Moderate hiatal hernia. IMPRESSION: No acute cardiopulmonary disease. Moderate hiatal hernia. Electronically Signed   By: Minerva Fester M.D.   On: 05/06/2023 22:33   DG Hip Unilat W or Wo Pelvis 2-3 Views Left  Result Date: 05/06/2023 CLINICAL DATA:  Fall, left hip pain EXAM: DG HIP (WITH OR WITHOUT PELVIS) 2-3V LEFT COMPARISON:  None Available. FINDINGS: The osseous structures are diffusely markedly osteopenic. There is an acute, impacted basicervical/intratrochanteric fracture of the left hip with probable cortical avulsion of the lesser trochanter. Femoral head is still seated within the left acetabulum. Mild bilateral degenerative hip arthritis. Degenerative changes are seen within the sacroiliac joints. No other fracture identified. L4 vertebroplasty has been performed. IMPRESSION: 1. Acute, impacted basicervical/intratrochanteric fracture of the left hip. Electronically Signed   By: Helyn Numbers M.D.   On: 05/06/2023 21:34      Time coordinating discharge: over 30 minutes  SIGNED:  Sunnie Nielsen DO [facility]

## 2023-05-12 NOTE — Plan of Care (Signed)
  Problem: Clinical Measurements: Goal: Ability to maintain clinical measurements within normal limits will improve Outcome: Progressing Goal: Diagnostic test results will improve Outcome: Progressing   Problem: Activity: Goal: Risk for activity intolerance will decrease Outcome: Progressing   Problem: Nutrition: Goal: Adequate nutrition will be maintained Outcome: Progressing   Problem: Elimination: Goal: Will not experience complications related to bowel motility Outcome: Progressing Goal: Will not experience complications related to urinary retention Outcome: Progressing   Problem: Pain Managment: Goal: General experience of comfort will improve Outcome: Progressing   Problem: Safety: Goal: Ability to remain free from injury will improve Outcome: Progressing

## 2023-05-12 NOTE — Care Management Important Message (Signed)
Important Message  Patient Details  Name: Rachael Humphrey MRN: [id number] Date of Birth: [birth year]   Medicare Important Message Given:  Yes  Patient is in an isolation room so I called her room [phone]) to review the Important Message from Medicare. In agreement with upcoming discharge.    Olegario Messier A Dilraj Killgore 05/12/2023, 10:04 AM

## 2023-06-18 ENCOUNTER — Other Ambulatory Visit: Payer: Self-pay | Admitting: Orthopedic Surgery

## 2023-06-18 ENCOUNTER — Ambulatory Visit
Admission: RE | Admit: 2023-06-18 | Discharge: 2023-06-18 | Disposition: A | Discharge status: Home / Self Care | Payer: Medicare Other | Source: Ambulatory Visit | Attending: Orthopedic Surgery | Admitting: Orthopedic Surgery

## 2023-06-18 DIAGNOSIS — M545 Low back pain, unspecified: Secondary | ICD-10-CM

## 2023-06-18 DIAGNOSIS — Z9889 Other specified postprocedural states: Secondary | ICD-10-CM | POA: Insufficient documentation

## 2023-06-22 ENCOUNTER — Other Ambulatory Visit: Payer: Self-pay | Admitting: Orthopedic Surgery

## 2023-06-22 ENCOUNTER — Other Ambulatory Visit: Payer: Self-pay | Admitting: Family Medicine

## 2023-06-22 DIAGNOSIS — M545 Low back pain, unspecified: Secondary | ICD-10-CM

## 2023-06-22 DIAGNOSIS — S32010G Wedge compression fracture of first lumbar vertebra, subsequent encounter for fracture with delayed healing: Secondary | ICD-10-CM

## 2023-06-22 DIAGNOSIS — Z9889 Other specified postprocedural states: Secondary | ICD-10-CM

## 2023-06-23 ENCOUNTER — Ambulatory Visit
Admission: RE | Admit: 2023-06-23 | Discharge: 2023-06-23 | Disposition: A | Discharge status: Home / Self Care | Payer: Medicare Other | Source: Ambulatory Visit | Attending: Family Medicine | Admitting: Family Medicine

## 2023-06-23 DIAGNOSIS — S32010G Wedge compression fracture of first lumbar vertebra, subsequent encounter for fracture with delayed healing: Secondary | ICD-10-CM

## 2023-06-23 HISTORY — PX: IR RADIOLOGIST EVAL & MGMT: IMG5224

## 2023-06-23 NOTE — Consult Note (Signed)
Chief Complaint: Patient was seen in consultation today for back pain, L1 fracture at the request of McGhee,James E  Referring Physician(s): McGhee,James E  History of Present Illness: Rachael Humphrey is a 89 y.o. female With a history of multiple prior osteoporotic compression fractures status post cement augmentation.  Recent MRI imaging confirms prior kyphoplasty at T12, L3 and L4.  She reports a somewhat complicated history.  On 21 August she had a fall and sustained a left proximal femoral fracture for which she underwent ORIF on 05/07/2023.  She was subsequently discharged to rehab at [facility] on 27 August and stayed for nearly 1 month being discharged home on 06/07/2023.  Several days after being discharged home she was doing well and went shopping with her friend where she walked around for several hours.  When she woke up the next day, she could not get out of bed due to new onset severe back pain.  An MRI was performed on 06/18/2023 confirming a new acute fracture of the L1 vertebral body.  Her pain is quite severe.  She rates it a 10 out of 10 on a 10 point scale.  Additionally, her pain is very disabling.  She scored a perfect 24 out of 24 on the [employer] disability questionnaire.  She was barely able to come into the office today due to her severe pain and decreased mobility.  Prior to this, she was functioning and ambulating well.  She reports that she has had significant improvement in her symptoms in the past when she had kyphoplasty's years ago.  She is anxious to pursue any relief of her current symptoms.  She is currently wearing a back brace with minimal relief.  Past Medical History:  Diagnosis Date   Adenocarcinoma, colon (HCC) 2005   Biatrial enlargement    Chronic anticoagulation    Clopidogrel   Complication of anesthesia    Coronary artery disease    a.) 2.75 x 28 mm Vision BMS to pLAD on 02/23/2014   GERD (gastroesophageal reflux disease)    Grade II  diastolic dysfunction    H/O hiatal hernia    Hemorrhoids    internal-not a problem now.   History of kidney stones    multiple   History of methicillin resistant staphylococcus aureus (MRSA) 2020   History of shingles 04/18/2014   HLD (hyperlipidemia)    Hypertension    Hypothyroidism    Myocardial infarction (HCC)    PONV (postoperative nausea and vomiting)    Senile osteoporosis    T2DM (type 2 diabetes mellitus) (HCC)    TIA (transient ischemic attack)    Valvular regurgitation    panvalvular    Past Surgical History:  Procedure Laterality Date   APPENDECTOMY     AUGMENTATION MAMMAPLASTY Bilateral 1978   BREAST BIOPSY Right    over 30 years ago negative   BREAST BIOPSY Left    negative over 30 years ago   CATARACT EXTRACTION, BILATERAL     COLECTOMY     partial small and large instestine removed   CORONARY ANGIOPLASTY WITH STENT PLACEMENT N/A 02/23/2014   2.75 x 28 mm Vision BMS x 1 to pLAD   CYSTOSCOPY WITH RETROGRADE PYELOGRAM, URETEROSCOPY AND STENT PLACEMENT Bilateral 05/17/2014   Procedure: CYSTOSCOPY WITH RETROGRADE PYELOGRAM, URETEROSCOPY AND STENT PLACEMENT, BASKET STONE REMOVAL;  Surgeon: Sebastian Ache, MD;  Location: [facility];  Service: Urology;  Laterality: Bilateral;   INTRAMEDULLARY (IM) NAIL INTERTROCHANTERIC Left 05/07/2023   Procedure: INTRAMEDULLARY (IM)  NAIL INTERTROCHANTERIC;  Surgeon: Signa Kell, MD;  Location: [facility];  Service: Orthopedics;  Laterality: Left;   KYPHOPLASTY N/A 11/25/2018   Procedure: KYPHOPLASTY T11, DIABETIC;  Surgeon: Kennedy Bucker, MD;  Location: [facility];  Service: Orthopedics;  Laterality: N/A;   KYPHOPLASTY N/A 11/28/2020   Procedure: [id number]  USE VASCULAR BED;  Surgeon: Kennedy Bucker, MD;  Location: [facility];  Service: Orthopedics;  Laterality: N/A;   KYPHOPLASTY N/A 04/25/2021   Procedure: L 3 KYPHOPLASTY;  Surgeon: Kennedy Bucker, MD;  Location: [facility];  Service: Orthopedics;  Laterality: N/A;     Allergies: Codeine, Darvon [propoxyphene], Demerol [meperidine], Gabapentin, Sulfa antibiotics, Tramadol, Voltaren [diclofenac sodium], and Zinc  Medications: Prior to Admission medications   Medication Sig Start Date End Date Taking? Authorizing Provider  acetaminophen (TYLENOL) 500 MG tablet Take 2 tablets (1,000 mg total) by mouth every 8 (eight) hours as needed. 03/25/22  Yes Lynn Ito, MD  atorvastatin (LIPITOR) 20 MG tablet Take 20 mg by mouth daily.   Yes [provider]  bisacodyl (DULCOLAX) 10 MG suppository Place 1 suppository (10 mg total) rectally daily as needed for moderate constipation. 05/12/23  Yes Sunnie Nielsen, DO  Calcium Carb-Cholecalciferol (CALCIUM 600 + D PO) Take 1 tablet by mouth daily.   Yes [provider]  cetirizine (ZYRTEC) 10 MG tablet Take 10 mg by mouth daily. 07/18/16  Yes [provider]  clopidogrel (PLAVIX) 75 MG tablet Take 75 mg by mouth daily.  11/12/15  Yes [provider]  docusate sodium (COLACE) 100 MG capsule Take 1 capsule (100 mg total) by mouth 2 (two) times daily. 05/12/23  Yes Sunnie Nielsen, DO  feeding supplement (ENSURE ENLIVE / ENSURE PLUS) LIQD Take 237 mLs by mouth 2 (two) times daily between meals. 05/12/23  Yes Sunnie Nielsen, DO  fluticasone [facility]) 50 MCG/ACT nasal spray Place 2 sprays into both nostrils daily as needed for allergies.  11/12/15  Yes [provider]  levothyroxine (SYNTHROID, LEVOTHROID) 25 MCG tablet Take 25 mcg by mouth daily before breakfast.  12/10/16  Yes [provider]  methocarbamol (ROBAXIN) 500 MG tablet Take 1 tablet (500 mg total) by mouth every 6 (six) hours as needed for muscle spasms. 05/08/23  Yes Dedra Skeens, PA-C  metoprolol succinate (TOPROL-XL) 50 MG 24 hr tablet Take 75 mg by mouth every morning.    Yes [provider]  montelukast (SINGULAIR) 10 MG tablet Take 10 mg by mouth at bedtime.  07/18/16  Yes [provider]   Multiple Vitamin (MULTIVITAMIN WITH MINERALS) TABS tablet Take 1 tablet by mouth daily.   Yes [provider]  NIFEdipine (PROCARDIA-XL/NIFEDICAL-XL) 30 MG 24 hr tablet Take 30 mg by mouth daily. 11/06/18  Yes [provider]  ondansetron (ZOFRAN) 4 MG tablet Take 1 tablet (4 mg total) by mouth every 6 (six) hours as needed for nausea. 05/08/23  Yes Dedra Skeens, PA-C  oxyCODONE (OXY IR/ROXICODONE) 5 MG immediate release tablet Take 0.5-1 tablets (2.5-5 mg total) by mouth every 4 (four) hours as needed for moderate pain (pain score 4-6). 05/08/23  Yes Dedra Skeens, PA-C  pantoprazole (PROTONIX) 40 MG tablet Take 1 tablet by mouth 2 (two) times daily. 02/13/23  Yes [provider]  albuterol (VENTOLIN HFA) 108 (90 Base) MCG/ACT inhaler Inhale 1 puff into the lungs every 4 (four) hours as needed for wheezing or shortness of breath. Patient not taking: Reported on 06/23/2023 05/12/23   Sunnie Nielsen, DO  aspirin EC 325 MG tablet Take  1 tablet (325 mg total) by mouth daily. Patient not taking: Reported on 06/23/2023 05/08/23   Dedra Skeens, PA-C  guaiFENesin-dextromethorphan [facility] DM) 100-10 MG/5ML syrup Take 5 mLs by mouth every 4 (four) hours as needed for cough. Patient not taking: Reported on 06/23/2023 05/12/23   Sunnie Nielsen, DO     Family History  Problem Relation Age of Onset   Stroke Mother    Breast cancer Sister 62    Social History   Socioeconomic History   Marital status: Widowed    Spouse name: Not on file   Number of children: Not on file   Years of education: Not on file   Highest education level: Not on file  Occupational History   Not on file  Tobacco Use   Smoking status: Never   Smokeless tobacco: Never  Vaping Use   Vaping status: Never Used  Substance and Sexual Activity   Alcohol use: No   Drug use: No   Sexual activity: Yes  Other Topics Concern   Not on file  Social History Narrative   Lives alone but two sisters are close  by to help.   Social Determinants of Health   Financial Resource Strain: Not on file  Food Insecurity: No Food Insecurity (05/07/2023)   Hunger Vital Sign    Worried About Running Out of Food in the Last Year: Never true    Ran Out of Food in the Last Year: Never true  Transportation Needs: No Transportation Needs (05/07/2023)   PRAPARE - Administrator, Civil Service (Medical): No    Lack of Transportation (Non-Medical): No  Physical Activity: Not on file  Stress: Not on file  Social Connections: Not on file    Review of Systems: A 12 point ROS discussed and pertinent positives are indicated in the HPI above.  All other systems are negative.  Review of Systems  Vital Signs: BP 105/68 (BP Location: Left Arm, Patient Position: Sitting, Cuff Size: Normal)   Pulse 68   Temp 97.7 F (36.5 C) (Oral)   Resp 14   SpO2 98%    Physical Exam Constitutional:      General: She is not in acute distress.    Appearance: Normal appearance.  HENT:     Head: Normocephalic and atraumatic.  Eyes:     General: No scleral icterus. Cardiovascular:     Rate and Rhythm: Normal rate.  Pulmonary:     Effort: Pulmonary effort is normal.  Abdominal:     General: Abdomen is flat.     Palpations: Abdomen is soft.  Skin:    General: Skin is warm and dry.  Neurological:     Mental Status: She is alert and oriented to person, place, and time.  Psychiatric:        Mood and Affect: Mood normal.        Behavior: Behavior normal.      Imaging: MR LUMBAR SPINE WO CONTRAST  Result Date: 06/18/2023 CLINICAL DATA:  Acute midline back pain EXAM: MRI LUMBAR SPINE WITHOUT CONTRAST TECHNIQUE: Multiplanar, multisequence MR imaging of the lumbar spine was performed. No intravenous contrast was administered. COMPARISON:  MR L Spine 01/17/23 FINDINGS: Segmentation: In keeping with prior numbering convention, there is transitional anatomy with the last well-formed space at S1-S2 Alignment: Minimal  retrolisthesis of L1 on L2 and trace anterolisthesis of L3 on L4. Vertebrae: Redemonstrated are multiple compression deformities in the lumbar spine with kyphoplasty changes at T12, L3 and L4. Compared  to prior exam there is new bone marrow edema in the L1 vertebral body with a new inferior endplate compression deformity (series 6, image 8). Compared to prior exam there is new nonspecific T2 intermediate signal within the ventral epidural space at the L3 and L4 levels. While these findings could represent epidural blood products, further evaluation with a contrast-enhanced lumbar spine MRI is recommended to exclude the possibility of infection or a neoplastic process. Conus medullaris and cauda equina: Conus extends to the L1-L2 disc space level. Conus and cauda equina appear normal. Paraspinal and other soft tissues: Likely unchanged 11 mm T2 hyperintense left adrenal lesion. Disc levels: T12-L1: Mild bilateral facet degenerative change. Minimal disc bulge. Mild spinal canal narrowing. Mild bilateral neural foraminal narrowing. L1-L2: Mild bilateral facet degenerative change with ligamentum flavum hypertrophy. Minimal disc bulge. Mild-to-moderate spinal canal narrowing. Moderate bilateral neural foraminal narrowing, right-greater-than-left. L2-L3: Mild bilateral facet degenerative change. Mild spinal canal narrowing. Moderate bilateral neural foraminal narrowing. L3-L4: Moderate bilateral facet degenerative change. No significant disc bulge. No spinal canal narrowing. Moderate bilateral neural foraminal narrowing. L4-L5: Moderate to severe bilateral facet degenerative change. Ligamentum flavum hypertrophy. Circumferential disc bulge. Severe spinal canal narrowing. Severe right and moderate left neural foraminal narrowing. Findings are unchanged from prior exam. L5-S1: Moderate left and mild right facet degenerative change. No significant disc bulge. No spinal canal narrowing. Moderate left neural foraminal  narrowing. IMPRESSION: 1. Compared to prior exam there is new bone marrow edema in the L1 vertebral body with a new inferior endplate compression deformity. Findings are concerning for an acute/subacute compression fracture. 2. Compared to prior exam there is new nonspecific T2 intermediate signal within the ventral epidural space at the L3 and L4 levels. While these findings could represent epidural blood products, further evaluation with a contrast-enhanced lumbar spine MRI is recommended to exclude the possibility of infection or a neoplastic process. 3. Unchanged severe spinal canal narrowing at L4-L5 with severe right and moderate left neural foraminal narrowing. Electronically Signed   By: Lorenza Cambridge M.D.   On: 06/18/2023 19:45    Labs:  CBC: Recent Labs    05/08/23 0548 05/09/23 0536 05/10/23 0530 05/12/23 0513  WBC 9.7 8.9 8.3 6.9  HGB 11.2* 10.7* 10.8* 11.1*  HCT 32.4* 29.6* 29.9* 32.0*  PLT 128* 120* 145* 237    COAGS: No results for input(s): "INR", "APTT" in the last 8760 hours.  BMP: Recent Labs    05/06/23 2027 05/08/23 0548 05/09/23 0536 05/10/23 0530  NA 135 136 135 135  K 4.0 3.6 4.0 3.9  CL 106 99 104 104  CO2 21* 24 24 24   GLUCOSE 160* 124* 165* 147*  BUN 17 16 14 14   CALCIUM 8.3* 8.6* 8.9 8.8*  CREATININE 0.62 0.65 0.61 0.65  GFRNONAA >60 >60 >60 >60    LIVER FUNCTION TESTS: No results for input(s): "BILITOT", "AST", "ALT", "ALKPHOS", "PROT", "ALBUMIN" in the last 8760 hours.  TUMOR MARKERS: No results for input(s): "AFPTM", "CEA", "CA199", "CHROMGRNA" in the last 8760 hours.  Assessment & Plan:   Patient has suffered subacute osteoporotic fracture of the L1 vertebra.   History and exam have demonstrated the following:  Acute/Subacute fracture by imaging dated 06/18/23, Pain on exam concordant with level of fracture, Failure of conservative therapy and pain refractory to narcotic pain mediation, and Significant disability on the [employer]  Disability Questionnaire with 24/24 positive symptoms, reflecting significant impact/impairment of (ADLs)   ICD-10-CM Codes that Support Medical Necessity ([employer]at.[email or web address]?[email or web address])  M80.08XA  Age-related osteoporosis with current pathological fracture, vertebra(e), initial encounter for fracture   Plan:  L1 vertebral body augmentation with balloon kyphoplasty  Post-procedure disposition: outpatient.  Medication holds: Plavix  The patient has suffered a fracture of the L1 vertebral body. It is recommended that patients aged 19 years or older be evaluated for possible testing or treatment of osteoporosis. A copy of this consult report is sent to the patient's referring physician.  Advanced Care Plan: The patient did not want to provide an Advanced Care Plan at the time of this visit     Total time spent on today's visit was over 40 Minutes , including both face-to-face time and non face-to-face time, personally spent on review of chart (including labs and relevant imaging), discussing further workup and treatment options, referral to specialist if needed, reviewing outside records if pertinent, answering patient questions, and coordinating care regarding L1 osteoporotic compression fracture as well as management strategy.    Electronically Signed: Kandis Cocking Dmitri Pettigrew 06/23/2023, 1:45 PM

## 2023-06-24 ENCOUNTER — Other Ambulatory Visit: Payer: Self-pay | Admitting: Interventional Radiology

## 2023-06-24 DIAGNOSIS — M8008XA Age-related osteoporosis with current pathological fracture, vertebra(e), initial encounter for fracture: Secondary | ICD-10-CM

## 2023-06-24 LAB — CBC
HCT: 41.6 % (ref 35.0–45.0)
Hemoglobin: 13.8 g/dL (ref 11.7–15.5)
MCH: 32.6 pg (ref 27.0–33.0)
MCHC: 33.2 g/dL (ref 32.0–36.0)
MCV: 98.3 fL (ref 80.0–100.0)
MPV: 10.8 fL (ref 7.5–12.5)
Platelets: 236 10*3/uL (ref 140–400)
RBC: 4.23 10*6/uL (ref 3.80–5.10)
RDW: 13.2 % (ref 11.0–15.0)
WBC: 6.7 10*3/uL (ref 3.8–10.8)

## 2023-06-24 LAB — COMPLETE METABOLIC PANEL WITH GFR
AG Ratio: 1.8 (calc) (ref 1.0–2.5)
ALT: 17 U/L (ref 6–29)
AST: 16 U/L (ref 10–35)
Albumin: 4.2 g/dL (ref 3.6–5.1)
Alkaline phosphatase (APISO): 120 U/L (ref 37–153)
BUN: 15 mg/dL (ref 7–25)
CO2: 24 mmol/L (ref 20–32)
Calcium: 10.2 mg/dL (ref 8.6–10.4)
Chloride: 104 mmol/L (ref 98–110)
Creat: 0.74 mg/dL (ref 0.60–0.95)
Globulin: 2.3 g/dL (ref 1.9–3.7)
Glucose, Bld: 124 mg/dL — ABNORMAL HIGH (ref 65–99)
Potassium: 4.3 mmol/L (ref 3.5–5.3)
Sodium: 137 mmol/L (ref 135–146)
Total Bilirubin: 0.5 mg/dL (ref 0.2–1.2)
Total Protein: 6.5 g/dL (ref 6.1–8.1)
eGFR: 76 mL/min/{1.73_m2} (ref >=60)

## 2023-06-24 LAB — PROTIME-INR
INR: 1.1
Prothrombin Time: 11.4 s (ref 9.0–11.5)

## 2023-06-29 NOTE — Discharge Instructions (Signed)
Kyphoplasty Post Procedure Discharge Instructions  May resume a regular diet and any medications that you routinely take (including pain medications). However, if you are taking Aspirin or an anticoagulant/blood thinner you will be told when you can resume taking these by the healthcare provider. No driving day of procedure. The day of your procedure take it easy. You may use an ice pack as needed to injection sites on back.  Ice to back 30 minutes on and 30 minutes off, as needed. May remove bandaids tomorrow after taking a shower. Replace daily with a clean bandaid until healed.  Do not lift anything heavier than a milk jug for 1-2 weeks or determined by your physician.  Follow up with your physician in 2 weeks.    Please contact our office at [phone] for the following symptoms or if you have any questions:  Fever greater than 100 degrees Increased swelling, pain, or redness at injection site. Increased back and/or leg pain New numbness or change in symptoms from before the procedure.    Thank you for visiting [facility].  May resume Plavix immediately after procedure.

## 2023-06-30 ENCOUNTER — Ambulatory Visit
Admission: RE | Admit: 2023-06-30 | Discharge: 2023-06-30 | Disposition: A | Discharge status: Home / Self Care | Payer: Medicare Other | Source: Ambulatory Visit | Attending: Interventional Radiology | Admitting: Interventional Radiology

## 2023-06-30 DIAGNOSIS — M8008XA Age-related osteoporosis with current pathological fracture, vertebra(e), initial encounter for fracture: Secondary | ICD-10-CM

## 2023-06-30 HISTORY — PX: IR KYPHO LUMBAR INC FX REDUCE BONE BX UNI/BIL CANNULATION INC/IMAGING: IMG5519

## 2023-06-30 MED ORDER — MIDAZOLAM HCL 2 MG/2ML IJ SOLN
1.0000 mg | INTRAMUSCULAR | Status: DC | PRN
  Administered 2023-06-30 (×4): 0.5 mg via INTRAVENOUS

## 2023-06-30 MED ORDER — ACETAMINOPHEN 10 MG/ML IV SOLN
1000.0000 mg | Freq: Once | INTRAVENOUS | Status: AC
  Administered 2023-06-30: 1000 mg via INTRAVENOUS

## 2023-06-30 MED ORDER — SODIUM CHLORIDE 0.9 % IV SOLN: INTRAVENOUS | Status: DC

## 2023-06-30 MED ORDER — CEFAZOLIN SODIUM-DEXTROSE 2-4 GM/100ML-% IV SOLN
2.0000 g | INTRAVENOUS | Status: AC
  Administered 2023-06-30: 2 g via INTRAVENOUS

## 2023-06-30 MED ORDER — FENTANYL CITRATE PF 50 MCG/ML IJ SOSY
25.0000 ug | PREFILLED_SYRINGE | INTRAMUSCULAR | Status: DC | PRN
  Administered 2023-06-30 (×4): 25 ug via INTRAVENOUS

## 2023-06-30 MED ORDER — ONDANSETRON HCL 4 MG/2ML IJ SOLN
4.0000 mg | Freq: Once | INTRAMUSCULAR | Status: AC
  Administered 2023-06-30: 4 mg via INTRAVENOUS

## 2023-06-30 NOTE — Progress Notes (Signed)
Pt back in nursing recovery area. Pt still drowsy from procedure but will wake up when spoken to. Pt follows commands, talks in complete sentences and has no complaints at this time. Pt will remain in nurses station until discharged by Radiologist.

## 2023-07-07 ENCOUNTER — Telehealth: Payer: Self-pay

## 2023-07-07 NOTE — Telephone Encounter (Signed)
Phone call to pt to follow up from her kyphoplasty on 06/30/23. Pt reports her pain is completely gone post procedure but is just having a little stiffness. Pt reports she is able to move around a little better. Pt denies any signs of infection, redness at the site, draining or fever. Pt has no complaints at this time and will be scheduled for a telephone follow up with Dr. Juliette Alcide next week. Pt advised to call back if anything were to change or any concerns arise and we will arrange an in person appointment. Pt verbalized understanding.

## 2023-07-08 ENCOUNTER — Other Ambulatory Visit: Payer: Self-pay | Admitting: Interventional Radiology

## 2023-07-08 DIAGNOSIS — S32000A Wedge compression fracture of unspecified lumbar vertebra, initial encounter for closed fracture: Secondary | ICD-10-CM

## 2023-07-14 ENCOUNTER — Ambulatory Visit
Admission: RE | Admit: 2023-07-14 | Discharge: 2023-07-14 | Disposition: A | Discharge status: Home / Self Care | Payer: Medicare Other | Source: Ambulatory Visit | Attending: Interventional Radiology

## 2023-07-14 DIAGNOSIS — S32000A Wedge compression fracture of unspecified lumbar vertebra, initial encounter for closed fracture: Secondary | ICD-10-CM

## 2023-07-14 HISTORY — PX: IR RADIOLOGIST EVAL & MGMT: IMG5224

## 2023-07-14 NOTE — Progress Notes (Signed)
IR brief note   Patient was seen today in telephone visit for routine follow-up after L1 kyphoplasty performed on June 30, 2023.  The patient is now 2 weeks status post kyphoplasty.  She reports that her severe lumbar pain has essentially completely resolved.  She is now slowly working on becoming ambulatory again, with assistance of the walker.  She feels it is slowly getting better, and is now no longer requiring pain medication or is wearing a brace.  Of note, she also recovering from a left hip fracture.    The patient is doing well after L1 kyphoplasty.  She may continue to follow-up with [place] as needed.   Rachael Bass, MD  Vascular and Interventional Radiology 07/14/2023 4:36 PM
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
## 2024-08-29 ENCOUNTER — Encounter (HOSPITAL_COMMUNITY): Payer: Self-pay

## 2024-08-29 ENCOUNTER — Emergency Department

## 2024-08-29 ENCOUNTER — Emergency Department
Admission: EM | Admit: 2024-08-29 | Discharge: 2024-08-29 | Disposition: A | Discharge status: Other Acute Inpatient Hospital | Attending: Emergency Medicine | Admitting: Emergency Medicine

## 2024-08-29 ENCOUNTER — Other Ambulatory Visit: Payer: Self-pay

## 2024-08-29 DIAGNOSIS — I7121 Aneurysm of the ascending aorta, without rupture: Secondary | ICD-10-CM

## 2024-08-29 DIAGNOSIS — N200 Calculus of kidney: Secondary | ICD-10-CM | POA: Diagnosis not present

## 2024-08-29 DIAGNOSIS — E119 Type 2 diabetes mellitus without complications: Secondary | ICD-10-CM | POA: Diagnosis not present

## 2024-08-29 DIAGNOSIS — Z7902 Long term (current) use of antithrombotics/antiplatelets: Secondary | ICD-10-CM | POA: Diagnosis not present

## 2024-08-29 DIAGNOSIS — I10 Essential (primary) hypertension: Secondary | ICD-10-CM | POA: Diagnosis not present

## 2024-08-29 DIAGNOSIS — K219 Gastro-esophageal reflux disease without esophagitis: Secondary | ICD-10-CM | POA: Diagnosis not present

## 2024-08-29 DIAGNOSIS — S0083XA Contusion of other part of head, initial encounter: Secondary | ICD-10-CM | POA: Diagnosis not present

## 2024-08-29 DIAGNOSIS — W010XXA Fall on same level from slipping, tripping and stumbling without subsequent striking against object, initial encounter: Secondary | ICD-10-CM | POA: Insufficient documentation

## 2024-08-29 DIAGNOSIS — R590 Localized enlarged lymph nodes: Secondary | ICD-10-CM | POA: Diagnosis not present

## 2024-08-29 DIAGNOSIS — I6523 Occlusion and stenosis of bilateral carotid arteries: Secondary | ICD-10-CM | POA: Insufficient documentation

## 2024-08-29 DIAGNOSIS — S0990XA Unspecified injury of head, initial encounter: Secondary | ICD-10-CM | POA: Diagnosis present

## 2024-08-29 DIAGNOSIS — K449 Diaphragmatic hernia without obstruction or gangrene: Secondary | ICD-10-CM | POA: Diagnosis not present

## 2024-08-29 DIAGNOSIS — K5730 Diverticulosis of large intestine without perforation or abscess without bleeding: Secondary | ICD-10-CM | POA: Insufficient documentation

## 2024-08-29 DIAGNOSIS — E041 Nontoxic single thyroid nodule: Secondary | ICD-10-CM | POA: Insufficient documentation

## 2024-08-29 DIAGNOSIS — I7101 Dissection of ascending aorta: Secondary | ICD-10-CM | POA: Insufficient documentation

## 2024-08-29 DIAGNOSIS — I2510 Atherosclerotic heart disease of native coronary artery without angina pectoris: Secondary | ICD-10-CM | POA: Diagnosis not present

## 2024-08-29 DIAGNOSIS — Z8673 Personal history of transient ischemic attack (TIA), and cerebral infarction without residual deficits: Secondary | ICD-10-CM | POA: Diagnosis not present

## 2024-08-29 DIAGNOSIS — R079 Chest pain, unspecified: Secondary | ICD-10-CM | POA: Diagnosis present

## 2024-08-29 LAB — URINALYSIS, W/ REFLEX TO CULTURE (INFECTION SUSPECTED)
Bilirubin Urine: NEGATIVE
Glucose, UA: 150 mg/dL — AB
Hgb urine dipstick: NEGATIVE
Ketones, ur: NEGATIVE mg/dL
Nitrite: NEGATIVE
Protein, ur: NEGATIVE mg/dL
Specific Gravity, Urine: >1.046 — ABNORMAL HIGH (ref 1.005–1.030)
Squamous Epithelial / HPF: 0 /HPF (ref 0–5)
pH: 6 (ref 5.0–8.0)

## 2024-08-29 LAB — BASIC METABOLIC PANEL WITH GFR
Anion gap: 11 (ref 5–15)
BUN: 16 mg/dL (ref 8–23)
CO2: 24 mmol/L (ref 22–32)
Calcium: 9.5 mg/dL (ref 8.9–10.3)
Chloride: 103 mmol/L (ref 98–111)
Creatinine, Ser: 0.69 mg/dL (ref 0.44–1.00)
GFR, Estimated: >60 mL/min (ref >60)
Glucose, Bld: 250 mg/dL — ABNORMAL HIGH (ref 70–99)
Potassium: 4.6 mmol/L (ref 3.5–5.1)
Sodium: 138 mmol/L (ref 135–145)

## 2024-08-29 LAB — TROPONIN T, HIGH SENSITIVITY
Troponin T High Sensitivity: 21 ng/L — ABNORMAL HIGH (ref 0–19)
Troponin T High Sensitivity: 18 ng/L (ref 0–19)

## 2024-08-29 LAB — CBC
HCT: 40 % (ref 36.0–46.0)
Hemoglobin: 13.7 g/dL (ref 12.0–15.0)
MCH: 32.3 pg (ref 26.0–34.0)
MCHC: 34.3 g/dL (ref 30.0–36.0)
MCV: 94.3 fL (ref 80.0–100.0)
Platelets: 174 K/uL (ref 150–400)
RBC: 4.24 MIL/uL (ref 3.87–5.11)
RDW: 13 % (ref 11.5–15.5)
WBC: 9.3 K/uL (ref 4.0–10.5)
nRBC: 0 % (ref 0.0–0.2)

## 2024-08-29 LAB — CK: Total CK: 71 U/L (ref 38–234)

## 2024-08-29 MED ORDER — CLEVIDIPINE BUTYRATE 0.5 MG/ML IV EMUL
0.0000 mg/h | INTRAVENOUS | Status: DC
  Administered 2024-08-29: 20:00:00 2 mg/h via INTRAVENOUS
  Filled 2024-08-29: qty 50

## 2024-08-29 MED ORDER — ASPIRIN 81 MG PO CHEW
324.0000 mg | CHEWABLE_TABLET | Freq: Once | ORAL | Status: DC
  Filled 2024-08-29: qty 4

## 2024-08-29 MED ORDER — IOHEXOL 350 MG/ML SOLN
100.0000 mL | Freq: Once | INTRAVENOUS | Status: AC | PRN
  Administered 2024-08-29: 19:00:00 100 mL via INTRAVENOUS

## 2024-08-29 MED ORDER — SODIUM CHLORIDE 0.9 % IV BOLUS
500.0000 mL | Freq: Once | INTRAVENOUS | Status: AC
  Administered 2024-08-29: 16:00:00 500 mL via INTRAVENOUS

## 2024-08-29 MED ORDER — ACETAMINOPHEN 500 MG PO TABS
1000.0000 mg | ORAL_TABLET | Freq: Once | ORAL | Status: AC
  Administered 2024-08-29: 18:00:00 1000 mg via ORAL
  Filled 2024-08-29: qty 2

## 2024-08-29 MED ORDER — ONDANSETRON HCL 4 MG/2ML IJ SOLN
4.0000 mg | Freq: Once | INTRAMUSCULAR | Status: AC
  Administered 2024-08-29: 18:00:00 4 mg via INTRAVENOUS
  Filled 2024-08-29: qty 2

## 2024-08-29 MED ORDER — NICARDIPINE HCL IN NACL 20-0.86 MG/200ML-% IV SOLN
3.0000 mg/h | INTRAVENOUS | Status: DC
  Administered 2024-08-29: 21:00:00 5 mg/h via INTRAVENOUS
  Filled 2024-08-29: qty 200

## 2024-08-29 NOTE — ED Notes (Signed)
 [facility] providing transport will arrive in 20 mints

## 2024-08-29 NOTE — Progress Notes (Signed)
 ------------------------------------------------------------------------------- Attestation signed by Rolan Cleatus Carrier, MD at 08/29/24 2323 Attending Attestation:   I evaluated this patient and discussed the patient's management with the physician assistant. I reviewed the physician assistant's note and agree with the documented findings and plan of care. Case was discussed with primary surgical team who agrees with our critical care plan.  This patient required ICU level of care during my evaluation due to acute type A aortic dissection, and hypertension.  The nature of the treatment and management provided by the teaching physician (myself) to manage the critically ill patient includes direct patient care, patient reassessment, coordination of patient care, interpretation of data, review of medical records including radiographs, cultures and labs, and documentation of patient care. Additional care provided included family discussion and management of hypertension.  Total critical care time, excluding procedures, was 45 minutes.  Cleatus PARAS. Rolan Hein[facility] Critical Care Anesthesiology  -------------------------------------------------------------------------------   CVT ICU CRITICAL CARE ADMISSION NOTE   Date of Service: 08/29/2024   Hospital Day: LOS: 0 days       Surgery Date: TBD Surgical Attending: Horris Clemmie Pock, MD   Critical Care Attending: Cleatus Rolan, MBChB  Interval History:  Patient arrived to CTCCU from OSH with c/f type A aortic dissection. On impulse control, requiring clevidipine  to achieve SBP <145mmHg and HR is <70 without drips. Plan pending SRS evaluation and conversation with family. Multiple family members bedside.   History of Present Illness:  Rachael Humphrey is a 89 y.o. female hx of CAD, HTN, HLD, DM, TIA, GERD, bilat carotid artery stenosis   Hospital Course: 12/15: OSH transfer type A  Principal Problem:   Ascending aortic aneurysm Active  Problems:   Hypertension   ASSESSMENT & PLAN:   Cardiovascular: Type A dissection, hx of CAD, HTN, HLD, bilat carotid artery stenosis - Goal SBP <156mmHg, HR <70  - Clevidipine  titrated to achieve SBP goals  - Home Plavix  and ASA (held)  - Home nifedipine  (held)  - Home metop (held)   Respiratory:  - Goal SpO2 >92%, wean oxygen as tolerated - Aggressive pulmonary hygiene, encourage IS  Neurologic: Hx of TIA - Pain:  - Sedation: None  Renal/Genitourinary:  - Replete electrolytes PRN - Strict I&O  Gastrointestinal: Hx of GERD and hiatal hernia  - Diet: NPO - GI prophylaxis: Protonix  (home med) - Bowel regimen: None  Endocrine: Hx of T2DM, hypothyroidism - Glycemic Control: CTM  Hematologic:  - Monitor CBC daily, transfuse products as indicated - Monitor chest tube output  Immunologic/Infectious Disease: - Afebrile, leukocytosis likely secondary to post-operative inflammatory state, continue to monitor - Cultures: None  Integumentary - Skin bundle discussed on AM rounds? Yes - Pressure injury present on admission to CVTICU? No - Is CWOCN consulted? NA - Are any CWOCN verified pressure injuries present since admission to CVTICU? No  Mobility  Mobility level 1. HOB elevated to 30* for 1 hour (2x per shift)  PT/OT consulted No.  Daily Care Checklist:  - Stress Ulcer Prevention: Yes: Home use continued - DVT Prophylaxis: Mechanical: Yes. - HOB >30 degrees: Yes  - Daily Awakening: N/A - Spontaneous Breathing Trial: N/A - Indication for Beta Blockade: No - Indication for Central/PICC Line: No - Indication for Urinary Catheter: No  - Diagnostic images/reports of past 24hrs reviewed: Yes  Disposition:  - Continue ICU care  Rachael Humphrey is critically ill due to: Type A aortic dissection requiring impulse control with vasoactive infusions, frequent assessments, and hemodynamic monitoring.  This critical care time includes  examining the patient, evaluating the  hemodynamic, laboratory, and radiographic data, independently developing a comprehensive management plan, and serially assessing the patient's response to these critical care interventions. This critical care time excludes procedures.  Critical care time: 60 minutes   Rachael Gabriella Couse, PA  Cardiovascular and Thoracic Intensive Care Unit Department of Anesthesiology [employer]  [facility]   SUBJECTIVE:    Asking to speak with the doctor, endorsing pain under right breast after fall   OBJECTIVE:   Physical Exam: Constitutional: Elderly female, sitting in bed, NAD Neurologic: Awake, alert, oriented x4, MAE to command Respiratory: NWOB on RA, CTAB Cardiovascular: RRR, S1/S2, +DPs palpable  Gastrointestinal: Soft, non-distended Musculoskeletal:  Skin: Warm and dry, ecchymosis to face  Temp:  [36.7 C (98.1 F)] 36.7 C (98.1 F) Pulse:  [63] 63 SpO2 Pulse:  [61] 61 Resp:  [18] 18 BP: (111)/(64) 111/64 MAP (mmHg):  [79] 79 SpO2:  [94 %] 94 %    Recent Laboratory Results:No results for input(s): SPECTYPEART, PHART, PCO2ART, PO2ART, HCO3ART, BEART, O2SATART in the last 24 hours. No results for input(s): NA, K, CL, CO2, BUN, CREATININE, GFR, GLU in the last 168 hours. No results found for: BILITOT, BILIDIR, ALT, AST, GGT, ALKPHOS, PROT, ALBUMIN No results for input(s): POCGLU in the last 24 hours. No results for input(s): WBC, RBC, HGB, HCT, MCV, MCH, MCHC, RDW, PLT, MPV in the last 168 hours.  Invalid input(s): ADJUSTEDWBC No results for input(s): INR, APTT in the last 24 hours.  Invalid input(s): PTPATIENT  Lines & Tubes:  Patient Lines/Drains/Airways Status     Active Peripheral & Central Intravenous Access     Name Placement date Placement time Site Days   Peripheral IV 08/29/24 Right Antecubital 08/29/24  1900  Antecubital  less than 1   Peripheral IV 08/29/24 Left  Antecubital 08/29/24  1900  Antecubital  less than 1             Patient Lines/Drains/Airways Status     Active Wounds     None              Respiratory/ventilator settings for last 24 hours:     Intake/Output last 3 shifts: No intake/output data recorded.  Daily/Recent Weight:    BMI: There is no height or weight on file to calculate BMI.                     Medical History: Past Medical History[1] Past Surgical History[2] Scheduled Medications: Scheduled Medications[3] Continuous Infusions: Infusions Meds[4] PRN Medications: PRN Medications[5]        [1] No past medical history on file. [2] No past surgical history on file. [3] [4]  clevidipine  0 mg/hr (08/29/24 2212)   sodium chloride  10 mL/hr (08/29/24 2228)  [5] acetaminophen 

## 2024-08-29 NOTE — ED Notes (Signed)
 Pt c/o continued chest pain, continued nausea. Dr Suzanne notified, orders placed.

## 2024-08-29 NOTE — ED Notes (Signed)
[facility]  CALLED 

## 2024-08-29 NOTE — ED Triage Notes (Signed)
 Pt to ED [facility] from home for fall yesterday. Bruising to right face. +plavix . Unknown LOC. CP today, hx MI. fentanyl , 4mg  zofran  PTA. 88% on RA, placed on 2 L EMS. C/o pain everywhere

## 2024-08-29 NOTE — ED Notes (Signed)
 [facility]/Spoke to REP-Deanna request Cardiac Thoracic per MD Mumma/ Per Deanna Cardiac Thoracic at capacity; will continue consult with Trauma MD.

## 2024-08-29 NOTE — ED Notes (Signed)
[facility]  CALLED  PER  DR  [facility]  MD

## 2024-08-29 NOTE — ED Notes (Deleted)
[facility]  CALLED  PER  DR  [facility]  MD

## 2024-08-29 NOTE — ED Provider Notes (Signed)
 [facility] Provider Note    Event Date/Time   First MD Initiated Contact with Patient 08/29/24 1508     (approximate)   History   Fall and Chest Pain   HPI  Rachael Humphrey is a 89 y.o. female past medical history significant for CAD, hypertension, hyperlipidemia, diabetes, bilateral carotid artery stenosis, history of TIA, GERD, shortness of breath, who presents to the emergency department with fall and chest pain.  Patient is that she is uncertain of the events that led to her fall yesterday but states that she tripped and fell whenever she got home yesterday around 3:30 in the afternoon.  Called her sister and states that she was able to get herself up and clean herself up at that time.  States today that she just feels very weak since that time and feels weak all over.  Family members came to check on her today and she was complaining of feeling very tired, weak all over, had obvious head trauma and was complaining of chest pain and pain all over.  She was concerned that she was having a heart attack because sharp stabbing pain to her chest and her back.  No significant shortness of breath.  She is on certain if she hit her chest wall or her back whenever she had the fall.  Uncertain if she lost consciousness and does not know what caused her fall.  Takes Plavix  but no anticoagulation.  States today she has multiple episodes of nausea but no episodes of vomiting.  Just feels very weak and nauseous.  Denies dysuria, urinary urgency or frequency.  Patient received IV 100 mcg of fentanyl , 4 mg of Zofran   On chart review patient had a recent cardiology appointment with Dr. Lera on 12//2025.  Did not have any new cardiac concerns at that time.  Some fatigue and had been stumbling with no serious falls.  Nuclear medicine stress test done in 2019 that was ultimately read as normal.     Physical Exam   Triage Vital Signs: ED Triage Vitals [08/29/24 1508]   Encounter Vitals Group     BP      Girls Systolic BP Percentile      Girls Diastolic BP Percentile      Boys Systolic BP Percentile      Boys Diastolic BP Percentile      Pulse      Resp      Temp      Temp src      SpO2      Weight 121 lb 4.1 oz (55 kg)     Height 5' 1 (1.549 m)     Head Circumference      Peak Flow      Pain Score 5     Pain Loc      Pain Education      Exclude from Growth Chart     Most recent vital signs: Vitals:   08/29/24 1511  BP: (!) 162/92  Pulse: 64  Resp: 16  Temp: 97.9 F (36.6 C)  SpO2: 98%    Physical Exam Constitutional:      Appearance: She is well-developed.  HENT:     Head:     Comments: Significant ecchymosis to the right side of her face Eyes:     Extraocular Movements: Extraocular movements intact.     Conjunctiva/sclera: Conjunctivae normal.     Pupils: Pupils are equal, round, and reactive to light.  Cardiovascular:  Rate and Rhythm: Regular rhythm.     Pulses:          Dorsalis pedis pulses are 2+ on the right side and 2+ on the left side.       Posterior tibial pulses are 2+ on the right side and 2+ on the left side.     Heart sounds: Normal heart sounds.  Pulmonary:     Effort: Pulmonary effort is normal. No respiratory distress.     Comments: 88% on room air, placed on 2 L nasal cannula Abdominal:     General: There is no distension.  Musculoskeletal:        General: Normal range of motion.     Cervical back: Normal range of motion.     Right lower leg: No edema.     Left lower leg: No tenderness. No edema.     Comments: Midline thoracic tenderness to palpation.  No lumbar tenderness to palpation, no tenderness to bilateral hips or pelvis.  Skin:    General: Skin is warm.     Capillary Refill: Capillary refill takes less than 2 seconds.  Neurological:     General: No focal deficit present.     Mental Status: She is alert. Mental status is at baseline.     IMPRESSION / MDM / ASSESSMENT AND PLAN / ED  COURSE  I reviewed the triage vital signs and the nursing notes.  Differential diagnosis including intracranial hemorrhage, subdural, ACS, anemia, pneumonia, rib fracture, rib contusion, infectious process, urinary tract infection, pneumonia, dehydration   EKG  I, Clotilda Punter, the attending physician, personally viewed and interpreted this ECG.  EKG showed normal sinus rhythm.  Heart rate of 59.  Narrow complex.  Prolonged PR interval.  No significant ST elevation or depression.  No findings of acute ischemia or dysrhythmia.  Bradycardic with heart rates in the 40s and 50s while on cardiac telemetry.  RADIOLOGY I independently reviewed imaging, my interpretation of imaging: CT scan of the head -no acute finding  CT scan cervical spine and thoracic spine -no acute findings  CT scan chest -received a phone call from radiologist and discussed the results.  Concern for hyperdensity to the entire thoracic aorta concerning for acute intramural hematoma, ascending aorta is dilated to 4.6.  Questionable hematoma pericardium.  LABS (all labs ordered are listed, but only abnormal results are displayed) Labs interpreted as -    Labs Reviewed  BASIC METABOLIC PANEL WITH GFR - Abnormal; Notable for the following components:      Result Value   Glucose, Bld 250 (*)    All other components within normal limits  TROPONIN T, HIGH SENSITIVITY - Abnormal; Notable for the following components:   Troponin T High Sensitivity 21 (*)    All other components within normal limits  CBC  CK  URINALYSIS, W/ REFLEX TO CULTURE (INFECTION SUSPECTED)  TROPONIN T, HIGH SENSITIVITY     MDM   Clinical Course as of 08/29/24 1931  Mon Aug 29, 2024  8182 Received a phone call from radiologist to discuss patient CT scan noncontrasted chest study.  Concern for dilated aortic aneurysm with questionable acute hematoma in the aorta and concern for possible pericardial effusion but could be consistent with  hemopericardium.  Immediately discussed the results with Dr. Jama with vascular surgery who recommended transfer to [facility] or tertiary care center where they had cardiothoracic surgery.  Reaching out to [facility] for transfer. Called back to CT scan and taken back for  CTA chest abdomen pelvis NPO [SM]  1829 Called and discussed with cardiothoracic surgery at [facility] Dr. Michiel, felt that it would likely be nonoperative given her age.  Recommended that I consult critical care and call them back once the CTA dissection protocol was done. [SM]  1930 Called from radiology for CTA with type a dissection [SM]    Clinical Course User Index [SM] Suzanne Kirsch, MD     PROCEDURES:  Critical Care performed: yes  .Critical Care  Performed by: Suzanne Kirsch, MD Authorized by: Suzanne Kirsch, MD   Critical care provider statement:    Critical care time (minutes):  45   Critical care time was exclusive of:  Separately billable procedures and treating other patients   Critical care was necessary to treat or prevent imminent or life-threatening deterioration of the following conditions:  Cardiac failure   Critical care was time spent personally by me on the following activities:  Development of treatment plan with patient or surrogate, discussions with consultants, evaluation of patient's response to treatment, examination of patient, ordering and review of laboratory studies, ordering and review of radiographic studies, ordering and performing treatments and interventions, pulse oximetry, re-evaluation of patient's condition and review of old charts   Care discussed with: accepting provider at another facility     Patient's presentation is most consistent with acute presentation with potential threat to life or bodily function.   MEDICATIONS ORDERED IN ED: Medications  iohexol  (OMNIPAQUE ) 350 MG/ML injection 100 mL (has no administration in time range)  sodium chloride  0.9 % bolus 500 mL  (0 mLs Intravenous Stopped 08/29/24 1718)  ondansetron  (ZOFRAN ) injection 4 mg (4 mg Intravenous Given 08/29/24 1732)  acetaminophen  (TYLENOL ) tablet 1,000 mg (1,000 mg Oral Given 08/29/24 1737)    FINAL CLINICAL IMPRESSION(S) / ED DIAGNOSES   Final diagnoses:  Ascending aortic aneurysm, unspecified whether ruptured     Rx / DC Orders   ED Discharge Orders     None        Note:  This document was prepared using Dragon voice recognition software and may include unintentional dictation errors.   Suzanne Kirsch, MD 08/29/24 [phone]

## 2024-08-29 NOTE — ED Notes (Signed)
 CT SCAN  AND  XRAY  [facility]  WITH  [facility]

## 2024-08-29 NOTE — ED Notes (Signed)
 [facility]/ [place]/ Bed Assign: [id number] [phone]

## 2024-08-31 ENCOUNTER — Other Ambulatory Visit: Payer: Self-pay | Admitting: Internal Medicine

## 2024-08-31 MED ORDER — OXYCODONE HCL 5 MG/5ML PO SOLN: 2.5000 mg | ORAL | 0 refills | Status: AC | PRN

## 2024-09-15 DEATH — deceased
